# Patient Record
Sex: Female | Born: 1950 | Race: Black or African American | Hispanic: No | Smoking: Never smoker
Health system: Southern US, Community
[De-identification: ages and names within clinical notes are randomized; demographics above are authoritative.]

## PROBLEM LIST (undated history)

## (undated) DIAGNOSIS — N7011 Chronic salpingitis: Secondary | ICD-10-CM

## (undated) DIAGNOSIS — M329 Systemic lupus erythematosus, unspecified: Secondary | ICD-10-CM

## (undated) DIAGNOSIS — I7300 Raynaud's syndrome without gangrene: Secondary | ICD-10-CM

## (undated) DIAGNOSIS — G4700 Insomnia, unspecified: Secondary | ICD-10-CM

## (undated) DIAGNOSIS — M810 Age-related osteoporosis without current pathological fracture: Secondary | ICD-10-CM

## (undated) DIAGNOSIS — M4800 Spinal stenosis, site unspecified: Secondary | ICD-10-CM

## (undated) DIAGNOSIS — IMO0002 Reserved for concepts with insufficient information to code with codable children: Secondary | ICD-10-CM

## (undated) DIAGNOSIS — K589 Irritable bowel syndrome without diarrhea: Secondary | ICD-10-CM

## (undated) DIAGNOSIS — B009 Herpesviral infection, unspecified: Secondary | ICD-10-CM

## (undated) DIAGNOSIS — I10 Essential (primary) hypertension: Secondary | ICD-10-CM

## (undated) DIAGNOSIS — R7303 Prediabetes: Secondary | ICD-10-CM

## (undated) HISTORY — PX: FOOT SURGERY: SHX648

## (undated) HISTORY — DX: Reserved for concepts with insufficient information to code with codable children: IMO0002

## (undated) HISTORY — DX: Chronic salpingitis: N70.11

## (undated) HISTORY — DX: Essential (primary) hypertension: I10

## (undated) HISTORY — DX: Raynaud's syndrome without gangrene: I73.00

## (undated) HISTORY — DX: Systemic lupus erythematosus, unspecified: M32.9

## (undated) HISTORY — DX: Irritable bowel syndrome, unspecified: K58.9

## (undated) HISTORY — DX: Prediabetes: R73.03

## (undated) HISTORY — DX: Spinal stenosis, site unspecified: M48.00

## (undated) HISTORY — DX: Insomnia, unspecified: G47.00

## (undated) HISTORY — DX: Herpesviral infection, unspecified: B00.9

## (undated) HISTORY — DX: Age-related osteoporosis without current pathological fracture: M81.0

---

## 1976-07-21 HISTORY — PX: TUBAL LIGATION: SHX77

## 1985-07-21 HISTORY — PX: OTHER SURGICAL HISTORY: SHX169

## 1985-07-21 HISTORY — PX: ABDOMINAL HYSTERECTOMY: SHX81

## 1998-01-08 ENCOUNTER — Other Ambulatory Visit: Admission: RE | Admit: 1998-01-08 | Discharge: 1998-01-08 | Payer: Self-pay | Admitting: Obstetrics and Gynecology

## 1998-03-27 ENCOUNTER — Ambulatory Visit (HOSPITAL_COMMUNITY): Admission: RE | Admit: 1998-03-27 | Discharge: 1998-03-27 | Payer: Self-pay | Admitting: Internal Medicine

## 1998-05-11 ENCOUNTER — Ambulatory Visit (HOSPITAL_COMMUNITY): Admission: RE | Admit: 1998-05-11 | Discharge: 1998-05-11 | Payer: Self-pay | Admitting: Internal Medicine

## 1998-09-27 ENCOUNTER — Other Ambulatory Visit: Admission: RE | Admit: 1998-09-27 | Discharge: 1998-09-27 | Payer: Self-pay | Admitting: Obstetrics and Gynecology

## 1999-06-12 ENCOUNTER — Encounter: Admission: RE | Admit: 1999-06-12 | Discharge: 1999-06-12 | Payer: Self-pay | Admitting: Internal Medicine

## 1999-06-12 ENCOUNTER — Encounter: Payer: Self-pay | Admitting: Internal Medicine

## 1999-10-01 ENCOUNTER — Other Ambulatory Visit: Admission: RE | Admit: 1999-10-01 | Discharge: 1999-10-01 | Payer: Self-pay | Admitting: Obstetrics and Gynecology

## 2000-06-18 ENCOUNTER — Encounter: Payer: Self-pay | Admitting: Internal Medicine

## 2000-06-18 ENCOUNTER — Encounter: Admission: RE | Admit: 2000-06-18 | Discharge: 2000-06-18 | Payer: Self-pay | Admitting: Internal Medicine

## 2000-09-11 ENCOUNTER — Ambulatory Visit (HOSPITAL_COMMUNITY): Admission: RE | Admit: 2000-09-11 | Discharge: 2000-09-11 | Payer: Self-pay | Admitting: Internal Medicine

## 2000-10-15 ENCOUNTER — Encounter: Payer: Self-pay | Admitting: Internal Medicine

## 2000-10-15 ENCOUNTER — Other Ambulatory Visit: Admission: RE | Admit: 2000-10-15 | Discharge: 2000-10-15 | Payer: Self-pay | Admitting: Obstetrics and Gynecology

## 2000-10-15 ENCOUNTER — Ambulatory Visit (HOSPITAL_COMMUNITY): Admission: RE | Admit: 2000-10-15 | Discharge: 2000-10-15 | Payer: Self-pay | Admitting: Internal Medicine

## 2000-12-02 ENCOUNTER — Ambulatory Visit (HOSPITAL_COMMUNITY): Admission: RE | Admit: 2000-12-02 | Discharge: 2000-12-02 | Payer: Self-pay | Admitting: Orthopedic Surgery

## 2000-12-02 ENCOUNTER — Encounter: Payer: Self-pay | Admitting: Orthopedic Surgery

## 2001-11-02 ENCOUNTER — Other Ambulatory Visit: Admission: RE | Admit: 2001-11-02 | Discharge: 2001-11-02 | Payer: Self-pay | Admitting: Obstetrics and Gynecology

## 2002-08-18 ENCOUNTER — Encounter: Payer: Self-pay | Admitting: Internal Medicine

## 2002-08-18 ENCOUNTER — Encounter: Admission: RE | Admit: 2002-08-18 | Discharge: 2002-08-18 | Payer: Self-pay | Admitting: Internal Medicine

## 2002-10-26 ENCOUNTER — Encounter: Admission: RE | Admit: 2002-10-26 | Discharge: 2002-10-26 | Payer: Self-pay | Admitting: Internal Medicine

## 2002-10-26 ENCOUNTER — Encounter: Payer: Self-pay | Admitting: Internal Medicine

## 2002-11-08 ENCOUNTER — Other Ambulatory Visit: Admission: RE | Admit: 2002-11-08 | Discharge: 2002-11-08 | Payer: Self-pay | Admitting: Obstetrics and Gynecology

## 2003-11-23 ENCOUNTER — Other Ambulatory Visit: Admission: RE | Admit: 2003-11-23 | Discharge: 2003-11-23 | Payer: Self-pay | Admitting: Obstetrics and Gynecology

## 2004-06-07 ENCOUNTER — Emergency Department (HOSPITAL_COMMUNITY): Admission: EM | Admit: 2004-06-07 | Discharge: 2004-06-08 | Payer: Self-pay | Admitting: Emergency Medicine

## 2004-07-21 HISTORY — PX: ROTATOR CUFF REPAIR: SHX139

## 2004-12-12 ENCOUNTER — Other Ambulatory Visit: Admission: RE | Admit: 2004-12-12 | Discharge: 2004-12-12 | Payer: Self-pay | Admitting: Addiction Medicine

## 2006-01-14 ENCOUNTER — Other Ambulatory Visit: Admission: RE | Admit: 2006-01-14 | Discharge: 2006-01-14 | Payer: Self-pay | Admitting: Obstetrics and Gynecology

## 2006-07-08 ENCOUNTER — Encounter: Admission: RE | Admit: 2006-07-08 | Discharge: 2006-07-08 | Payer: Self-pay | Admitting: Gastroenterology

## 2006-12-07 ENCOUNTER — Encounter: Admission: RE | Admit: 2006-12-07 | Discharge: 2006-12-07 | Payer: Self-pay | Admitting: Gastroenterology

## 2007-01-18 ENCOUNTER — Other Ambulatory Visit: Admission: RE | Admit: 2007-01-18 | Discharge: 2007-01-18 | Payer: Self-pay | Admitting: Obstetrics and Gynecology

## 2007-08-22 HISTORY — PX: OTHER SURGICAL HISTORY: SHX169

## 2007-09-09 ENCOUNTER — Encounter: Admission: RE | Admit: 2007-09-09 | Discharge: 2007-09-09 | Payer: Self-pay | Admitting: Orthopaedic Surgery

## 2008-02-16 ENCOUNTER — Other Ambulatory Visit: Admission: RE | Admit: 2008-02-16 | Discharge: 2008-02-16 | Payer: Self-pay | Admitting: Obstetrics and Gynecology

## 2008-06-28 ENCOUNTER — Encounter: Admission: RE | Admit: 2008-06-28 | Discharge: 2008-06-28 | Payer: Self-pay | Admitting: Internal Medicine

## 2008-07-21 HISTORY — PX: FOOT SURGERY: SHX648

## 2008-07-21 HISTORY — PX: OTHER SURGICAL HISTORY: SHX169

## 2008-10-03 ENCOUNTER — Ambulatory Visit (HOSPITAL_BASED_OUTPATIENT_CLINIC_OR_DEPARTMENT_OTHER): Admission: RE | Admit: 2008-10-03 | Discharge: 2008-10-03 | Payer: Self-pay | Admitting: Orthopaedic Surgery

## 2009-04-04 ENCOUNTER — Ambulatory Visit: Payer: Self-pay | Admitting: Obstetrics and Gynecology

## 2009-04-04 ENCOUNTER — Encounter: Payer: Self-pay | Admitting: Obstetrics and Gynecology

## 2009-04-04 ENCOUNTER — Other Ambulatory Visit: Admission: RE | Admit: 2009-04-04 | Discharge: 2009-04-04 | Payer: Self-pay | Admitting: Obstetrics and Gynecology

## 2009-08-23 ENCOUNTER — Encounter: Admission: RE | Admit: 2009-08-23 | Discharge: 2009-08-23 | Payer: Self-pay | Admitting: Internal Medicine

## 2009-10-04 ENCOUNTER — Encounter: Admission: RE | Admit: 2009-10-04 | Discharge: 2009-10-04 | Payer: Self-pay | Admitting: Internal Medicine

## 2009-10-30 ENCOUNTER — Ambulatory Visit: Payer: Self-pay | Admitting: Obstetrics and Gynecology

## 2009-11-22 ENCOUNTER — Ambulatory Visit: Payer: Self-pay | Admitting: Obstetrics and Gynecology

## 2010-04-08 ENCOUNTER — Ambulatory Visit: Payer: Self-pay | Admitting: Obstetrics and Gynecology

## 2010-04-09 ENCOUNTER — Other Ambulatory Visit: Admission: RE | Admit: 2010-04-09 | Discharge: 2010-04-09 | Payer: Self-pay | Admitting: Obstetrics and Gynecology

## 2010-04-09 ENCOUNTER — Ambulatory Visit: Payer: Self-pay | Admitting: Obstetrics and Gynecology

## 2010-09-25 ENCOUNTER — Ambulatory Visit (INDEPENDENT_AMBULATORY_CARE_PROVIDER_SITE_OTHER): Payer: Managed Care, Other (non HMO) | Admitting: Obstetrics and Gynecology

## 2010-09-25 ENCOUNTER — Other Ambulatory Visit: Payer: Managed Care, Other (non HMO)

## 2010-09-25 DIAGNOSIS — D3910 Neoplasm of uncertain behavior of unspecified ovary: Secondary | ICD-10-CM

## 2010-09-25 DIAGNOSIS — N7013 Chronic salpingitis and oophoritis: Secondary | ICD-10-CM

## 2010-10-31 LAB — BASIC METABOLIC PANEL
Chloride: 103 mEq/L (ref 96–112)
Creatinine, Ser: 0.94 mg/dL (ref 0.4–1.2)
Potassium: 4.1 mEq/L (ref 3.5–5.1)
Sodium: 138 mEq/L (ref 135–145)

## 2010-10-31 LAB — POCT HEMOGLOBIN-HEMACUE: Hemoglobin: 12.9 g/dL (ref 12.0–15.0)

## 2010-12-03 NOTE — Op Note (Signed)
NAME:  Theresa Graham, Theresa Graham                 ACCOUNT NO.:  [id number]   MEDICAL RECORD NO.:  [id number]          PATIENT TYPE:  AMB   LOCATION:  [facility]                          FACILITY:  [facility]   PHYSICIAN:  Lubertha Basque. Dalldorf, M.D.DATE OF BIRTH:  [birth year]   DATE OF PROCEDURE:  10/03/2008  DATE OF DISCHARGE:                               OPERATIVE REPORT   PREOPERATIVE DIAGNOSIS:  Left shoulder impingement.   POSTOPERATIVE DIAGNOSIS:  Left shoulder impingement.   PROCEDURE:  1. Left shoulder arthroscopic acromioplasty.  2. Left shoulder arthroscopic partial claviculectomy.  3. Left shoulder arthroscopic debridement.   ANESTHESIA:  General.   ATTENDING SURGEON:  Lubertha Basque. Jerl Santos, MD   ASSISTANT:  Lindwood Qua, PA   INDICATIONS FOR PROCEDURE:  The patient is a 60 year old woman with a  long history of left shoulder pain.  This has persisted despite oral  anti-inflammatories and an exercise program as well as a subacromial  injection, which did afford her transient relief.  By MRI scan, shows  some irritation of rotator cuff, but no full thickness tear.  She has  pain which limits her ability to rest and use her arm and she at this  point is offered an arthroscopy.  Informed operative consent was  obtained after discussion of possible complications including reaction  to anesthesia and infection.   SUMMARY/FINDINGS AND PROCEDURE:  Under general anesthesia, an  arthroscopy of the left shoulder was performed.  The glenohumeral joint  has had no degenerative changes and the biceps tendon, rotator cuff were  benign from below.  In the subacromial space, she had some bursitis  consistent with impingement.  We performed an acromioplasty and a  partial claviculectomy and debridement of the cuff.   DESCRIPTION OF PROCEDURE:  The patient was taken to operating suite  where general anesthetic was applied without difficulty.  She was  positioned in beach chair position, and prepped and draped  in normal  sterile fashion.  After the administration of IV Kefzol, an arthroscopy  of the left shoulder was performed through a total of 2 portals.  Findings were as noted above.  Procedure consisted of the debridement of  the bursal aspect of the cuff followed by decompression.  We performed  an acromioplasty with a bur in the lateral position, followed by  transfer of the bur to the posterior position.  I performed a partial  claviculectomy to the undersurface of the bone in a similar fashion.  The shoulder was thoroughly irrigated, followed by reapproximation of  portals loosely with nylon and placement of some Marcaine with  epinephrine and morphine in the subacromial space.  Adaptic was applied  followed by dry-gauze and tape.  Estimated blood loss and intraoperative  fluids obtained from anesthesia records.   DISPOSITION:  The patient was extubated in the operating room and taken  to recovery room in stable condition.  She was to go home same day and  follow up with me in my office next week.  I will contact her by phone  tonight.      Lubertha Basque Jerl Santos, M.D.  Electronically Signed     PGD/MEDQ  D:  10/03/2008  T:  10/04/2008  Job:  [id number]

## 2011-04-09 ENCOUNTER — Encounter: Payer: Self-pay | Admitting: Obstetrics and Gynecology

## 2011-04-10 DIAGNOSIS — R51 Headache: Secondary | ICD-10-CM | POA: Insufficient documentation

## 2011-04-10 DIAGNOSIS — B009 Herpesviral infection, unspecified: Secondary | ICD-10-CM | POA: Insufficient documentation

## 2011-04-10 DIAGNOSIS — R519 Headache, unspecified: Secondary | ICD-10-CM | POA: Insufficient documentation

## 2011-04-16 ENCOUNTER — Other Ambulatory Visit: Payer: Self-pay | Admitting: Obstetrics and Gynecology

## 2011-04-16 ENCOUNTER — Ambulatory Visit: Admission: RE | Admit: 2011-04-16 | Payer: Managed Care, Other (non HMO) | Source: Ambulatory Visit

## 2011-04-16 ENCOUNTER — Ambulatory Visit (INDEPENDENT_AMBULATORY_CARE_PROVIDER_SITE_OTHER): Payer: Managed Care, Other (non HMO) | Admitting: Obstetrics and Gynecology

## 2011-04-16 DIAGNOSIS — N83209 Unspecified ovarian cyst, unspecified side: Secondary | ICD-10-CM

## 2011-04-16 DIAGNOSIS — N949 Unspecified condition associated with female genital organs and menstrual cycle: Secondary | ICD-10-CM

## 2011-04-16 NOTE — Progress Notes (Signed)
Patient came back today for a followup ultrasound because of the cystic area in her right adnexa. Her uterus is surgically absent. Her right adnexa shows a cystic echo-free mass of 1.3 cm. I think it's most consistent with a small hydrosalpinx. It is unchanged from previous scan. The patient has an echogenic 1 mm focus in the wall of her right ovary which I believe is just innocent calcium. Her left ovary is atrophic. Her cul-de-sac is free of fluid. The hydrosalpinx is avascular.  Assessment: Right hydrosalpinx  Plan: Patient reassured. Her original symptoms which warranted the ultrasound have resolved. We will do a followup scan in one year.

## 2011-04-23 ENCOUNTER — Ambulatory Visit (INDEPENDENT_AMBULATORY_CARE_PROVIDER_SITE_OTHER): Payer: Managed Care, Other (non HMO) | Admitting: Obstetrics and Gynecology

## 2011-04-23 ENCOUNTER — Encounter: Payer: Self-pay | Admitting: Obstetrics and Gynecology

## 2011-04-23 ENCOUNTER — Other Ambulatory Visit (HOSPITAL_COMMUNITY)
Admission: RE | Admit: 2011-04-23 | Discharge: 2011-04-23 | Disposition: A | Discharge status: Home / Self Care | Payer: Managed Care, Other (non HMO) | Source: Ambulatory Visit | Attending: Obstetrics and Gynecology | Admitting: Obstetrics and Gynecology

## 2011-04-23 VITALS — BP 120/64 | Ht 64.5 in | Wt 137.0 lb

## 2011-04-23 DIAGNOSIS — N952 Postmenopausal atrophic vaginitis: Secondary | ICD-10-CM

## 2011-04-23 DIAGNOSIS — Z01419 Encounter for gynecological examination (general) (routine) without abnormal findings: Secondary | ICD-10-CM

## 2011-04-23 DIAGNOSIS — B009 Herpesviral infection, unspecified: Secondary | ICD-10-CM

## 2011-04-23 MED ORDER — VALACYCLOVIR HCL 500 MG PO TABS: 500.0000 mg | ORAL_TABLET | Freq: Every day | ORAL | Status: DC

## 2011-04-23 MED ORDER — ESTRADIOL 0.1 MG/GM VA CREA: 1.0000 g | TOPICAL_CREAM | VAGINAL | Status: DC

## 2011-04-23 NOTE — Progress Notes (Signed)
Patient came to see me today for her annual GYN exam. She has been on generic Valtrex for over a year now. She uses it daily. She has had no recurrences. She wants to know if she can go to every other day. She is up-to-date on mammograms. She is due for bone density next year. She does have osteopenia. She's had no fractures. She uses Estrace cream once a week for vaginal dryness with excellent results. She is having no vaginal bleeding.  HEENT: Within normal limits. Neck: No masses. Supraclavicular lymph nodes: Not enlarged. Breasts: Examined in both sitting and lying position. Symmetrical without skin changes or masses. Abdomen: Soft no masses guarding or rebound. No hernias. Pelvic: External within normal limits. BUS within normal limits. Vaginal examination shows good estrogen effect, no cystocele enterocele or rectocele. Cervix and uterus absent. Adnexa within normal limits. Rectovaginal confirmatory. Extremities within normal limits.   Assessment: Atrophic vaginitis, HSV,osteopenia.  Plan: Continue a mammograms. Bone density next year. Continue Valtrex 500 mg daily. Patient may back off to every other day.  Continue Estrace vaginal cream.

## 2011-07-02 ENCOUNTER — Other Ambulatory Visit: Payer: Self-pay | Admitting: Dermatology

## 2011-08-21 ENCOUNTER — Other Ambulatory Visit: Payer: Self-pay | Admitting: *Deleted

## 2011-08-21 MED ORDER — VALACYCLOVIR HCL 500 MG PO TABS: 500.0000 mg | ORAL_TABLET | Freq: Every day | ORAL | Status: DC

## 2011-08-21 NOTE — Progress Notes (Signed)
Pharm requested 90 d rx

## 2012-04-22 ENCOUNTER — Encounter: Payer: Self-pay | Admitting: Obstetrics and Gynecology

## 2012-04-26 ENCOUNTER — Encounter: Payer: Self-pay | Admitting: Obstetrics and Gynecology

## 2012-05-03 ENCOUNTER — Telehealth: Payer: Self-pay | Admitting: *Deleted

## 2012-05-03 DIAGNOSIS — N83209 Unspecified ovarian cyst, unspecified side: Secondary | ICD-10-CM

## 2012-05-03 NOTE — Telephone Encounter (Signed)
Order placed

## 2012-05-03 NOTE — Addendum Note (Signed)
Addended by: Aura Camps on: 05/03/2012 11:53 AM   Modules accepted: Orders

## 2012-05-03 NOTE — Telephone Encounter (Signed)
If she is not Medicare they both can be done the same day.

## 2012-05-03 NOTE — Telephone Encounter (Signed)
Pt has annual scheduled for 05/05/12. Pt asked if ultrasound same day okay to check for cyst on her ovary? Last ultrasound done back in sept. 2012. Please advise

## 2012-05-05 ENCOUNTER — Encounter: Payer: Self-pay | Admitting: Obstetrics and Gynecology

## 2012-05-05 ENCOUNTER — Ambulatory Visit (INDEPENDENT_AMBULATORY_CARE_PROVIDER_SITE_OTHER): Payer: Managed Care, Other (non HMO) | Admitting: Obstetrics and Gynecology

## 2012-05-05 VITALS — BP 120/76 | Ht 64.5 in | Wt 137.0 lb

## 2012-05-05 DIAGNOSIS — Z01419 Encounter for gynecological examination (general) (routine) without abnormal findings: Secondary | ICD-10-CM

## 2012-05-05 DIAGNOSIS — I10 Essential (primary) hypertension: Secondary | ICD-10-CM | POA: Insufficient documentation

## 2012-05-05 DIAGNOSIS — Z23 Encounter for immunization: Secondary | ICD-10-CM

## 2012-05-05 DIAGNOSIS — N952 Postmenopausal atrophic vaginitis: Secondary | ICD-10-CM

## 2012-05-05 MED ORDER — ESTRADIOL 0.1 MG/GM VA CREA: 1.0000 g | TOPICAL_CREAM | VAGINAL | Status: DC

## 2012-05-05 MED ORDER — VALACYCLOVIR HCL 500 MG PO TABS: 500.0000 mg | ORAL_TABLET | Freq: Every day | ORAL | Status: DC

## 2012-05-05 NOTE — Patient Instructions (Signed)
Return for ultrasound

## 2012-05-05 NOTE — Addendum Note (Signed)
Addended by: Dayna Barker on: 05/05/2012 02:59 PM   Modules accepted: Orders

## 2012-05-05 NOTE — Progress Notes (Signed)
Patient came to see me today for her annual GYN exam. She continues use estrogen cream for vaginal dryness. It works  well when she uses it but she frequently forgets. She is having no vaginal bleeding. She is having no pelvic pain. She has always had normal Pap smears. Her last Pap smear was 2012. In 1987 I did a TAH for fibroids. She has both her ovaries. She takes Valtrex daily for HSV. Recently she has missed days and she actually  had a recurrence 6 weeks ago. She has osteopenia. She is on drug holiday from Fosamax. She has been on drug holiday for 4-5 years. Her last bone density this year continue to show osteopenia. Her thinnest measurements was  in the hip and this is stable. Her spine which is normal did show some loss of bone. She's had no fractures. We have been watching her with a right adnexal mass which we suspect is a hydrosalpinx. She also has a small echogenic focus on her right ovary. She is due for  follow up ultrasound. She does her lab through PCP.  HEENT: Within normal limits.Kennon Portela present. Neck: No masses. Supraclavicular lymph nodes: Not enlarged. Breasts: Examined in both sitting and lying position. Symmetrical without skin changes or masses. Abdomen: Soft no masses guarding or rebound. No hernias. Pelvic: External within normal limits. BUS within normal limits. Vaginal examination shows good estrogen effect, no cystocele enterocele or rectocele. Cervix and uterus absent. Adnexa within normal limits. Rectovaginal confirmatory. Extremities within normal limits.  Assessment: #1. Right adnexal mass-nonsuspicious #2. Atrophic vaginitis #3. HSV  Plan: Pelvic ultrasound. Take Valtrex daily. Continue Estrace vaginal cream. Pap not done.The new Pap smear guidelines were discussed with the patient.

## 2012-05-06 LAB — URINALYSIS W MICROSCOPIC + REFLEX CULTURE
Bilirubin Urine: NEGATIVE
Casts: NONE SEEN
Crystals: NONE SEEN
Glucose, UA: NEGATIVE mg/dL
Specific Gravity, Urine: 1.016 (ref 1.005–1.030)
Squamous Epithelial / LPF: NONE SEEN
pH: 7.5 (ref 5.0–8.0)

## 2012-05-19 ENCOUNTER — Ambulatory Visit (INDEPENDENT_AMBULATORY_CARE_PROVIDER_SITE_OTHER): Payer: Managed Care, Other (non HMO) | Admitting: Obstetrics and Gynecology

## 2012-05-19 ENCOUNTER — Ambulatory Visit (INDEPENDENT_AMBULATORY_CARE_PROVIDER_SITE_OTHER): Payer: Managed Care, Other (non HMO)

## 2012-05-19 DIAGNOSIS — N83339 Acquired atrophy of ovary and fallopian tube, unspecified side: Secondary | ICD-10-CM

## 2012-05-19 DIAGNOSIS — N7013 Chronic salpingitis and oophoritis: Secondary | ICD-10-CM

## 2012-05-19 DIAGNOSIS — N7011 Chronic salpingitis: Secondary | ICD-10-CM

## 2012-05-19 DIAGNOSIS — N83209 Unspecified ovarian cyst, unspecified side: Secondary | ICD-10-CM

## 2012-05-19 NOTE — Progress Notes (Signed)
Patient came to see me today for follow up ultrasound. She is status post hysterectomy. Her right adnexa continues to show a thin walled C-shaped echo-free cyst of 1.2 cm. It is stable. It is avascular. Her left ovary is normal. She continues to have an echogenic focus in the wall of the right ovary of 1 mm. which is unchanged in size. Her cul-de-sac is free of fluid.  Assessment: Right hydrosalpinx  Plan: Patient reassured.

## 2012-05-19 NOTE — Patient Instructions (Signed)
Return in one year.

## 2012-12-20 ENCOUNTER — Ambulatory Visit: Payer: Managed Care, Other (non HMO) | Admitting: Cardiology

## 2013-02-09 ENCOUNTER — Other Ambulatory Visit: Payer: Self-pay | Admitting: Dermatology

## 2013-03-23 ENCOUNTER — Other Ambulatory Visit: Payer: Self-pay | Admitting: Gynecology

## 2013-03-23 DIAGNOSIS — N7011 Chronic salpingitis: Secondary | ICD-10-CM

## 2013-05-03 ENCOUNTER — Encounter: Payer: Self-pay | Admitting: Gynecology

## 2013-05-09 ENCOUNTER — Other Ambulatory Visit: Payer: Self-pay

## 2013-05-09 MED ORDER — VALACYCLOVIR HCL 500 MG PO TABS: 500.0000 mg | ORAL_TABLET | Freq: Every day | ORAL | Status: DC

## 2013-05-09 NOTE — Telephone Encounter (Signed)
Has CE scheduled 05/13/13 with Dr. Velvet Bathe.

## 2013-05-11 ENCOUNTER — Encounter: Payer: Self-pay | Admitting: Gynecology

## 2013-05-13 ENCOUNTER — Ambulatory Visit (INDEPENDENT_AMBULATORY_CARE_PROVIDER_SITE_OTHER): Payer: Managed Care, Other (non HMO) | Admitting: Gynecology

## 2013-05-13 ENCOUNTER — Encounter: Payer: Self-pay | Admitting: Gynecology

## 2013-05-13 VITALS — BP 120/78 | Ht 65.0 in | Wt 135.0 lb

## 2013-05-13 DIAGNOSIS — Z23 Encounter for immunization: Secondary | ICD-10-CM

## 2013-05-13 DIAGNOSIS — A609 Anogenital herpesviral infection, unspecified: Secondary | ICD-10-CM

## 2013-05-13 DIAGNOSIS — M899 Disorder of bone, unspecified: Secondary | ICD-10-CM

## 2013-05-13 DIAGNOSIS — M8580 Other specified disorders of bone density and structure, unspecified site: Secondary | ICD-10-CM

## 2013-05-13 DIAGNOSIS — N7011 Chronic salpingitis: Secondary | ICD-10-CM

## 2013-05-13 DIAGNOSIS — N7013 Chronic salpingitis and oophoritis: Secondary | ICD-10-CM

## 2013-05-13 DIAGNOSIS — N952 Postmenopausal atrophic vaginitis: Secondary | ICD-10-CM

## 2013-05-13 DIAGNOSIS — B009 Herpesviral infection, unspecified: Secondary | ICD-10-CM

## 2013-05-13 DIAGNOSIS — Z01419 Encounter for gynecological examination (general) (routine) without abnormal findings: Secondary | ICD-10-CM

## 2013-05-13 MED ORDER — ESTRADIOL 0.1 MG/GM VA CREA: 1.0000 g | TOPICAL_CREAM | VAGINAL | Status: DC

## 2013-05-13 NOTE — Progress Notes (Signed)
Theresa Graham 01/21/1951 [id number]        62 y.o.  G1P1001 for annual exam.  Former patient of Dr. Eda Paschal. Several issues noted below.  Past medical history,surgical history, medications, allergies, family history and social history were all reviewed and documented in the EPIC chart.  ROS:  Performed and pertinent positives and negatives are included in the history, assessment and plan .  Exam: Kim assistant Filed Vitals:   05/13/13 0802  BP: 120/78  Height: 5\' 5"  (1.651 m)  Weight: 135 lb (61.236 kg)   General appearance  Normal Skin grossly normal Head/Neck normal with no cervical or supraclavicular adenopathy thyroid normal Lungs  clear Cardiac RR, without RMG Abdominal  soft, nontender, without masses, organomegaly or hernia Breasts  examined lying and sitting without masses, retractions, discharge or axillary adenopathy. Pelvic  Ext/BUS/vagina  normal with atrophic changes  Adnexa  Without masses or tenderness    Anus and perineum  normal   Rectovaginal  normal sphincter tone without palpated masses or tenderness.    Assessment/Plan:  62 y.o. G37P1001 female for annual exam.   1. Postmenopausal/atrophic genital changes. Patient using Estrace vaginal cream for vaginal dryness, usually one applicator weekly. Has good results with this. I reviewed the issues of vaginal estrogen and possible absorption. Risks of stroke heart attack DVT and breast cancer issues reviewed. Limited absorption with vaginal cream discussed. Patient's comfortable continuing and I refilled her x1 year. 2. Probable right hydrosalpinx. Patient being followed with annual ultrasounds with last ultrasound showing a 1.2 cm mean avascular simple echo-free C-shaped right adnexal cystic mass consistent with a small hydrosalpinx. In review of her records this has dated back to at least 2008 where it measured 1.4 cm. I reviewed with her the stability over a years observation and the options to stop screening at this  point as she is asymptomatic. We discussed I cannot guarantee that is not cancer but highly unlikely. Patient's comfortable with not screening this year but asked if she could have an ultrasound next year just for reassurance I think that is certainly reasonable. 3. History of genital HSV. On Valtrex daily suppression for years. Has not had an outbreak for years. Options to stop now or continue reviewed. Patient would prefer to stop suppressive medication. I discussed treatment with Valtrex 500 mg twice a day x3-5 days at the earliest onset she does have a recurrence. She already has a supply at home. 4. Osteopenia. DEXA 04/2012 with T score -1.6. FRAX 3.1%/0.1% noting that she does have a prior bisphosphonate treatment. She was on Fosamax for approximately 5-6 years but has been off of it for 5 years. Most recent DEXA stable from prior studies. Will repeat DEXA next year at two-year interval. If stable then discussed less frequent screening. Vitamin D through her other doctor recently measured at 48. 5. Pap smear 2012. No Pap smear done today. Status post TAH 1987 for leiomyoma. No history of abnormal Pap smears previously. Options to stop screening altogether or less frequent screening intervals reviewed. We'll readdress on an annual basis. 6. Mammography 04/2013. Continue with annual mammography. SBE monthly reviewed. 7. Colonoscopy 2007 with plans to repeat at 10 year interval. 8. Health maintenance. No blood work done as this is all done through her primary physician's office. Followup one year, sooner as needed.  Note: This document was prepared with digital dictation and possible smart phrase technology. Any transcriptional errors that result from this process are unintentional.   Dara Lords MD, 8:52 AM  05/13/2013   

## 2013-05-13 NOTE — Patient Instructions (Signed)
Followup in one year for annual exam, sooner as needed. 

## 2013-05-25 ENCOUNTER — Other Ambulatory Visit: Payer: Managed Care, Other (non HMO)

## 2013-05-25 ENCOUNTER — Ambulatory Visit: Payer: Managed Care, Other (non HMO) | Admitting: Gynecology

## 2013-06-08 ENCOUNTER — Ambulatory Visit: Payer: Self-pay | Admitting: Gynecology

## 2013-06-08 ENCOUNTER — Other Ambulatory Visit: Payer: Self-pay

## 2013-06-21 ENCOUNTER — Ambulatory Visit: Payer: Managed Care, Other (non HMO)

## 2013-06-21 ENCOUNTER — Ambulatory Visit (INDEPENDENT_AMBULATORY_CARE_PROVIDER_SITE_OTHER): Payer: Managed Care, Other (non HMO) | Admitting: Family Medicine

## 2013-06-21 DIAGNOSIS — M79609 Pain in unspecified limb: Secondary | ICD-10-CM

## 2013-06-21 DIAGNOSIS — R51 Headache: Secondary | ICD-10-CM

## 2013-06-21 DIAGNOSIS — M329 Systemic lupus erythematosus, unspecified: Secondary | ICD-10-CM

## 2013-06-21 MED ORDER — HYDROCODONE-ACETAMINOPHEN 5-325 MG PO TABS: 1.0000 | ORAL_TABLET | Freq: Four times a day (QID) | ORAL | Status: DC | PRN
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
## 2013-06-22 ENCOUNTER — Telehealth: Payer: Self-pay

## 2013-06-22 NOTE — Telephone Encounter (Signed)
Sure, resume compression stockings
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
## 2013-06-22 NOTE — Telephone Encounter (Signed)
Please advise, are these for her comfort? Is it okay to remove and use stockings?

## 2013-06-22 NOTE — Telephone Encounter (Signed)
Called to advise.  

## 2013-06-28 ENCOUNTER — Ambulatory Visit (INDEPENDENT_AMBULATORY_CARE_PROVIDER_SITE_OTHER): Payer: Managed Care, Other (non HMO) | Admitting: Emergency Medicine

## 2013-06-28 ENCOUNTER — Ambulatory Visit: Payer: Managed Care, Other (non HMO)

## 2013-06-28 VITALS — BP 115/72 | HR 80 | Temp 98.2°F | Resp 18 | Wt 141.0 lb

## 2013-06-28 DIAGNOSIS — M542 Cervicalgia: Secondary | ICD-10-CM

## 2013-06-28 DIAGNOSIS — IMO0002 Reserved for concepts with insufficient information to code with codable children: Secondary | ICD-10-CM

## 2013-06-28 DIAGNOSIS — S8012XS Contusion of left lower leg, sequela: Secondary | ICD-10-CM

## 2013-06-28 DIAGNOSIS — M79609 Pain in unspecified limb: Secondary | ICD-10-CM

## 2013-06-28 MED ORDER — NAPROXEN SODIUM 550 MG PO TABS: 550.0000 mg | ORAL_TABLET | Freq: Two times a day (BID) | ORAL | Status: DC

## 2013-06-28 MED ORDER — CYCLOBENZAPRINE HCL 10 MG PO TABS: 10.0000 mg | ORAL_TABLET | Freq: Three times a day (TID) | ORAL | Status: DC | PRN

## 2013-06-28 NOTE — Patient Instructions (Signed)
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
## 2013-10-24 ENCOUNTER — Telehealth: Payer: Self-pay

## 2013-10-24 NOTE — Telephone Encounter (Signed)
Estill Bamberg from [facility] left voicemail in medical records checking status on request sent a few months ago. Cb# [phone]. Will forward to Jasmine's desk.

## 2014-01-25 ENCOUNTER — Ambulatory Visit
Admission: RE | Admit: 2014-01-25 | Discharge: 2014-01-25 | Disposition: A | Discharge status: Home / Self Care | Payer: Managed Care, Other (non HMO) | Source: Ambulatory Visit | Attending: Family Medicine | Admitting: Family Medicine

## 2014-01-25 ENCOUNTER — Other Ambulatory Visit: Payer: Self-pay | Admitting: Family Medicine

## 2014-01-25 DIAGNOSIS — M79609 Pain in unspecified limb: Secondary | ICD-10-CM

## 2014-03-13 ENCOUNTER — Ambulatory Visit (INDEPENDENT_AMBULATORY_CARE_PROVIDER_SITE_OTHER): Payer: Managed Care, Other (non HMO) | Admitting: Family Medicine

## 2014-03-13 ENCOUNTER — Ambulatory Visit (INDEPENDENT_AMBULATORY_CARE_PROVIDER_SITE_OTHER): Payer: Managed Care, Other (non HMO)

## 2014-03-13 VITALS — BP 118/72 | HR 75 | Temp 97.5°F | Resp 16 | Ht 64.5 in | Wt 143.2 lb

## 2014-03-13 DIAGNOSIS — R8281 Pyuria: Secondary | ICD-10-CM

## 2014-03-13 DIAGNOSIS — R109 Unspecified abdominal pain: Secondary | ICD-10-CM

## 2014-03-13 DIAGNOSIS — W19XXXA Unspecified fall, initial encounter: Secondary | ICD-10-CM

## 2014-03-13 DIAGNOSIS — R82998 Other abnormal findings in urine: Secondary | ICD-10-CM

## 2014-03-13 LAB — POCT URINALYSIS DIPSTICK
Bilirubin, UA: NEGATIVE
Blood, UA: NEGATIVE
Glucose, UA: NEGATIVE
Ketones, UA: NEGATIVE
Nitrite, UA: NEGATIVE
Protein, UA: NEGATIVE
Spec Grav, UA: 1.01
Urobilinogen, UA: 0.2
pH, UA: 7

## 2014-03-13 MED ORDER — CIPROFLOXACIN HCL 250 MG PO TABS: 250.0000 mg | ORAL_TABLET | Freq: Two times a day (BID) | ORAL | Status: DC

## 2014-03-13 MED ORDER — DICLOFENAC SODIUM 75 MG PO TBEC: 75.0000 mg | DELAYED_RELEASE_TABLET | Freq: Two times a day (BID) | ORAL | Status: DC

## 2014-03-13 NOTE — Progress Notes (Signed)
From a ladder at her home striking her right lower ribs and flank. sHe's had persistent pain there although she's noticed no bruising. She says that she has pain when she takes a deep breath. She's noted no blood in her urine, no abdominal pain.  Patient has an associated left shoulders. She has no neck pain however.  Objective: [name] in no acute distress, articulate and cooperative HEENT: Unremarkable Chest: Clear Heart: Regular no murmur Abdomen: Soft nontender Palpation of right lower ribs reveals pain in the back but not in the front  [facility] reading (PRIMARY) by  Dr. Joseph Graham.  Negative for fx Results for orders placed in visit on 03/13/14  POCT URINALYSIS DIPSTICK      Result Value Ref Range   Color, UA yellow     Clarity, UA clear     Glucose, UA neg     Bilirubin, UA neg     Ketones, UA neg     Spec Grav, UA 1.010     Blood, UA neg     pH, UA 7.0     Protein, UA neg     Urobilinogen, UA 0.2     Nitrite, UA neg     Leukocytes, UA moderate (2+)     Assessment: Bruised ribs, pyuria.  Plan: Voltaren and Cipro Please return if pain not improving in 48 hours  Signed, Theresa Graham.D.

## 2014-03-13 NOTE — Patient Instructions (Signed)
The urine test shows moderate infection, which may be causing the right flank pain to some degree.

## 2014-03-14 LAB — URINE CULTURE
Colony Count: NO GROWTH
Organism ID, Bacteria: NO GROWTH

## 2014-03-25 ENCOUNTER — Other Ambulatory Visit: Payer: Self-pay | Admitting: Family Medicine

## 2014-03-31 ENCOUNTER — Telehealth: Payer: Self-pay | Admitting: *Deleted

## 2014-03-31 NOTE — Telephone Encounter (Signed)
PT CALLED REQUESTING TO HAVE BONE DENSITY ORDER FAXED TO [facility]. THIS WAS DONE

## 2014-05-16 ENCOUNTER — Encounter: Payer: Self-pay | Admitting: Gynecology

## 2014-05-17 ENCOUNTER — Other Ambulatory Visit (HOSPITAL_COMMUNITY)
Admission: RE | Admit: 2014-05-17 | Discharge: 2014-05-17 | Disposition: A | Discharge status: Home / Self Care | Payer: Managed Care, Other (non HMO) | Source: Ambulatory Visit | Attending: Gynecology | Admitting: Gynecology

## 2014-05-17 ENCOUNTER — Ambulatory Visit (INDEPENDENT_AMBULATORY_CARE_PROVIDER_SITE_OTHER): Payer: Managed Care, Other (non HMO) | Admitting: Gynecology

## 2014-05-17 ENCOUNTER — Encounter: Payer: Self-pay | Admitting: Gynecology

## 2014-05-17 VITALS — BP 120/72 | Ht 64.5 in | Wt 140.0 lb

## 2014-05-17 DIAGNOSIS — N952 Postmenopausal atrophic vaginitis: Secondary | ICD-10-CM

## 2014-05-17 DIAGNOSIS — Z01419 Encounter for gynecological examination (general) (routine) without abnormal findings: Secondary | ICD-10-CM | POA: Diagnosis present

## 2014-05-17 DIAGNOSIS — N7011 Chronic salpingitis: Secondary | ICD-10-CM

## 2014-05-17 DIAGNOSIS — N898 Other specified noninflammatory disorders of vagina: Secondary | ICD-10-CM

## 2014-05-17 DIAGNOSIS — Z23 Encounter for immunization: Secondary | ICD-10-CM

## 2014-05-17 DIAGNOSIS — M8580 Other specified disorders of bone density and structure, unspecified site: Secondary | ICD-10-CM

## 2014-05-17 LAB — WET PREP FOR TRICH, YEAST, CLUE
CLUE CELLS WET PREP: NONE SEEN
Trich, Wet Prep: NONE SEEN

## 2014-05-17 MED ORDER — VALACYCLOVIR HCL 500 MG PO TABS: 500.0000 mg | ORAL_TABLET | Freq: Every day | ORAL | Status: DC

## 2014-05-17 MED ORDER — FLUCONAZOLE 150 MG PO TABS: 150.0000 mg | ORAL_TABLET | Freq: Once | ORAL | Status: DC

## 2014-05-17 NOTE — Addendum Note (Signed)
Addended by: Nelva Nay on: 05/17/2014 03:11 PM   Modules accepted: Orders

## 2014-05-17 NOTE — Addendum Note (Signed)
Addended by: Nelva Nay on: 05/17/2014 03:10 PM   Modules accepted: Orders

## 2014-05-17 NOTE — Progress Notes (Addendum)
Theresa Graham 1951/07/09 [id number]        63 y.o.  G1P1001 for annual exam.  Several issues noted below  Past medical history,surgical history, problem list, medications, allergies, family history and social history were all reviewed and documented as reviewed in the EPIC chart.  ROS:  12 system ROS performed with pertinent positives and negatives included in the history, assessment and plan.   Additional significant findings :  none   Exam: [name] Vitals:   05/17/14 1400  BP: 120/72  Height: 5' 4.5" (1.638 m)  Weight: 140 lb (63.504 kg)   General appearance:  Normal affect, orientation and appearance. Skin: Grossly normal HEENT: Without gross lesions.  No cervical or supraclavicular adenopathy. Thyroid normal.  Lungs:  Clear without wheezing, rales or rhonchi Cardiac: RR, without RMG Abdominal:  Soft, nontender, without masses, guarding, rebound, organomegaly or hernia Breasts:  Examined lying and sitting without masses, retractions, discharge or axillary adenopathy.   Pelvic:  Ext/BUS/vagina with atrophic changes. Pap of cuff done  Adnexa  Without masses or tenderness    Anus and perineum  Normal   Rectovaginal  Normal sphincter tone without palpated masses or tenderness.    Assessment/Plan:  63 y.o. G57P1001 female for annual exam.   1. Postmenopausal/atrophic genital changes. Status post TAH for bleeding/leiomyoma.  Doing well without significant hot flushes, night sweats. Was using Estrace cream but admits to only using it occasionally. Suggested that she stop but then will see how she does. If she does have significant vaginal dryness she'll call and we will reinitiate the Estrace vaginal cream. Of counseled her as to the risks to include absorption and systemic estrogen effects. 2. Vaginal discharge.  For several weeks on and off. Wet prep is positive for yeast. Will treat with Diflucan 150 mg 1 dose area follow up if symptoms persist, worsen or  recur. 3. Osteopenia.  DEXA 04/2012 T score -1.6. FRAX 3.1%/0.1%. Repeat DEXA now a 2 year interval. Increase calcium vitamin D reviewed. 4. Hydrosalpinx.  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 Stable in size since 2008. Did not ultrasound her last year. Patient asked about having an ultrasound this year for reassurance and we went ahead and scheduled this. 5. HSV.  Stopped daily suppressive Valtrex. Had 2 outbreaks which she treated with twice a day Valtrex times several days. Wants to continue with this and I refilled her #60 with 1 refill to her mail order pharmacy. 6. Pap smear 2012.  Pap smear of the vaginal cuff done today. Status post hysterectomy. No history of significant abnormal Pap smears. Options to stop screening altogether or less frequent screening intervals reviewed. Patient preferred to be screened and Pap smear was done today. 7. Mammography 04/2014.  Continue with annual mammography. SBE monthly reviewed. 8. Colonoscopy 2007.  Patient states recommended repeat interval 10 years. 9. Health maintenance. No routine blood work done and she reports this done at her primary physician's office. Follow up for ultrasound/bone density otherwise annually.     Anastasio Auerbach MD, 2:26 PM 05/17/2014      With medical

## 2014-05-17 NOTE — Patient Instructions (Signed)
Follow up for bone density and ultrasound as scheduled.  You may obtain a copy of any labs that were done today by logging onto MyChart as outlined in the instructions provided with your AVS (after visit summary). The office will not call with normal lab results but certainly if there are any significant abnormalities then we will contact you.   Health Maintenance, Female A healthy lifestyle and preventative care can promote health and wellness.  Maintain regular health, dental, and eye exams.  Eat a healthy diet. Foods like vegetables, fruits, whole grains, low-fat dairy products, and lean protein foods contain the nutrients you need without too many calories. Decrease your intake of foods high in solid fats, added sugars, and salt. Get information about a proper diet from your caregiver, if necessary.  Regular physical exercise is one of the most important things you can do for your health. Most adults should get at least 150 minutes of moderate-intensity exercise (any activity that increases your heart rate and causes you to sweat) each week. In addition, most adults need muscle-strengthening exercises on 2 or more days a week.   Maintain a healthy weight. The body mass index (BMI) is a screening tool to identify possible weight problems. It provides an estimate of body fat based on height and weight. Your caregiver can help determine your BMI, and can help you achieve or maintain a healthy weight. For adults 20 years and older:  A BMI below 18.5 is considered underweight.  A BMI of 18.5 to 24.9 is normal.  A BMI of 25 to 29.9 is considered overweight.  A BMI of 30 and above is considered obese.  Maintain normal blood lipids and cholesterol by exercising and minimizing your intake of saturated fat. Eat a balanced diet with plenty of fruits and vegetables. Blood tests for lipids and cholesterol should begin at age 56 and be repeated every 5 years. If your lipid or cholesterol levels are  high, you are over 50, or you are a high risk for heart disease, you may need your cholesterol levels checked more frequently.Ongoing high lipid and cholesterol levels should be treated with medicines if diet and exercise are not effective.  If you smoke, find out from your caregiver how to quit. If you do not use tobacco, do not start.  Lung cancer screening is recommended for adults aged 66 80 years who are at high risk for developing lung cancer because of a history of smoking. Yearly low-dose computed tomography (CT) is recommended for people who have at least a 30-pack-year history of smoking and are a current smoker or have quit within the past 15 years. A pack year of smoking is smoking an average of 1 pack of cigarettes a day for 1 year (for example: 1 pack a day for 30 years or 2 packs a day for 15 years). Yearly screening should continue until the smoker has stopped smoking for at least 15 years. Yearly screening should also be stopped for people who develop a health problem that would prevent them from having lung cancer treatment.  If you are pregnant, do not drink alcohol. If you are breastfeeding, be very cautious about drinking alcohol. If you are not pregnant and choose to drink alcohol, do not exceed 1 drink per day. One drink is considered to be 12 ounces (355 mL) of beer, 5 ounces (148 mL) of wine, or 1.5 ounces (44 mL) of liquor.  Avoid use of street drugs. Do not share needles with anyone. Ask  for help if you need support or instructions about stopping the use of drugs.  High blood pressure causes heart disease and increases the risk of stroke. Blood pressure should be checked at least every 1 to 2 years. Ongoing high blood pressure should be treated with medicines, if weight loss and exercise are not effective.  If you are 59 to 63 years old, ask your caregiver if you should take aspirin to prevent strokes.  Diabetes screening involves taking a blood sample to check your fasting  blood sugar level. This should be done once every 3 years, after age 71, if you are within normal weight and without risk factors for diabetes. Testing should be considered at a younger age or be carried out more frequently if you are overweight and have at least 1 risk factor for diabetes.  Breast cancer screening is essential preventative care for women. You should practice "breast self-awareness." This means understanding the normal appearance and feel of your breasts and may include breast self-examination. Any changes detected, no matter how small, should be reported to a caregiver. Women in their 28s and 30s should have a clinical breast exam (CBE) by a caregiver as part of a regular health exam every 1 to 3 years. After age 32, women should have a CBE every year. Starting at age 36, women should consider having a mammogram (breast X-ray) every year. Women who have a family history of breast cancer should talk to their caregiver about genetic screening. Women at a high risk of breast cancer should talk to their caregiver about having an MRI and a mammogram every year.  Breast cancer gene (BRCA)-related cancer risk assessment is recommended for women who have family members with BRCA-related cancers. BRCA-related cancers include breast, ovarian, tubal, and peritoneal cancers. Having family members with these cancers may be associated with an increased risk for harmful changes (mutations) in the breast cancer genes BRCA1 and BRCA2. Results of the assessment will determine the need for genetic counseling and BRCA1 and BRCA2 testing.  The Pap test is a screening test for cervical cancer. Women should have a Pap test starting at age 14. Between ages 14 and 72, Pap tests should be repeated every 2 years. Beginning at age 30, you should have a Pap test every 3 years as long as the past 3 Pap tests have been normal. If you had a hysterectomy for a problem that was not cancer or a condition that could lead to  cancer, then you no longer need Pap tests. If you are between ages 62 and 35, and you have had normal Pap tests going back 10 years, you no longer need Pap tests. If you have had past treatment for cervical cancer or a condition that could lead to cancer, you need Pap tests and screening for cancer for at least 20 years after your treatment. If Pap tests have been discontinued, risk factors (such as a new sexual partner) need to be reassessed to determine if screening should be resumed. Some women have medical problems that increase the chance of getting cervical cancer. In these cases, your caregiver may recommend more frequent screening and Pap tests.  The human papillomavirus (HPV) test is an additional test that may be used for cervical cancer screening. The HPV test looks for the virus that can cause the cell changes on the cervix. The cells collected during the Pap test can be tested for HPV. The HPV test could be used to screen women aged 46 years and  older, and should be used in women of any age who have unclear Pap test results. After the age of 97, women should have HPV testing at the same frequency as a Pap test.  Colorectal cancer can be detected and often prevented. Most routine colorectal cancer screening begins at the age of 35 and continues through age 25. However, your caregiver may recommend screening at an earlier age if you have risk factors for colon cancer. On a yearly basis, your caregiver may provide home test kits to check for hidden blood in the stool. Use of a small camera at the end of a tube, to directly examine the colon (sigmoidoscopy or colonoscopy), can detect the earliest forms of colorectal cancer. Talk to your caregiver about this at age 55, when routine screening begins. Direct examination of the colon should be repeated every 5 to 10 years through age 78, unless early forms of pre-cancerous polyps or small growths are found.  Hepatitis C blood testing is recommended for  all people born from [birth year] through 1965 and any individual with known risks for hepatitis C.  Practice safe sex. Use condoms and avoid high-risk sexual practices to reduce the spread of sexually transmitted infections (STIs). Sexually active women aged 29 and younger should be checked for Chlamydia, which is a common sexually transmitted infection. Older women with new or multiple partners should also be tested for Chlamydia. Testing for other STIs is recommended if you are sexually active and at increased risk.  Osteoporosis is a disease in which the bones lose minerals and strength with aging. This can result in serious bone fractures. The risk of osteoporosis can be identified using a bone density scan. Women ages 34 and over and women at risk for fractures or osteoporosis should discuss screening with their caregivers. Ask your caregiver whether you should be taking a calcium supplement or vitamin D to reduce the rate of osteoporosis.  Menopause can be associated with physical symptoms and risks. Hormone replacement therapy is available to decrease symptoms and risks. You should talk to your caregiver about whether hormone replacement therapy is right for you.  Use sunscreen. Apply sunscreen liberally and repeatedly throughout the day. You should seek shade when your shadow is shorter than you. Protect yourself by wearing long sleeves, pants, a wide-brimmed hat, and sunglasses year round, whenever you are outdoors.  Notify your caregiver of new moles or changes in moles, especially if there is a change in shape or color. Also notify your caregiver if a mole is larger than the size of a pencil eraser.  Stay current with your immunizations. Document Released: 01/20/2011 Document Revised: 11/01/2012 Document Reviewed: 01/20/2011 [facility] Patient Information 2014 [place].

## 2014-05-18 ENCOUNTER — Telehealth: Payer: Self-pay | Admitting: *Deleted

## 2014-05-18 LAB — URINALYSIS W MICROSCOPIC + REFLEX CULTURE
BACTERIA UA: NONE SEEN
Bilirubin Urine: NEGATIVE
Casts: NONE SEEN
Crystals: NONE SEEN
Glucose, UA: NEGATIVE mg/dL
Hgb urine dipstick: NEGATIVE
Ketones, ur: NEGATIVE mg/dL
Leukocytes, UA: NEGATIVE
Nitrite: NEGATIVE
Protein, ur: NEGATIVE mg/dL
Specific Gravity, Urine: 1.017 (ref 1.005–1.030)
Squamous Epithelial / LPF: NONE SEEN
UROBILINOGEN UA: 0.2 mg/dL (ref 0.0–1.0)
pH: 7.5 (ref 5.0–8.0)

## 2014-05-18 NOTE — Telephone Encounter (Signed)
Pt was given diflucan tablet for yeast infection asked if okay to have sex. I left message on voicemail best to wait 2 day after taking pill.

## 2014-05-19 LAB — CYTOLOGY - PAP

## 2014-05-22 ENCOUNTER — Telehealth: Payer: Self-pay | Admitting: *Deleted

## 2014-05-22 ENCOUNTER — Other Ambulatory Visit: Payer: Self-pay | Admitting: Gynecology

## 2014-05-22 ENCOUNTER — Encounter: Payer: Self-pay | Admitting: Gynecology

## 2014-05-22 MED ORDER — TERCONAZOLE 0.8 % VA CREA: 1.0000 | TOPICAL_CREAM | Freq: Every day | VAGINAL | Status: DC

## 2014-05-22 NOTE — Telephone Encounter (Signed)
If she failed the Diflucan I recommend Terazol 3 day cream. Applicator at bedtime 3.

## 2014-05-22 NOTE — Telephone Encounter (Signed)
Pt called to see if we had received her BD results. We had not therefore I called [facility] and requested it be sent to [place]. I spoke with Andee Poles at [place]

## 2014-05-22 NOTE — Telephone Encounter (Signed)
Pt was treated for yeast infection on OV 05/18/14 with Diflucan 150 x 1 . Pt said yeast infection still there asked if refill could be sent to pharmacy? Please advise

## 2014-05-22 NOTE — Telephone Encounter (Signed)
Pt informed with below note, rx sent. 

## 2014-05-23 ENCOUNTER — Encounter: Payer: Self-pay | Admitting: Gynecology

## 2014-05-23 ENCOUNTER — Other Ambulatory Visit: Payer: Self-pay | Admitting: *Deleted

## 2014-05-23 ENCOUNTER — Telehealth: Payer: Self-pay | Admitting: Gynecology

## 2014-05-23 DIAGNOSIS — M8580 Other specified disorders of bone density and structure, unspecified site: Secondary | ICD-10-CM

## 2014-05-23 NOTE — Telephone Encounter (Signed)
Pt informed with the below note, pt will have Vitamin D level checked at PCP in office and have copy faxed here.

## 2014-05-23 NOTE — Telephone Encounter (Signed)
Left message for pt to call.

## 2014-05-23 NOTE — Telephone Encounter (Signed)
Tell patient she does have some loss of bone density. Not to the point of treatment but I would recommend checking a vitamin D level now to make sure that she is in the normal range and maximizing calcium at 1200 mg total dietary daily. We will repeat the bone density in 2 years. Increasing weightbearing exercise such as walking on a regular basis also good for the bones.

## 2014-05-30 ENCOUNTER — Encounter: Payer: Self-pay | Admitting: Gynecology

## 2014-05-31 ENCOUNTER — Ambulatory Visit (INDEPENDENT_AMBULATORY_CARE_PROVIDER_SITE_OTHER): Payer: Managed Care, Other (non HMO)

## 2014-05-31 ENCOUNTER — Ambulatory Visit (INDEPENDENT_AMBULATORY_CARE_PROVIDER_SITE_OTHER): Payer: Managed Care, Other (non HMO) | Admitting: Gynecology

## 2014-05-31 ENCOUNTER — Encounter: Payer: Self-pay | Admitting: Gynecology

## 2014-05-31 DIAGNOSIS — N7011 Chronic salpingitis: Secondary | ICD-10-CM

## 2014-05-31 NOTE — Progress Notes (Signed)
Theresa Graham 1951/05/12 [id number]        63 y.o.  G1P1001 presents for ultrasound. 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 Last ultrasound 2 years ago it measured 1.2 cm, avascular simple an echo free. Patient presents today for follow up ultrasound. She is without complaints of pain or other symptoms.  Past medical history,surgical history, problem list, medications, allergies, family history and social history were all reviewed and documented in the EPIC chart.  Directed ROS with pertinent positives and negatives documented in the history of present illness/assessment and plan.  Ultrasound shows right ovary atrophic. Continued presence of tubular, avascular cystic area 11 x 11 x 6 mm. Left ovary atrophic. Cul-de-sac negative.  Assessment/Plan:  63 y.o. 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 Reviewed with patient. We both agree with following expectantly and probably no further ultrasounds.     Anastasio Auerbach MD, 9:55 AM 05/31/2014

## 2014-05-31 NOTE — Patient Instructions (Signed)
Follow up when you're due for your annual exam. 

## 2015-04-23 ENCOUNTER — Encounter: Payer: Self-pay | Admitting: Gynecology

## 2015-05-28 ENCOUNTER — Encounter: Payer: Self-pay | Admitting: Gynecology

## 2015-07-02 ENCOUNTER — Encounter: Payer: Managed Care, Other (non HMO) | Admitting: Gynecology

## 2015-07-04 ENCOUNTER — Ambulatory Visit (INDEPENDENT_AMBULATORY_CARE_PROVIDER_SITE_OTHER): Payer: Managed Care, Other (non HMO) | Admitting: Gynecology

## 2015-07-04 ENCOUNTER — Encounter: Payer: Self-pay | Admitting: Gynecology

## 2015-07-04 ENCOUNTER — Other Ambulatory Visit (HOSPITAL_COMMUNITY)
Admission: RE | Admit: 2015-07-04 | Discharge: 2015-07-04 | Disposition: A | Discharge status: Home / Self Care | Payer: Managed Care, Other (non HMO) | Source: Ambulatory Visit | Attending: Gynecology | Admitting: Gynecology

## 2015-07-04 VITALS — BP 120/74 | Ht 65.0 in | Wt 140.0 lb

## 2015-07-04 DIAGNOSIS — N7011 Chronic salpingitis: Secondary | ICD-10-CM

## 2015-07-04 DIAGNOSIS — Z01419 Encounter for gynecological examination (general) (routine) without abnormal findings: Secondary | ICD-10-CM

## 2015-07-04 DIAGNOSIS — N952 Postmenopausal atrophic vaginitis: Secondary | ICD-10-CM | POA: Diagnosis not present

## 2015-07-04 DIAGNOSIS — M8580 Other specified disorders of bone density and structure, unspecified site: Secondary | ICD-10-CM | POA: Diagnosis not present

## 2015-07-04 DIAGNOSIS — Z124 Encounter for screening for malignant neoplasm of cervix: Secondary | ICD-10-CM | POA: Insufficient documentation

## 2015-07-04 DIAGNOSIS — A609 Anogenital herpesviral infection, unspecified: Secondary | ICD-10-CM | POA: Diagnosis not present

## 2015-07-04 MED ORDER — VALACYCLOVIR HCL 500 MG PO TABS: 500.0000 mg | ORAL_TABLET | Freq: Every day | ORAL | Status: DC

## 2015-07-04 NOTE — Patient Instructions (Signed)
Follow up for ultrasound as scheduled.  Start the Valtrex daily for suppression  You may obtain a copy of any labs that were done today by logging onto MyChart as outlined in the instructions provided with your AVS (after visit summary). The office will not call with normal lab results but certainly if there are any significant abnormalities then we will contact you.   Health Maintenance Adopting a healthy lifestyle and getting preventive care can go a long way to promote health and wellness. Talk with your health care provider about what schedule of regular examinations is right for you. This is a good chance for you to check in with your provider about disease prevention and staying healthy. In between checkups, there are plenty of things you can do on your own. Experts have done a lot of research about which lifestyle changes and preventive measures are most likely to keep you healthy. Ask your health care provider for more information. WEIGHT AND DIET  Eat a healthy diet  Be sure to include plenty of vegetables, fruits, low-fat dairy products, and lean protein.  Do not eat a lot of foods high in solid fats, added sugars, or salt.  Get regular exercise. This is one of the most important things you can do for your health.  Most adults should exercise for at least 150 minutes each week. The exercise should increase your heart rate and make you sweat (moderate-intensity exercise).  Most adults should also do strengthening exercises at least twice a week. This is in addition to the moderate-intensity exercise.  Maintain a healthy weight  Body mass index (BMI) is a measurement that can be used to identify possible weight problems. It estimates body fat based on height and weight. Your health care provider can help determine your BMI and help you achieve or maintain a healthy weight.  For females 83 years of age and older:   A BMI below 18.5 is considered underweight.  A BMI of 18.5 to  24.9 is normal.  A BMI of 25 to 29.9 is considered overweight.  A BMI of 30 and above is considered obese.  Watch levels of cholesterol and blood lipids  You should start having your blood tested for lipids and cholesterol at 64 years of age, then have this test every 5 years.  You may need to have your cholesterol levels checked more often if:  Your lipid or cholesterol levels are high.  You are older than 64 years of age.  You are at high risk for heart disease.  CANCER SCREENING   Lung Cancer  Lung cancer screening is recommended for adults 45-51 years old who are at high risk for lung cancer because of a history of smoking.  A yearly low-dose CT scan of the lungs is recommended for people who:  Currently smoke.  Have quit within the past 15 years.  Have at least a 30-pack-year history of smoking. A pack year is smoking an average of one pack of cigarettes a day for 1 year.  Yearly screening should continue until it has been 15 years since you quit.  Yearly screening should stop if you develop a health problem that would prevent you from having lung cancer treatment.  Breast Cancer  Practice breast self-awareness. This means understanding how your breasts normally appear and feel.  It also means doing regular breast self-exams. Let your health care provider know about any changes, no matter how small.  If you are in your 20s or 30s, you  should have a clinical breast exam (CBE) by a health care provider every 1-3 years as part of a regular health exam.  If you are 84 or older, have a CBE every year. Also consider having a breast X-ray (mammogram) every year.  If you have a family history of breast cancer, talk to your health care provider about genetic screening.  If you are at high risk for breast cancer, talk to your health care provider about having an MRI and a mammogram every year.  Breast cancer gene (BRCA) assessment is recommended for women who have family  members with BRCA-related cancers. BRCA-related cancers include:  Breast.  Ovarian.  Tubal.  Peritoneal cancers.  Results of the assessment will determine the need for genetic counseling and BRCA1 and BRCA2 testing. Cervical Cancer Routine pelvic examinations to screen for cervical cancer are no longer recommended for nonpregnant women who are considered low risk for cancer of the pelvic organs (ovaries, uterus, and vagina) and who do not have symptoms. A pelvic examination may be necessary if you have symptoms including those associated with pelvic infections. Ask your health care provider if a screening pelvic exam is right for you.   The Pap test is the screening test for cervical cancer for women who are considered at risk.  If you had a hysterectomy for a problem that was not cancer or a condition that could lead to cancer, then you no longer need Pap tests.  If you are older than 65 years, and you have had normal Pap tests for the past 10 years, you no longer need to have Pap tests.  If you have had past treatment for cervical cancer or a condition that could lead to cancer, you need Pap tests and screening for cancer for at least 20 years after your treatment.  If you no longer get a Pap test, assess your risk factors if they change (such as having a new sexual partner). This can affect whether you should start being screened again.  Some women have medical problems that increase their chance of getting cervical cancer. If this is the case for you, your health care provider may recommend more frequent screening and Pap tests.  The human papillomavirus (HPV) test is another test that may be used for cervical cancer screening. The HPV test looks for the virus that can cause cell changes in the cervix. The cells collected during the Pap test can be tested for HPV.  The HPV test can be used to screen women 59 years of age and older. Getting tested for HPV can extend the interval  between normal Pap tests from three to five years.  An HPV test also should be used to screen women of any age who have unclear Pap test results.  After 64 years of age, women should have HPV testing as often as Pap tests.  Colorectal Cancer  This type of cancer can be detected and often prevented.  Routine colorectal cancer screening usually begins at 64 years of age and continues through 64 years of age.  Your health care provider may recommend screening at an earlier age if you have risk factors for colon cancer.  Your health care provider may also recommend using home test kits to check for hidden blood in the stool.  A small camera at the end of a tube can be used to examine your colon directly (sigmoidoscopy or colonoscopy). This is done to check for the earliest forms of colorectal cancer.  Routine screening  usually begins at age 70.  Direct examination of the colon should be repeated every 5-10 years through 64 years of age. However, you may need to be screened more often if early forms of precancerous polyps or small growths are found. Skin Cancer  Check your skin from head to toe regularly.  Tell your health care provider about any new moles or changes in moles, especially if there is a change in a mole's shape or color.  Also tell your health care provider if you have a mole that is larger than the size of a pencil eraser.  Always use sunscreen. Apply sunscreen liberally and repeatedly throughout the day.  Protect yourself by wearing long sleeves, pants, a wide-brimmed hat, and sunglasses whenever you are outside. HEART DISEASE, DIABETES, AND HIGH BLOOD PRESSURE   Have your blood pressure checked at least every 1-2 years. High blood pressure causes heart disease and increases the risk of stroke.  If you are between 43 years and 54 years old, ask your health care provider if you should take aspirin to prevent strokes.  Have regular diabetes screenings. This involves  taking a blood sample to check your fasting blood sugar level.  If you are at a normal weight and have a low risk for diabetes, have this test once every three years after 63 years of age.  If you are overweight and have a high risk for diabetes, consider being tested at a younger age or more often. PREVENTING INFECTION  Hepatitis B  If you have a higher risk for hepatitis B, you should be screened for this virus. You are considered at high risk for hepatitis B if:  You were born in a country where hepatitis B is common. Ask your health care provider which countries are considered high risk.  Your parents were born in a high-risk country, and you have not been immunized against hepatitis B (hepatitis B vaccine).  You have HIV or AIDS.  You use needles to inject street drugs.  You live with someone who has hepatitis B.  You have had sex with someone who has hepatitis B.  You get hemodialysis treatment.  You take certain medicines for conditions, including cancer, organ transplantation, and autoimmune conditions. Hepatitis C  Blood testing is recommended for:  Everyone born from [birth year] through 1965.  Anyone with known risk factors for hepatitis C. Sexually transmitted infections (STIs)  You should be screened for sexually transmitted infections (STIs) including gonorrhea and chlamydia if:  You are sexually active and are younger than 64 years of age.  You are older than 64 years of age and your health care provider tells you that you are at risk for this type of infection.  Your sexual activity has changed since you were last screened and you are at an increased risk for chlamydia or gonorrhea. Ask your health care provider if you are at risk.  If you do not have HIV, but are at risk, it may be recommended that you take a prescription medicine daily to prevent HIV infection. This is called pre-exposure prophylaxis (PrEP). You are considered at risk if:  You are sexually active  and do not regularly use condoms or know the HIV status of your partner(s).  You take drugs by injection.  You are sexually active with a partner who has HIV. Talk with your health care provider about whether you are at high risk of being infected with HIV. If you choose to begin PrEP, you should first be tested  for HIV. You should then be tested every 3 months for as long as you are taking PrEP.  PREGNANCY   If you are premenopausal and you may become pregnant, ask your health care provider about preconception counseling.  If you may become pregnant, take 400 to 800 micrograms (mcg) of folic acid every day.  If you want to prevent pregnancy, talk to your health care provider about birth control (contraception). OSTEOPOROSIS AND MENOPAUSE   Osteoporosis is a disease in which the bones lose minerals and strength with aging. This can result in serious bone fractures. Your risk for osteoporosis can be identified using a bone density scan.  If you are 23 years of age or older, or if you are at risk for osteoporosis and fractures, ask your health care provider if you should be screened.  Ask your health care provider whether you should take a calcium or vitamin D supplement to lower your risk for osteoporosis.  Menopause may have certain physical symptoms and risks.  Hormone replacement therapy may reduce some of these symptoms and risks. Talk to your health care provider about whether hormone replacement therapy is right for you.  HOME CARE INSTRUCTIONS   Schedule regular health, dental, and eye exams.  Stay current with your immunizations.   Do not use any tobacco products including cigarettes, chewing tobacco, or electronic cigarettes.  If you are pregnant, do not drink alcohol.  If you are breastfeeding, limit how much and how often you drink alcohol.  Limit alcohol intake to no more than 1 drink per day for nonpregnant women. One drink equals 12 ounces of beer, 5 ounces of wine,  or 1 ounces of hard liquor.  Do not use street drugs.  Do not share needles.  Ask your health care provider for help if you need support or information about quitting drugs.  Tell your health care provider if you often feel depressed.  Tell your health care provider if you have ever been abused or do not feel safe at home. Document Released: 01/20/2011 Document Revised: 11/21/2013 Document Reviewed: 06/08/2013 [facility] Patient Information 2015 [place], [place]. This information is not intended to replace advice given to you by your health care provider. Make sure you discuss any questions you have with your health care provider.

## 2015-07-04 NOTE — Addendum Note (Signed)
Addended by: Nelva Nay on: 07/04/2015 02:39 PM   Modules accepted: Orders

## 2015-07-04 NOTE — Progress Notes (Signed)
Theresa Graham [birth year] [id number]        64 y.o.  G1P1001  for annual exam.  Several issues noted below  Past medical history,surgical history, problem list, medications, allergies, family history and social history were all reviewed and documented as reviewed in the EPIC chart.  ROS:  Performed with pertinent positives and negatives included in the history, assessment and plan.   Additional significant findings :  none   Exam: [name] Vitals:   07/04/15 1406  BP: 120/74  Height: 5\' 5"  (1.651 m)  Weight: 140 lb (63.504 kg)   General appearance:  Normal affect, orientation and appearance. Skin: Grossly normal HEENT: Without gross lesions.  No cervical or supraclavicular adenopathy. Thyroid normal.  Lungs:  Clear without wheezing, rales or rhonchi Cardiac: RR, without RMG Abdominal:  Soft, nontender, without masses, guarding, rebound, organomegaly or hernia Breasts:  Examined lying and sitting without masses, retractions, discharge or axillary adenopathy. Pelvic:  Ext/BUS/vagina with atrophic changes.  Pap smear of cuff done  Adnexa  Without masses or tenderness    Anus and perineum  Normal excepting classic HSV outbreak right labial fold with perineum with cluster of small vesicles  Rectovaginal  Normal sphincter tone without palpated masses or tenderness.    Assessment/Plan:  64 y.o. G3P1001 female for annual exam.   1. Postmenopausal/atrophic genital changes. 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 Doing well without HRT. No significant hot flashes, night sweats, vaginal dryness. Was using Estrace cream previously but stopped it and is not having any issues. 2. Abdominal bloating. Over the last several months patient notes some lower abdominal bloating. 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 We'll go ahead and repeat ultrasound now given the bloating history  just make sure nothing new has developed. 3. HSV. Patient has an active HSV operate now. She is on plaquenil and I suggested that she reinitiate the daily Valtrex 500 mg suppression. She uses the past with success. #90 with 4 refills provided. 4. Osteopenia.  DEXA T score -2.0 FRAX 3.4%/0.2%. Plan repeat DEXA next year. Increased calcium vitamin D. 5. Mammography 05/2015. Continue with annual mammography. SBE monthly reviewed. 6. Colonoscopy 2016. Repeat at their recommended interval. 7. Pap smear 2015. Pap smear done today. Will continue with annual Pap smears due to her immunosuppression. 8. Health maintenance. No routine lab work done as she has this done elsewhere. Follow up for ultrasound as scheduled otherwise annual exam in one year.   Anastasio Auerbach MD, 2:24 PM 07/04/2015

## 2015-07-06 LAB — CYTOLOGY - PAP

## 2015-07-22 DIAGNOSIS — M810 Age-related osteoporosis without current pathological fracture: Secondary | ICD-10-CM

## 2015-07-22 HISTORY — DX: Age-related osteoporosis without current pathological fracture: M81.0

## 2015-08-01 ENCOUNTER — Ambulatory Visit (INDEPENDENT_AMBULATORY_CARE_PROVIDER_SITE_OTHER): Payer: Managed Care, Other (non HMO) | Admitting: Gynecology

## 2015-08-01 ENCOUNTER — Ambulatory Visit (INDEPENDENT_AMBULATORY_CARE_PROVIDER_SITE_OTHER): Payer: Managed Care, Other (non HMO)

## 2015-08-01 ENCOUNTER — Other Ambulatory Visit: Payer: Managed Care, Other (non HMO)

## 2015-08-01 ENCOUNTER — Encounter: Payer: Self-pay | Admitting: Gynecology

## 2015-08-01 ENCOUNTER — Ambulatory Visit: Payer: Managed Care, Other (non HMO) | Admitting: Gynecology

## 2015-08-01 VITALS — BP 120/76

## 2015-08-01 DIAGNOSIS — R140 Abdominal distension (gaseous): Secondary | ICD-10-CM

## 2015-08-01 DIAGNOSIS — N7011 Chronic salpingitis: Secondary | ICD-10-CM | POA: Diagnosis not present

## 2015-08-01 NOTE — Progress Notes (Signed)
Theresa Graham Oct 15, 1950 [id number]        65 y.o.  G1P1001 Presents for ultrasound. Was recently seen for her annual exam and was complaining of some abdominal bloating. She does have a small presumed hydrosalpinx on the right that we have been following with ultrasounds and I ordered the follow up ultrasound to make sure that this remains stable.  Measurement 2012 16 x 11 x 14 mm.  Past medical history,surgical history, problem list, medications, allergies, family history and social history were all reviewed and documented in the EPIC chart.  Directed ROS with pertinent positives and negatives documented in the history of present illness/assessment and plan.  Exam: Filed Vitals:   08/01/15 1037  BP: 120/76   General appearance:  Normal  Ultrasound status post hysterectomy.  Right and left ovaries atrophic.  Right adnexa with continued presence of tubular cystic mass 16 x 7 x 13 mm. Negative color flow. Cul-de-sac negative for fluid  Assessment/Plan:  65 y.o. G1P1001 with abdominal bloating. Right tubular cystic presumed small hydrosalpinx remains stable over years observation. Discussed with patient I do not think it's her source of bloating and that she is being followed for irritable bowel and chronic constipation. I think that this is the source of her discomfort and recommended she follow up with her GI physician and she agrees to do so.    Anastasio Auerbach MD, 10:48 AM 08/01/2015

## 2015-08-01 NOTE — Patient Instructions (Signed)
Follow up with your gastroenterologist in reference to the abdominal bloating.

## 2015-08-23 ENCOUNTER — Encounter: Payer: Self-pay | Admitting: Gynecology

## 2015-12-19 DIAGNOSIS — R7301 Impaired fasting glucose: Secondary | ICD-10-CM | POA: Diagnosis not present

## 2015-12-19 DIAGNOSIS — Z634 Disappearance and death of family member: Secondary | ICD-10-CM | POA: Diagnosis not present

## 2015-12-19 DIAGNOSIS — J069 Acute upper respiratory infection, unspecified: Secondary | ICD-10-CM | POA: Diagnosis not present

## 2015-12-19 DIAGNOSIS — I10 Essential (primary) hypertension: Secondary | ICD-10-CM | POA: Diagnosis not present

## 2015-12-28 DIAGNOSIS — E876 Hypokalemia: Secondary | ICD-10-CM | POA: Diagnosis not present

## 2016-02-18 DIAGNOSIS — R5381 Other malaise: Secondary | ICD-10-CM | POA: Diagnosis not present

## 2016-02-18 DIAGNOSIS — M2550 Pain in unspecified joint: Secondary | ICD-10-CM | POA: Diagnosis not present

## 2016-02-18 DIAGNOSIS — Z79899 Other long term (current) drug therapy: Secondary | ICD-10-CM | POA: Diagnosis not present

## 2016-02-18 DIAGNOSIS — R300 Dysuria: Secondary | ICD-10-CM | POA: Diagnosis not present

## 2016-02-18 DIAGNOSIS — E559 Vitamin D deficiency, unspecified: Secondary | ICD-10-CM | POA: Diagnosis not present

## 2016-02-21 DIAGNOSIS — M328 Other forms of systemic lupus erythematosus: Secondary | ICD-10-CM | POA: Diagnosis not present

## 2016-02-21 DIAGNOSIS — M8589 Other specified disorders of bone density and structure, multiple sites: Secondary | ICD-10-CM | POA: Diagnosis not present

## 2016-02-21 DIAGNOSIS — Z79899 Other long term (current) drug therapy: Secondary | ICD-10-CM | POA: Diagnosis not present

## 2016-03-03 DIAGNOSIS — D2271 Melanocytic nevi of right lower limb, including hip: Secondary | ICD-10-CM | POA: Diagnosis not present

## 2016-03-03 DIAGNOSIS — L720 Epidermal cyst: Secondary | ICD-10-CM | POA: Diagnosis not present

## 2016-03-03 DIAGNOSIS — D2272 Melanocytic nevi of left lower limb, including hip: Secondary | ICD-10-CM | POA: Diagnosis not present

## 2016-03-03 DIAGNOSIS — L821 Other seborrheic keratosis: Secondary | ICD-10-CM | POA: Diagnosis not present

## 2016-03-03 DIAGNOSIS — Z85828 Personal history of other malignant neoplasm of skin: Secondary | ICD-10-CM | POA: Diagnosis not present

## 2016-03-05 ENCOUNTER — Telehealth: Payer: Self-pay | Admitting: *Deleted

## 2016-03-05 MED ORDER — VALACYCLOVIR HCL 500 MG PO TABS: 500.0000 mg | ORAL_TABLET | Freq: Every day | ORAL | 2 refills | Status: DC

## 2016-03-05 NOTE — Telephone Encounter (Signed)
Pt has new mail order pharmacy, needs valtrex 500 mg sent to [facility]. Rx sent.

## 2016-03-26 ENCOUNTER — Telehealth: Payer: Self-pay | Admitting: *Deleted

## 2016-03-26 NOTE — Telephone Encounter (Signed)
Pt called stating valacyclovir 500 mg co-pay has increased to $90 for a 3 month supply,pt asked if a tier expection could be filled with [facility] this was done and denied because has not tried and failed alternative medication such as acyclovir tablets. I called and explained this to pt and she states she has plenty of medication and wishes to wait to speak with Dr.Fontaine about this at annual visit.

## 2016-04-17 DIAGNOSIS — Z23 Encounter for immunization: Secondary | ICD-10-CM | POA: Diagnosis not present

## 2016-05-23 ENCOUNTER — Encounter: Payer: Self-pay | Admitting: Gynecology

## 2016-05-23 DIAGNOSIS — M810 Age-related osteoporosis without current pathological fracture: Secondary | ICD-10-CM | POA: Diagnosis not present

## 2016-05-23 DIAGNOSIS — Z1231 Encounter for screening mammogram for malignant neoplasm of breast: Secondary | ICD-10-CM | POA: Diagnosis not present

## 2016-05-23 DIAGNOSIS — Z803 Family history of malignant neoplasm of breast: Secondary | ICD-10-CM | POA: Diagnosis not present

## 2016-05-27 ENCOUNTER — Encounter: Payer: Self-pay | Admitting: Gynecology

## 2016-05-29 ENCOUNTER — Encounter: Payer: Self-pay | Admitting: Gynecology

## 2016-06-03 DIAGNOSIS — M3210 Systemic lupus erythematosus, organ or system involvement unspecified: Secondary | ICD-10-CM | POA: Diagnosis not present

## 2016-06-16 ENCOUNTER — Ambulatory Visit
Admission: RE | Admit: 2016-06-16 | Discharge: 2016-06-16 | Disposition: A | Discharge status: Home / Self Care | Payer: Medicare Other | Source: Ambulatory Visit | Attending: Family Medicine | Admitting: Family Medicine

## 2016-06-16 ENCOUNTER — Other Ambulatory Visit: Payer: Self-pay | Admitting: Family Medicine

## 2016-06-16 DIAGNOSIS — M542 Cervicalgia: Secondary | ICD-10-CM

## 2016-06-16 DIAGNOSIS — R7301 Impaired fasting glucose: Secondary | ICD-10-CM | POA: Diagnosis not present

## 2016-06-16 DIAGNOSIS — M328 Other forms of systemic lupus erythematosus: Secondary | ICD-10-CM | POA: Diagnosis not present

## 2016-06-16 DIAGNOSIS — I10 Essential (primary) hypertension: Secondary | ICD-10-CM | POA: Diagnosis not present

## 2016-06-16 DIAGNOSIS — Z0000 Encounter for general adult medical examination without abnormal findings: Secondary | ICD-10-CM | POA: Diagnosis not present

## 2016-06-16 DIAGNOSIS — K589 Irritable bowel syndrome without diarrhea: Secondary | ICD-10-CM | POA: Diagnosis not present

## 2016-06-16 DIAGNOSIS — Z1159 Encounter for screening for other viral diseases: Secondary | ICD-10-CM | POA: Diagnosis not present

## 2016-06-16 DIAGNOSIS — M79605 Pain in left leg: Secondary | ICD-10-CM | POA: Diagnosis not present

## 2016-06-16 DIAGNOSIS — Z136 Encounter for screening for cardiovascular disorders: Secondary | ICD-10-CM | POA: Diagnosis not present

## 2016-06-16 DIAGNOSIS — Z23 Encounter for immunization: Secondary | ICD-10-CM | POA: Diagnosis not present

## 2016-06-16 DIAGNOSIS — Z1389 Encounter for screening for other disorder: Secondary | ICD-10-CM | POA: Diagnosis not present

## 2016-06-16 DIAGNOSIS — M329 Systemic lupus erythematosus, unspecified: Secondary | ICD-10-CM | POA: Diagnosis not present

## 2016-06-16 DIAGNOSIS — M8580 Other specified disorders of bone density and structure, unspecified site: Secondary | ICD-10-CM | POA: Diagnosis not present

## 2016-06-18 ENCOUNTER — Encounter: Payer: Self-pay | Admitting: Rheumatology

## 2016-06-18 DIAGNOSIS — Z79899 Other long term (current) drug therapy: Secondary | ICD-10-CM | POA: Insufficient documentation

## 2016-06-25 DIAGNOSIS — S46812A Strain of other muscles, fascia and tendons at shoulder and upper arm level, left arm, initial encounter: Secondary | ICD-10-CM | POA: Diagnosis not present

## 2016-06-25 DIAGNOSIS — M25562 Pain in left knee: Secondary | ICD-10-CM | POA: Diagnosis not present

## 2016-07-04 ENCOUNTER — Encounter: Payer: Managed Care, Other (non HMO) | Admitting: Gynecology

## 2016-07-07 ENCOUNTER — Encounter: Payer: Self-pay | Admitting: Gynecology

## 2016-07-07 ENCOUNTER — Ambulatory Visit (INDEPENDENT_AMBULATORY_CARE_PROVIDER_SITE_OTHER): Payer: Medicare Other | Admitting: Gynecology

## 2016-07-07 VITALS — BP 120/70 | Ht 64.0 in | Wt 153.0 lb

## 2016-07-07 DIAGNOSIS — Z01411 Encounter for gynecological examination (general) (routine) with abnormal findings: Secondary | ICD-10-CM | POA: Diagnosis not present

## 2016-07-07 DIAGNOSIS — A609 Anogenital herpesviral infection, unspecified: Secondary | ICD-10-CM

## 2016-07-07 DIAGNOSIS — N952 Postmenopausal atrophic vaginitis: Secondary | ICD-10-CM

## 2016-07-07 DIAGNOSIS — M810 Age-related osteoporosis without current pathological fracture: Secondary | ICD-10-CM

## 2016-07-07 MED ORDER — ACYCLOVIR 400 MG PO TABS: 400.0000 mg | ORAL_TABLET | Freq: Two times a day (BID) | ORAL | 2 refills | Status: DC

## 2016-07-07 NOTE — Patient Instructions (Signed)
office will call you to arrange for the Prolia.

## 2016-07-07 NOTE — Progress Notes (Signed)
    Theresa Graham 08-Nov-1950 [id number]        65 y.o.  G1P1001 for breast and pelvic exam  Past medical history,surgical history, problem list, medications, allergies, family history and social history were all reviewed and documented as reviewed in the EPIC chart.  ROS:  Performed with pertinent positives and negatives included in the history, assessment and plan.   Additional significant findings :  none   Exam: Caryn Bee assistant Vitals:   07/07/16 1109  BP: 120/70  Weight: 153 lb (69.4 kg)  Height: 5\' 4"  (1.626 m)   Body mass index is 26.26 kg/m.  General appearance:  Normal affect, orientation and appearance. Skin: Grossly normal HEENT: Without gross lesions.  No cervical or supraclavicular adenopathy. Thyroid normal.  Lungs:  Clear without wheezing, rales or rhonchi Cardiac: RR, without RMG Abdominal:  Soft, nontender, without masses, guarding, rebound, organomegaly or hernia Breasts:  Examined lying and sitting without masses, retractions, discharge or axillary adenopathy. Pelvic:  Ext, BUS, Vagina with atrophic changes  Adnexa without masses or tenderness    Anus and perineum normal   Rectovaginal normal sphincter tone without palpated masses or tenderness.    Assessment/Plan:  65 y.o. G77P1001 female for breast and pelvic exam  1. postmenopausal/atrophic genital changes. Status post [facility] for leiomyoma. Doing well without significant hot flushes, night sweats or vaginal dryness. 2. HSV. Had been using Valtrex but it is more expensive now asked about acyclovir. Is using it intermittently and not daily. Acyclovir 400 mg twice a day 5 days at earliest onset of symptoms. #30 with 3 refills. 3. Mammography 05/2016. Continue with annual mammography when due. SBE monthly reviewed. 4. Colonoscopy 2016. Repeat at their recommended interval. 5. Pap smear 2016. No Pap smear done today. Had been doing annual Pap smears due to her lupus treatment. She has no history of  abnormal Pap smears and status post hysterectomy. We'll plan less frequent screening interval but will continue to screen. 6. Osteoporosis. DEXA 05/2016 T score -3.3 at [facility]. Has shown loss at right and left hip as well as spine from prior DEXA. Saw her primary physician Dr. Tamala Julian who discussed possible initiation of Prolia. Had been on Fosamax for approximately 6 years by her history discontinued by  Dr. Cherylann Banas. Given that she has lost bone at all sites with T score - 3.3, discussed treatment options to include reinitiation of a bisphosphonate versus switching to Prolia. Risks to include GERD, osteonecrosis of the jaw, atypical fractures particularly with prolonged use, rash and infections all discussed. Patient wants to go ahead and initiate Prolia. Will precertified for this and she'll get called to initiate. She did note  told that her calcium levels have been elevated previously by Dr. Verita Lamb.  We'll check baseline vitamin D today, TSH and PTH. 7. Health maintenance. No routine lab work done as patient does this elsewhere. Follow up for initiation of Prolia. Follow up in one year for annual exam.  Additional time in excess of her breast and pelvic exam was spent in direct face to face counseling and coordination of care in regards to her  osteoporosis, discussion of treatment options and ultimate decision to proceed with Prolia.    Anastasio Auerbach MD, 12:28 PM 07/07/2016

## 2016-07-08 LAB — TSH: TSH: 2.03 m[IU]/L

## 2016-07-08 LAB — VITAMIN D 25 HYDROXY (VIT D DEFICIENCY, FRACTURES): Vit D, 25-Hydroxy: 43 ng/mL (ref 30–100)

## 2016-07-08 LAB — PARATHYROID HORMONE, INTACT (NO CA): PTH: 73 pg/mL — AB (ref 14–64)

## 2016-07-09 ENCOUNTER — Other Ambulatory Visit: Payer: Self-pay | Admitting: Gynecology

## 2016-07-09 DIAGNOSIS — E349 Endocrine disorder, unspecified: Secondary | ICD-10-CM

## 2016-07-13 ENCOUNTER — Other Ambulatory Visit: Payer: Self-pay | Admitting: Rheumatology

## 2016-07-16 NOTE — Telephone Encounter (Signed)
02/21/16 last visit 02/18/16 labs WNL Eye exam was due in Sept  Will call patient  Next visit 08/04/16

## 2016-07-17 NOTE — Telephone Encounter (Signed)
Spoke with patient and she had her PLQ eye exam done on June 03, 2016 and everything was WNL. Patient had it performed by Dr. Melissa Noon with [facility]. She is going to have them fax the results of her eye exam.   Okay to refill PLQ?

## 2016-07-23 DIAGNOSIS — M542 Cervicalgia: Secondary | ICD-10-CM | POA: Diagnosis not present

## 2016-07-23 DIAGNOSIS — M5013 Cervical disc disorder with radiculopathy, cervicothoracic region: Secondary | ICD-10-CM | POA: Diagnosis not present

## 2016-08-01 ENCOUNTER — Encounter (INDEPENDENT_AMBULATORY_CARE_PROVIDER_SITE_OTHER): Payer: Self-pay

## 2016-08-01 DIAGNOSIS — E559 Vitamin D deficiency, unspecified: Secondary | ICD-10-CM | POA: Insufficient documentation

## 2016-08-01 DIAGNOSIS — I7300 Raynaud's syndrome without gangrene: Secondary | ICD-10-CM | POA: Insufficient documentation

## 2016-08-01 DIAGNOSIS — M329 Systemic lupus erythematosus, unspecified: Secondary | ICD-10-CM | POA: Insufficient documentation

## 2016-08-01 DIAGNOSIS — Z8719 Personal history of other diseases of the digestive system: Secondary | ICD-10-CM | POA: Insufficient documentation

## 2016-08-01 DIAGNOSIS — M359 Systemic involvement of connective tissue, unspecified: Secondary | ICD-10-CM | POA: Insufficient documentation

## 2016-08-01 DIAGNOSIS — Z8679 Personal history of other diseases of the circulatory system: Secondary | ICD-10-CM | POA: Insufficient documentation

## 2016-08-01 DIAGNOSIS — M722 Plantar fascial fibromatosis: Secondary | ICD-10-CM | POA: Insufficient documentation

## 2016-08-01 DIAGNOSIS — L568 Other specified acute skin changes due to ultraviolet radiation: Secondary | ICD-10-CM | POA: Insufficient documentation

## 2016-08-01 NOTE — Progress Notes (Signed)
Office Visit Note  Patient: Theresa Graham             Date of Birth: Dec 02, 1950           MRN: [id number]             PCP: Reginia Naas, MD Referring: Carol Ada, MD Visit Date: 08/04/2016 Occupation: Part-time Environmental manager    Subjective:  Left lower extremity pain  History of Present Illness: SHIANNA Graham is a 66 y.o. female with history of systemic lupus erythematosus. She states in November 2017 and she was doing a spinning class at the gym and sprained her left knee. She's been having pain in her left knee and left lower extremity since then. She went to see her PCP who referred her to Dr. Rhona Raider. Who injected her left knee joint and diagnosed her with left IT band syndrome. She states her knee is doing better but she continues to have discomfort in the left lower extremity. She reports intermittent swelling in her hands and ankle joints. She also had a recent bone density from which was consistent with osteoporosis and she would like to discuss that further today. She was treated with Fosamax several years ago and stopped it about 7-8 years ago. She had no side effects from Fosamax.  Activities of Daily Living:  Patient reports morning stiffness for 30 minutes.   Patient Denies nocturnal pain.  Difficulty dressing/grooming: Denies Difficulty climbing stairs: Reports Difficulty getting out of chair: Denies Difficulty using hands for taps, buttons, cutlery, and/or writing: Denies   Review of Systems  Constitutional: Positive for fatigue. Negative for night sweats, weight gain, weight loss and weakness.  HENT: Negative for mouth sores, trouble swallowing, trouble swallowing, mouth dryness and nose dryness.   Eyes: Negative for pain, redness, visual disturbance and dryness.  Respiratory: Negative for cough, shortness of breath and difficulty breathing.   Cardiovascular: Negative for chest pain, palpitations, hypertension, irregular heartbeat and swelling in  legs/feet.  Gastrointestinal: Negative for blood in stool, constipation and diarrhea.  Endocrine: Negative for increased urination.  Genitourinary: Negative for vaginal dryness.  Musculoskeletal: Positive for arthralgias, joint pain, myalgias, morning stiffness and myalgias. Negative for joint swelling, muscle weakness and muscle tenderness.  Skin: Negative for color change, rash, hair loss, skin tightness, ulcers and sensitivity to sunlight.  Allergic/Immunologic: Negative for susceptible to infections.  Neurological: Negative for dizziness, memory loss and night sweats.  Hematological: Negative for swollen glands.  Psychiatric/Behavioral: Positive for sleep disturbance. Negative for depressed mood. The patient is not nervous/anxious.     PMFS History:  Patient Active Problem List   Diagnosis Date Noted  . Systemic lupus erythematosus ([place]) 08/01/2016  . Vitamin D deficiency 08/01/2016  . History of IBS 08/01/2016  . Plantar fasciitis 08/01/2016  . Raynaud's disease without gangrene 08/01/2016  . History of hypertension 08/01/2016  . High risk medication use 06/18/2016  . Hypertension   . Osteopenia   . HSV (herpes simplex virus) infection   . Headache(784.0)   . Lupus     Past Medical History:  Diagnosis Date  . HSV (herpes simplex virus) infection   . Hypertension   . Lupus    sle and discoid lupus  . Osteoporosis 2017   T score -3.3 at [facility]    Family History  Problem Relation Age of Onset  . Diabetes Mother   . Hypertension Mother   . Thyroid disease Mother   . Heart disease Mother   . Breast  cancer Mother     Age 72  . Diabetes Father   . Hypertension Father   . Cancer Father     COLON  . Heart disease Father   . Diabetes Sister   . Crohn's disease Sister   . Hypertension Sister   . Stroke Sister   . Hypertension Brother   . Colitis Sister   . Diabetes Maternal Grandmother   . Diabetes Paternal Grandmother   . Diabetes Paternal Grandfather    Past  Surgical History:  Procedure Laterality Date  . ABDOMINAL HYSTERECTOMY  1987   TAH  . FOOT SURGERY    . ROTATOR CUFF REPAIR  2006  . SHOULDER SURGERY FOR BONE SPUR  2010  . STRESS FRACTURE LEG  08/2007  . TUBAL LIGATION  1978   Social History   Social History Narrative  . No narrative on file     Objective: Vital Signs: BP 116/70 (BP Location: Left Arm, Patient Position: Sitting, Cuff Size: Large)   Pulse 71   Resp 12   Ht 5' 4.5" (1.638 m)   Wt 150 lb (68 kg)   BMI 25.35 kg/m    Physical Exam  Constitutional: She is oriented to person, place, and time. She appears well-developed and well-nourished.  HENT:  Head: Normocephalic and atraumatic.  Eyes: Conjunctivae and EOM are normal.  Neck: Normal range of motion.  Cardiovascular: Normal rate, regular rhythm, normal heart sounds and intact distal pulses.   Pulmonary/Chest: Effort normal and breath sounds normal.  Abdominal: Soft. Bowel sounds are normal.  Lymphadenopathy:    She has no cervical adenopathy.  Neurological: She is alert and oriented to person, place, and time.  Skin: Skin is warm and dry. Capillary refill takes less than 2 seconds.  Psychiatric: She has a normal mood and affect. Her behavior is normal.  Nursing note and vitals reviewed.    Musculoskeletal Exam: C-spine and thoracic lumbar spine good range of motion. Shoulder joints of the joints wrist joint MCPs PIPs DIPs with good range of motion with no synovitis hip joints knee joints ankles MTPs PIPs with good range of motion with no synovitis she had tenderness on palpation of her left trochanteric bursa and the left IT band.  CDAI Exam: No CDAI exam completed.    Investigation: Findings:    DEXA was done 05/10/2014, shows a T-score of -2, which is consistent with osteopenia.   Labs from February 18, 2016 show CMP with GFR normal; CBC with diff is normal; sed rate normal at 1; C3, C4 is normal; ANA is positive with a titer of 1:40; double-stranded DNA  is normal; urinalysis is normal; vitamin D is normal at 37.   Office Visit on 07/07/2016  Component Date Value Ref Range Status  . Vit D, 25-Hydroxy 07/08/2016 43  30 - 100 ng/mL Final   Comment: Vitamin D Status           25-OH Vitamin D        Deficiency                <20 ng/mL        Insufficiency         20 - 29 ng/mL        Optimal             > or = 30 ng/mL   For 25-OH Vitamin D testing on patients on D2-supplementation and patients for whom quantitation of D2 and D3 fractions is required, the [employer]  25-OH VIT D, (D2,D3), LC/MS/MS is recommended: order code [id number] (patients > 2 yrs).   . TSH 07/08/2016 2.03  mIU/L Final   Comment:   Reference Range   > or = 20 Years  0.40-4.50   Pregnancy Range First trimester  0.26-2.66 Second trimester 0.55-2.73 Third trimester  0.43-2.91     . PTH 07/08/2016 73* 14 - 64 pg/mL Final   Comment:   Interpretive Guide:                              Intact PTH               Calcium                              ----------               ------- Normal Parathyroid           Normal                   Normal Hypoparathyroidism           Low or Low Normal        Low Hyperparathyroidism      Primary                 Normal or High           High      Secondary               High                     Normal or Low      Tertiary                High                     High Non-Parathyroid   Hypercalcemia              Low or Low Normal        High      Imaging: No results found.  Speciality Comments: No specialty comments available.    Procedures:  No procedures performed Allergies: Patient has no known allergies.   Assessment / Plan:     Visit Diagnoses: Systemic lupus erythematosus - Positive ANA, positive Smith, positive RNP, positive Ro, positive Raynauds, positive photosensitivity, positive arthritis - I will obtain following labs to monitor  her disease activity Plan: Urinalysis, Routine w reflex microscopic, C3 and C4,  Anti-DNA antibody, double-stranded, Sedimentation rate. She's been fairly well with no synovitis no recent problems. Her Raynauds is mildly active during the wintertime. I'll obtain labs today. She wants to decrease her Plaquenil dose . I discussed decreasing her Plaquenil to one tablet every other day.  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  High risk medication use - Plaquenil 200 mg 1 AM. She will decrease Plaquenil to one every other day. - Plan: CBC with Differential/Platelet, COMPLETE METABOLIC PANEL WITH GFR  Raynaud's disease without gangrene  Age-related osteoporosis without current pathological fracture - DEXA 05/23/2016 T score -3.3 lumbar spine, BMD 0.784 which is decreased by -5%., The lowest drop in the BMD was -9% than the right femoral region. We had detailed discussion regarding osteoporosis. She's been treated with Fosamax several years ago and had been off Fosamax for several years. Different  treatment options and their side effects were discussed at length.. After reviewing indications side effects contraindications by me and Dr. Koleen Nimrod our pharmacist she was more inclined towards going on Fosamax again. A prescription for Fosamax 70 mg by mouth every week 90 day supply with one refill was given. Use of calcium and vitamin D and resistive exercise was also emphasized.  Vitamin D deficiency: She is on supplement.  History of IBS  Plantar fasciitis: It is not currently active.  History of hypertension  Trochanteric bursitis of left hip : Physical therapy prescription was given for her left IT band syndrome. Exercises were also demonstrated in the office.   Orders: Orders Placed This Encounter  Procedures  . CBC with Differential/Platelet  . COMPLETE METABOLIC PANEL WITH GFR  . Urinalysis, Routine w reflex microscopic  . C3 and C4  . Anti-DNA antibody, double-stranded  . Sedimentation rate   No orders of the defined types were placed in this  encounter.   Face-to-face time spent with patient was 30 minutes. 50% of time was spent in counseling and coordination of care.  Follow-Up Instructions: Return in about 5 months (around 01/02/2017) for Systemic lupus.   Bo Merino, MD  Note - This record has been created using Editor, commissioning.  Chart creation errors have been sought, but may not always  have been located. Such creation errors do not reflect on  the standard of medical care.

## 2016-08-04 ENCOUNTER — Encounter: Payer: Self-pay | Admitting: Rheumatology

## 2016-08-04 ENCOUNTER — Ambulatory Visit (INDEPENDENT_AMBULATORY_CARE_PROVIDER_SITE_OTHER): Payer: Medicare Other | Admitting: Rheumatology

## 2016-08-04 VITALS — BP 116/70 | HR 71 | Resp 12 | Ht 64.5 in | Wt 150.0 lb

## 2016-08-04 DIAGNOSIS — M810 Age-related osteoporosis without current pathological fracture: Secondary | ICD-10-CM | POA: Diagnosis not present

## 2016-08-04 DIAGNOSIS — M722 Plantar fascial fibromatosis: Secondary | ICD-10-CM

## 2016-08-04 DIAGNOSIS — L930 Discoid lupus erythematosus: Secondary | ICD-10-CM

## 2016-08-04 DIAGNOSIS — Z8719 Personal history of other diseases of the digestive system: Secondary | ICD-10-CM | POA: Diagnosis not present

## 2016-08-04 DIAGNOSIS — E559 Vitamin D deficiency, unspecified: Secondary | ICD-10-CM

## 2016-08-04 DIAGNOSIS — M3219 Other organ or system involvement in systemic lupus erythematosus: Secondary | ICD-10-CM | POA: Diagnosis not present

## 2016-08-04 DIAGNOSIS — I7300 Raynaud's syndrome without gangrene: Secondary | ICD-10-CM

## 2016-08-04 DIAGNOSIS — M7062 Trochanteric bursitis, left hip: Secondary | ICD-10-CM | POA: Diagnosis not present

## 2016-08-04 DIAGNOSIS — Z79899 Other long term (current) drug therapy: Secondary | ICD-10-CM

## 2016-08-04 DIAGNOSIS — Z8679 Personal history of other diseases of the circulatory system: Secondary | ICD-10-CM

## 2016-08-04 LAB — CBC WITH DIFFERENTIAL/PLATELET
BASOS ABS: 49 {cells}/uL (ref 0–200)
Basophils Relative: 1 %
Eosinophils Absolute: 392 cells/uL (ref 15–500)
Eosinophils Relative: 8 %
HEMATOCRIT: 39.7 % (ref 35.0–45.0)
Hemoglobin: 12.9 g/dL (ref 11.7–15.5)
LYMPHS ABS: 1225 {cells}/uL (ref 850–3900)
LYMPHS PCT: 25 %
MCH: 27.3 pg (ref 27.0–33.0)
MCHC: 32.5 g/dL (ref 32.0–36.0)
MCV: 83.9 fL (ref 80.0–100.0)
MONO ABS: 392 {cells}/uL (ref 200–950)
MPV: 9.7 fL (ref 7.5–12.5)
Monocytes Relative: 8 %
Neutro Abs: 2842 cells/uL (ref 1500–7800)
Neutrophils Relative %: 58 %
Platelets: 299 10*3/uL (ref 140–400)
RBC: 4.73 MIL/uL (ref 3.80–5.10)
RDW: 14.3 % (ref 11.0–15.0)
WBC: 4.9 10*3/uL (ref 3.8–10.8)

## 2016-08-04 LAB — COMPLETE METABOLIC PANEL WITH GFR
ALT: 17 U/L (ref 6–29)
AST: 12 U/L (ref 10–35)
Albumin: 4.3 g/dL (ref 3.6–5.1)
Alkaline Phosphatase: 69 U/L (ref 33–130)
BUN: 17 mg/dL (ref 7–25)
CALCIUM: 9.6 mg/dL (ref 8.6–10.4)
CHLORIDE: 103 mmol/L (ref 98–110)
CO2: 24 mmol/L (ref 20–31)
CREATININE: 0.87 mg/dL (ref 0.50–0.99)
GFR, Est African American: 81 mL/min (ref >=60)
GFR, Est Non African American: 70 mL/min (ref >=60)
Glucose, Bld: 105 mg/dL — ABNORMAL HIGH (ref 65–99)
POTASSIUM: 3.7 mmol/L (ref 3.5–5.3)
Sodium: 139 mmol/L (ref 135–146)
Total Bilirubin: 0.5 mg/dL (ref 0.2–1.2)
Total Protein: 6.9 g/dL (ref 6.1–8.1)

## 2016-08-04 MED ORDER — ALENDRONATE SODIUM 70 MG PO TABS: 70.0000 mg | ORAL_TABLET | ORAL | 1 refills | Status: DC

## 2016-08-04 NOTE — Progress Notes (Signed)
Pharmacy Note  Subjective: Patient presents today to the [facility] to see Dr. Estanislado Pandy.  Patient seen by pharmacist for counseling on bisphosphonate therapy.    Objective: T-score: -3.3 (05/23/16) Calcium: 9.3 mg/dL (02/18/16) Vitamin D: 37 (02/18/16)  Assessment/Plan: 1. Medication review:  Counseled patient that Fosamax is an oral bisphosphonate that reduces bone turnover by inhibiting osteoclasts that chew up bone.  Counseled patient on purpose, proper use, and adverse effects of Fosamax.  Reviewed with patient the Fosamax should be taken once weekly.  Fosamax must be taking first thing in the morning, with a full glass of water, and she must wait an hour prior to eating food.  Also advised patient that she should not lie down until after she has eaten.  Reviewed importance of taking calcium and vitamin D with bisphosphonate therapy.  Patient confirms she is already taking calcium/vitamin D.  Provided patient with medication education material and answered all questions.  Reviewed adverse events of Fosamax including risk of nausea & diarrhea, headache, and muscle & bone pain.  Reviewed rare adverse effect of osteonecrosis of the jaw and advised patient to alert her dentist that she is on Fosamax prior to any major dental work.  Patient confirms she does not have any major dental work scheduled at this time.  Patient agrees to initiation of Fosamax at this time.     Elisabeth Most, Pharm.D., BCPS Clinical Pharmacist Pager: [phone] Phone: [phone] 08/04/2016 10:14 AM

## 2016-08-04 NOTE — Patient Instructions (Signed)
Alendronate tablets What is this medicine? ALENDRONATE (a LEN droe nate) slows calcium loss from bones. It helps to make normal healthy bone and to slow bone loss in people with Paget's disease and osteoporosis. It may be used in others at risk for bone loss. This medicine may be used for other purposes; ask your health care provider or pharmacist if you have questions. COMMON BRAND NAME(S): Fosamax What should I tell my health care provider before I take this medicine? They need to know if you have any of these conditions: -dental disease -esophagus, stomach, or intestine problems, like acid reflux or GERD -kidney disease -low blood calcium -low vitamin D -problems sitting or standing 30 minutes -trouble swallowing -an unusual or allergic reaction to alendronate, other medicines, foods, dyes, or preservatives -pregnant or trying to get pregnant -breast-feeding How should I use this medicine? You must take this medicine exactly as directed or you will lower the amount of the medicine you absorb into your body or you may cause yourself harm. Take this medicine by mouth first thing in the morning, after you are up for the day. Do not eat or drink anything before you take your medicine. Swallow the tablet with a full glass (6 to 8 fluid ounces) of plain water. Do not take this medicine with any other drink. Do not chew or crush the tablet. After taking this medicine, do not eat breakfast, drink, or take any medicines or vitamins for at least 30 minutes. Sit or stand up for at least 30 minutes after you take this medicine; do not lie down. Do not take your medicine more often than directed. Talk to your pediatrician regarding the use of this medicine in children. Special care may be needed. Overdosage: If you think you have taken too much of this medicine contact a poison control center or emergency room at once. NOTE: This medicine is only for you. Do not share this medicine with others. What if I  miss a dose? If you miss a dose, do not take it later in the day. Continue your normal schedule starting the next morning. Do not take double or extra doses. What may interact with this medicine? -aluminum hydroxide -antacids -aspirin -calcium supplements -drugs for inflammation like ibuprofen, naproxen, and others -iron supplements -magnesium supplements -vitamins with minerals This list may not describe all possible interactions. Give your health care provider a list of all the medicines, herbs, non-prescription drugs, or dietary supplements you use. Also tell them if you smoke, drink alcohol, or use illegal drugs. Some items may interact with your medicine. What should I watch for while using this medicine? Visit your doctor or health care professional for regular checks ups. It may be some time before you see benefit from this medicine. Do not stop taking your medicine except on your doctor's advice. Your doctor or health care professional may order blood tests and other tests to see how you are doing. You should make sure you get enough calcium and vitamin D while you are taking this medicine, unless your doctor tells you not to. Discuss the foods you eat and the vitamins you take with your health care professional. Some people who take this medicine have severe bone, joint, and/or muscle pain. This medicine may also increase your risk for a broken thigh bone. Tell your doctor right away if you have pain in your upper leg or groin. Tell your doctor if you have any pain that does not go away or that gets worse.  This medicine can make you more sensitive to the sun. If you get a rash while taking this medicine, sunlight may cause the rash to get worse. Keep out of the sun. If you cannot avoid being in the sun, wear protective clothing and use sunscreen. Do not use sun lamps or tanning beds/booths. What side effects may I notice from receiving this medicine? Side effects that you should report to  your doctor or health care professional as soon as possible: -allergic reactions like skin rash, itching or hives, swelling of the face, lips, or tongue -black or tarry stools -bone, muscle or joint pain -changes in vision -chest pain -heartburn or stomach pain -jaw pain, especially after dental work -pain or trouble when swallowing -redness, blistering, peeling or loosening of the skin, including inside the mouth Side effects that usually do not require medical attention (report to your doctor or health care professional if they continue or are bothersome): -changes in taste -diarrhea or constipation -eye pain or itching -headache -nausea or vomiting -stomach gas or fullness This list may not describe all possible side effects. Call your doctor for medical advice about side effects. You may report side effects to FDA at 1-800-FDA-1088. Where should I keep my medicine? Keep out of the reach of children. Store at room temperature of 15 and 30 degrees C (59 and 86 degrees F). Throw away any unused medicine after the expiration date. NOTE: This sheet is a summary. It may not cover all possible information. If you have questions about this medicine, talk to your doctor, pharmacist, or health care provider.  2017 Elsevier/Gold Standard (2011-01-03 08:56:09)

## 2016-08-05 LAB — ANTI-DNA ANTIBODY, DOUBLE-STRANDED: ds DNA Ab: <1 IU/mL

## 2016-08-05 LAB — URINALYSIS, ROUTINE W REFLEX MICROSCOPIC
Bilirubin Urine: NEGATIVE
Glucose, UA: NEGATIVE
Hgb urine dipstick: NEGATIVE
KETONES UR: NEGATIVE
NITRITE: NEGATIVE
Protein, ur: NEGATIVE
SPECIFIC GRAVITY, URINE: 1.022 (ref 1.001–1.035)
pH: 6.5 (ref 5.0–8.0)

## 2016-08-05 LAB — C3 AND C4
C3 COMPLEMENT: 132 mg/dL (ref 90–180)
C4 Complement: 25 mg/dL (ref 16–47)

## 2016-08-05 LAB — URINALYSIS, MICROSCOPIC ONLY
BACTERIA UA: NONE SEEN [HPF]
CASTS: NONE SEEN [LPF]
Crystals: NONE SEEN [HPF]
Squamous Epithelial / LPF: NONE SEEN [HPF] (ref <=5)
YEAST: NONE SEEN [HPF]

## 2016-08-05 LAB — SEDIMENTATION RATE: Sed Rate: 4 mm/hr (ref 0–30)

## 2016-08-05 NOTE — Progress Notes (Signed)
Labs are normal.

## 2016-08-11 ENCOUNTER — Other Ambulatory Visit: Payer: Self-pay | Admitting: Rheumatology

## 2016-08-11 DIAGNOSIS — M542 Cervicalgia: Secondary | ICD-10-CM | POA: Diagnosis not present

## 2016-08-11 DIAGNOSIS — M5013 Cervical disc disorder with radiculopathy, cervicothoracic region: Secondary | ICD-10-CM | POA: Diagnosis not present

## 2016-08-11 DIAGNOSIS — M25562 Pain in left knee: Secondary | ICD-10-CM | POA: Diagnosis not present

## 2016-08-11 NOTE — Telephone Encounter (Signed)
Patient was returning Andrea's call about lab results and has a few questions about the results.   She is also requesting a refill of hydroxychloroquine  to be sent to [facility].

## 2016-08-12 MED ORDER — HYDROXYCHLOROQUINE SULFATE 200 MG PO TABS: 200.0000 mg | ORAL_TABLET | ORAL | 1 refills | Status: DC

## 2016-08-12 NOTE — Telephone Encounter (Signed)
Last Visit: 08/04/16 Next Visit due June 2018. Message sent to the front to schedule patient. Labs: 08/04/16 WNL PLQ Eye Exam: 06/03/16 WNL  Okay to refill PLQ?

## 2016-08-12 NOTE — Telephone Encounter (Signed)
ok 

## 2016-08-13 ENCOUNTER — Telehealth: Payer: Self-pay | Admitting: Rheumatology

## 2016-08-13 NOTE — Telephone Encounter (Signed)
Patient called about PHT results from last week? She would like to know if that was done and what the results were. If it wasn't done, patient would like to know if she should come back and have that done. Please call patient.

## 2016-08-13 NOTE — Telephone Encounter (Signed)
Patient advised that a PTH was not ordered or drawn with her labs last week. Patient will follow u pwith her gynecologist since they were the ones who ordered the first one that came back abnormal.

## 2016-08-14 DIAGNOSIS — M542 Cervicalgia: Secondary | ICD-10-CM | POA: Diagnosis not present

## 2016-08-14 DIAGNOSIS — M5013 Cervical disc disorder with radiculopathy, cervicothoracic region: Secondary | ICD-10-CM | POA: Diagnosis not present

## 2016-08-15 DIAGNOSIS — M7632 Iliotibial band syndrome, left leg: Secondary | ICD-10-CM | POA: Diagnosis not present

## 2016-08-15 DIAGNOSIS — M542 Cervicalgia: Secondary | ICD-10-CM | POA: Diagnosis not present

## 2016-08-15 DIAGNOSIS — M5013 Cervical disc disorder with radiculopathy, cervicothoracic region: Secondary | ICD-10-CM | POA: Diagnosis not present

## 2016-08-20 DIAGNOSIS — M542 Cervicalgia: Secondary | ICD-10-CM | POA: Diagnosis not present

## 2016-08-20 DIAGNOSIS — M5013 Cervical disc disorder with radiculopathy, cervicothoracic region: Secondary | ICD-10-CM | POA: Diagnosis not present

## 2016-08-20 DIAGNOSIS — M7632 Iliotibial band syndrome, left leg: Secondary | ICD-10-CM | POA: Diagnosis not present

## 2016-08-22 DIAGNOSIS — M5013 Cervical disc disorder with radiculopathy, cervicothoracic region: Secondary | ICD-10-CM | POA: Diagnosis not present

## 2016-08-22 DIAGNOSIS — M7632 Iliotibial band syndrome, left leg: Secondary | ICD-10-CM | POA: Diagnosis not present

## 2016-08-22 DIAGNOSIS — M542 Cervicalgia: Secondary | ICD-10-CM | POA: Diagnosis not present

## 2016-08-25 DIAGNOSIS — M542 Cervicalgia: Secondary | ICD-10-CM | POA: Diagnosis not present

## 2016-08-25 DIAGNOSIS — M7632 Iliotibial band syndrome, left leg: Secondary | ICD-10-CM | POA: Diagnosis not present

## 2016-08-25 DIAGNOSIS — M5013 Cervical disc disorder with radiculopathy, cervicothoracic region: Secondary | ICD-10-CM | POA: Diagnosis not present

## 2016-08-27 DIAGNOSIS — M5013 Cervical disc disorder with radiculopathy, cervicothoracic region: Secondary | ICD-10-CM | POA: Diagnosis not present

## 2016-08-27 DIAGNOSIS — M542 Cervicalgia: Secondary | ICD-10-CM | POA: Diagnosis not present

## 2016-08-27 DIAGNOSIS — M7632 Iliotibial band syndrome, left leg: Secondary | ICD-10-CM | POA: Diagnosis not present

## 2016-08-29 DIAGNOSIS — M542 Cervicalgia: Secondary | ICD-10-CM | POA: Diagnosis not present

## 2016-08-29 DIAGNOSIS — M7632 Iliotibial band syndrome, left leg: Secondary | ICD-10-CM | POA: Diagnosis not present

## 2016-08-29 DIAGNOSIS — M5013 Cervical disc disorder with radiculopathy, cervicothoracic region: Secondary | ICD-10-CM | POA: Diagnosis not present

## 2016-09-01 DIAGNOSIS — M542 Cervicalgia: Secondary | ICD-10-CM | POA: Diagnosis not present

## 2016-09-01 DIAGNOSIS — M5013 Cervical disc disorder with radiculopathy, cervicothoracic region: Secondary | ICD-10-CM | POA: Diagnosis not present

## 2016-09-01 DIAGNOSIS — M7632 Iliotibial band syndrome, left leg: Secondary | ICD-10-CM | POA: Diagnosis not present

## 2016-09-05 DIAGNOSIS — M542 Cervicalgia: Secondary | ICD-10-CM | POA: Diagnosis not present

## 2016-09-05 DIAGNOSIS — M7632 Iliotibial band syndrome, left leg: Secondary | ICD-10-CM | POA: Diagnosis not present

## 2016-09-05 DIAGNOSIS — M5013 Cervical disc disorder with radiculopathy, cervicothoracic region: Secondary | ICD-10-CM | POA: Diagnosis not present

## 2016-09-08 DIAGNOSIS — M5013 Cervical disc disorder with radiculopathy, cervicothoracic region: Secondary | ICD-10-CM | POA: Diagnosis not present

## 2016-09-08 DIAGNOSIS — M542 Cervicalgia: Secondary | ICD-10-CM | POA: Diagnosis not present

## 2016-09-08 DIAGNOSIS — M7632 Iliotibial band syndrome, left leg: Secondary | ICD-10-CM | POA: Diagnosis not present

## 2016-09-09 ENCOUNTER — Telehealth: Payer: Self-pay | Admitting: Rheumatology

## 2016-09-09 NOTE — Telephone Encounter (Signed)
Patient called and stated that she saw Dr. Marjory Lies in January and did not get a cortisone injection and forgot to ask about.  She is now having right knee pain and would like to get into either Dr. Marjory Lies or Mr. Carlyon Shadow for that injection since they are the ones that usually give her the shot.  She is going on vacation on March 7 and will be doing a lot of walking.  She wants to know if she can be fit in the schedule to receive the injection.  Cb#[phone].

## 2016-09-09 NOTE — Telephone Encounter (Signed)
Per Mr. Theresa Graham okay to work patient for a cortisone injection. Patient has been scheduled for injection on 09/10/16 at 3:30 pm.

## 2016-09-10 ENCOUNTER — Ambulatory Visit (INDEPENDENT_AMBULATORY_CARE_PROVIDER_SITE_OTHER): Payer: Medicare Other | Admitting: Rheumatology

## 2016-09-10 DIAGNOSIS — G8929 Other chronic pain: Secondary | ICD-10-CM | POA: Diagnosis not present

## 2016-09-10 DIAGNOSIS — M25561 Pain in right knee: Secondary | ICD-10-CM

## 2016-09-10 MED ORDER — TRIAMCINOLONE ACETONIDE 40 MG/ML IJ SUSP
40.0000 mg | INTRAMUSCULAR | Status: AC | PRN
  Administered 2016-09-10: 40 mg via INTRA_ARTICULAR

## 2016-09-10 MED ORDER — LIDOCAINE HCL 1 % IJ SOLN
1.5000 mL | INTRAMUSCULAR | Status: AC | PRN
  Administered 2016-09-10: 1.5 mL

## 2016-09-10 NOTE — Progress Notes (Signed)
   Procedure Note  Patient: Theresa Graham             Date of Birth: [birth year]           MRN: [id number]             Visit Date: 09/10/2016  Procedures: Visit Diagnoses: No diagnosis found.   BP-119/69, Pulse 63, Resp. 12  Large Joint Inj Date/Time: 09/10/2016 4:23 PM Performed by: Eliezer Lofts Authorized by: Eliezer Lofts   Consent Given by:  Patient Site marked: the procedure site was marked   Timeout: prior to procedure the correct patient, procedure, and site was verified   Indications:  Pain and joint swelling Location:  Knee Site:  R knee Prep: patient was prepped and draped in usual sterile fashion   Needle Size:  27 G Needle Length:  1.5 inches Approach:  Medial Ultrasound Guidance: No   Fluoroscopic Guidance: No   Arthrogram: No   Medications:  1.5 mL lidocaine 1 %; 40 mg triamcinolone acetonide 40 MG/ML Aspiration Attempted: Yes   Patient tolerance:  Patient tolerated the procedure well with no immediate complications  Right knee pain for couple of weeks No injury. After informed consent was obtained the site was prepped in usual sterile fashion and injected with 40 mg of Kenalog mixed with one half mL's of 1% lidocaine. Patient tolerated procedure well. There are no complications

## 2016-09-11 DIAGNOSIS — M5013 Cervical disc disorder with radiculopathy, cervicothoracic region: Secondary | ICD-10-CM | POA: Diagnosis not present

## 2016-09-11 DIAGNOSIS — M7632 Iliotibial band syndrome, left leg: Secondary | ICD-10-CM | POA: Diagnosis not present

## 2016-09-11 DIAGNOSIS — M542 Cervicalgia: Secondary | ICD-10-CM | POA: Diagnosis not present

## 2016-09-15 DIAGNOSIS — M542 Cervicalgia: Secondary | ICD-10-CM | POA: Diagnosis not present

## 2016-09-15 DIAGNOSIS — M7632 Iliotibial band syndrome, left leg: Secondary | ICD-10-CM | POA: Diagnosis not present

## 2016-09-15 DIAGNOSIS — M5013 Cervical disc disorder with radiculopathy, cervicothoracic region: Secondary | ICD-10-CM | POA: Diagnosis not present

## 2016-09-19 DIAGNOSIS — M7632 Iliotibial band syndrome, left leg: Secondary | ICD-10-CM | POA: Diagnosis not present

## 2016-09-19 DIAGNOSIS — M5013 Cervical disc disorder with radiculopathy, cervicothoracic region: Secondary | ICD-10-CM | POA: Diagnosis not present

## 2016-09-19 DIAGNOSIS — M542 Cervicalgia: Secondary | ICD-10-CM | POA: Diagnosis not present

## 2016-09-22 DIAGNOSIS — M25562 Pain in left knee: Secondary | ICD-10-CM | POA: Diagnosis not present

## 2016-09-22 DIAGNOSIS — M7632 Iliotibial band syndrome, left leg: Secondary | ICD-10-CM | POA: Diagnosis not present

## 2016-09-22 DIAGNOSIS — M25512 Pain in left shoulder: Secondary | ICD-10-CM | POA: Diagnosis not present

## 2016-09-22 DIAGNOSIS — M5013 Cervical disc disorder with radiculopathy, cervicothoracic region: Secondary | ICD-10-CM | POA: Diagnosis not present

## 2016-09-22 DIAGNOSIS — M542 Cervicalgia: Secondary | ICD-10-CM | POA: Diagnosis not present

## 2016-10-08 DIAGNOSIS — M542 Cervicalgia: Secondary | ICD-10-CM | POA: Diagnosis not present

## 2016-10-08 DIAGNOSIS — M5013 Cervical disc disorder with radiculopathy, cervicothoracic region: Secondary | ICD-10-CM | POA: Diagnosis not present

## 2016-10-08 DIAGNOSIS — M7632 Iliotibial band syndrome, left leg: Secondary | ICD-10-CM | POA: Diagnosis not present

## 2016-10-10 DIAGNOSIS — M7632 Iliotibial band syndrome, left leg: Secondary | ICD-10-CM | POA: Diagnosis not present

## 2016-10-10 DIAGNOSIS — M542 Cervicalgia: Secondary | ICD-10-CM | POA: Diagnosis not present

## 2016-10-10 DIAGNOSIS — M5013 Cervical disc disorder with radiculopathy, cervicothoracic region: Secondary | ICD-10-CM | POA: Diagnosis not present

## 2016-10-13 ENCOUNTER — Other Ambulatory Visit: Payer: Self-pay | Admitting: Rheumatology

## 2016-10-13 DIAGNOSIS — M7632 Iliotibial band syndrome, left leg: Secondary | ICD-10-CM | POA: Diagnosis not present

## 2016-10-13 DIAGNOSIS — M542 Cervicalgia: Secondary | ICD-10-CM | POA: Diagnosis not present

## 2016-10-13 DIAGNOSIS — M5013 Cervical disc disorder with radiculopathy, cervicothoracic region: Secondary | ICD-10-CM | POA: Diagnosis not present

## 2016-10-13 NOTE — Telephone Encounter (Signed)
ok 

## 2016-10-13 NOTE — Telephone Encounter (Signed)
Last Visit: 08/04/16 Next Visit 01/01/17 Labs: 08/04/16 WNL  Okay to refill Fosamax?

## 2016-10-15 DIAGNOSIS — M5013 Cervical disc disorder with radiculopathy, cervicothoracic region: Secondary | ICD-10-CM | POA: Diagnosis not present

## 2016-10-15 DIAGNOSIS — M542 Cervicalgia: Secondary | ICD-10-CM | POA: Diagnosis not present

## 2016-10-20 DIAGNOSIS — M542 Cervicalgia: Secondary | ICD-10-CM | POA: Diagnosis not present

## 2016-10-20 DIAGNOSIS — M5013 Cervical disc disorder with radiculopathy, cervicothoracic region: Secondary | ICD-10-CM | POA: Diagnosis not present

## 2016-10-20 DIAGNOSIS — M7632 Iliotibial band syndrome, left leg: Secondary | ICD-10-CM | POA: Diagnosis not present

## 2016-10-22 DIAGNOSIS — J309 Allergic rhinitis, unspecified: Secondary | ICD-10-CM | POA: Diagnosis not present

## 2016-10-22 DIAGNOSIS — M8580 Other specified disorders of bone density and structure, unspecified site: Secondary | ICD-10-CM | POA: Diagnosis not present

## 2016-10-22 DIAGNOSIS — J029 Acute pharyngitis, unspecified: Secondary | ICD-10-CM | POA: Diagnosis not present

## 2016-10-24 DIAGNOSIS — M7632 Iliotibial band syndrome, left leg: Secondary | ICD-10-CM | POA: Diagnosis not present

## 2016-10-24 DIAGNOSIS — M542 Cervicalgia: Secondary | ICD-10-CM | POA: Diagnosis not present

## 2016-10-24 DIAGNOSIS — M5013 Cervical disc disorder with radiculopathy, cervicothoracic region: Secondary | ICD-10-CM | POA: Diagnosis not present

## 2016-10-27 DIAGNOSIS — M542 Cervicalgia: Secondary | ICD-10-CM | POA: Diagnosis not present

## 2016-10-27 DIAGNOSIS — M5013 Cervical disc disorder with radiculopathy, cervicothoracic region: Secondary | ICD-10-CM | POA: Diagnosis not present

## 2016-10-27 DIAGNOSIS — M7632 Iliotibial band syndrome, left leg: Secondary | ICD-10-CM | POA: Diagnosis not present

## 2016-12-10 DIAGNOSIS — D2371 Other benign neoplasm of skin of right lower limb, including hip: Secondary | ICD-10-CM | POA: Diagnosis not present

## 2016-12-10 DIAGNOSIS — M21611 Bunion of right foot: Secondary | ICD-10-CM | POA: Diagnosis not present

## 2016-12-10 DIAGNOSIS — M79671 Pain in right foot: Secondary | ICD-10-CM | POA: Diagnosis not present

## 2016-12-24 DIAGNOSIS — M25532 Pain in left wrist: Secondary | ICD-10-CM | POA: Diagnosis not present

## 2016-12-24 DIAGNOSIS — L819 Disorder of pigmentation, unspecified: Secondary | ICD-10-CM | POA: Diagnosis not present

## 2016-12-24 DIAGNOSIS — M328 Other forms of systemic lupus erythematosus: Secondary | ICD-10-CM | POA: Diagnosis not present

## 2016-12-24 DIAGNOSIS — R7303 Prediabetes: Secondary | ICD-10-CM | POA: Diagnosis not present

## 2016-12-24 DIAGNOSIS — M818 Other osteoporosis without current pathological fracture: Secondary | ICD-10-CM | POA: Diagnosis not present

## 2016-12-24 DIAGNOSIS — E349 Endocrine disorder, unspecified: Secondary | ICD-10-CM | POA: Diagnosis not present

## 2016-12-24 DIAGNOSIS — I10 Essential (primary) hypertension: Secondary | ICD-10-CM | POA: Diagnosis not present

## 2016-12-24 DIAGNOSIS — K589 Irritable bowel syndrome without diarrhea: Secondary | ICD-10-CM | POA: Diagnosis not present

## 2016-12-25 DIAGNOSIS — M2041 Other hammer toe(s) (acquired), right foot: Secondary | ICD-10-CM | POA: Diagnosis not present

## 2016-12-25 DIAGNOSIS — D2371 Other benign neoplasm of skin of right lower limb, including hip: Secondary | ICD-10-CM | POA: Diagnosis not present

## 2016-12-25 DIAGNOSIS — M79671 Pain in right foot: Secondary | ICD-10-CM | POA: Diagnosis not present

## 2016-12-25 DIAGNOSIS — M21611 Bunion of right foot: Secondary | ICD-10-CM | POA: Diagnosis not present

## 2016-12-25 DIAGNOSIS — K5730 Diverticulosis of large intestine without perforation or abscess without bleeding: Secondary | ICD-10-CM | POA: Diagnosis not present

## 2016-12-25 DIAGNOSIS — K449 Diaphragmatic hernia without obstruction or gangrene: Secondary | ICD-10-CM | POA: Diagnosis not present

## 2016-12-25 DIAGNOSIS — K5904 Chronic idiopathic constipation: Secondary | ICD-10-CM | POA: Diagnosis not present

## 2016-12-25 DIAGNOSIS — K648 Other hemorrhoids: Secondary | ICD-10-CM | POA: Diagnosis not present

## 2016-12-31 ENCOUNTER — Other Ambulatory Visit: Payer: Self-pay | Admitting: Radiology

## 2016-12-31 DIAGNOSIS — M778 Other enthesopathies, not elsewhere classified: Secondary | ICD-10-CM | POA: Diagnosis not present

## 2017-01-01 ENCOUNTER — Ambulatory Visit: Payer: Medicare Other | Admitting: Rheumatology

## 2017-01-08 DIAGNOSIS — L930 Discoid lupus erythematosus: Secondary | ICD-10-CM | POA: Insufficient documentation

## 2017-01-08 DIAGNOSIS — M7062 Trochanteric bursitis, left hip: Secondary | ICD-10-CM | POA: Insufficient documentation

## 2017-01-08 NOTE — Progress Notes (Signed)
Office Visit Note  Patient: Theresa Graham             Date of Birth: July 01, 1951           MRN: [id number]             PCP: Carol Ada, MD Referring: Carol Ada, MD Visit Date: 01/09/2017 Occupation: @GUAROCC @    Subjective:  Knee, ankle and left hand pain.   History of Present Illness: Theresa Graham is a 66 y.o. female with history of systemic lupus erythematosus. She states she continues to have some discomfort in her knees and ankles. She reports intermittent swelling in her knees and ankles. She was also having discomfort in her left wrist. She was seen by Dr. Rhona Raider who diagnosed her with de Quervain's states tenosynovitis and gave her a cortisone injection. She is using a brace on her left and now. She denies any rash. She had a cortisone injection to her right knee joint. She has developed some hypopigmentation in that area. She continues to have some Raynauds in her hands.  Activities of Daily Living:  Patient reports morning stiffness for 5 minutes.   Patient Denies nocturnal pain.  Difficulty dressing/grooming: Denies Difficulty climbing stairs: Reports Difficulty getting out of chair: Denies Difficulty using hands for taps, buttons, cutlery, and/or writing: Denies   Review of Systems  Constitutional: Positive for fatigue. Negative for night sweats, weight gain, weight loss and weakness.  HENT: Positive for mouth dryness. Negative for mouth sores, trouble swallowing, trouble swallowing and nose dryness.   Eyes: Positive for dryness. Negative for pain, redness and visual disturbance.  Respiratory: Negative for cough, shortness of breath and difficulty breathing.   Cardiovascular: Negative for chest pain, palpitations, hypertension, irregular heartbeat and swelling in legs/feet.  Gastrointestinal: Negative for blood in stool, constipation and diarrhea.  Endocrine: Negative for increased urination.  Genitourinary: Negative for vaginal dryness.  Musculoskeletal:  Positive for arthralgias, joint pain and morning stiffness. Negative for joint swelling, myalgias, muscle weakness, muscle tenderness and myalgias.  Skin: Positive for color change and sensitivity to sunlight. Negative for rash, hair loss, skin tightness and ulcers.  Allergic/Immunologic: Negative for susceptible to infections.  Neurological: Negative for dizziness, memory loss and night sweats.  Hematological: Negative for swollen glands.  Psychiatric/Behavioral: Positive for sleep disturbance. Negative for depressed mood. The patient is not nervous/anxious.     PMFS History:  Patient Active Problem List   Diagnosis Date Noted  . Trochanteric bursitis of left hip 01/08/2017  . 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 Age-related osteoporosis without current pathological fracture 08/04/2016  . Systemic lupus erythematosus ([place]) 08/01/2016  . Vitamin D deficiency 08/01/2016  . History of IBS 08/01/2016  . Plantar fasciitis 08/01/2016  . Raynaud's disease without gangrene 08/01/2016  . History of hypertension 08/01/2016  . High risk medication use 06/18/2016  . Hypertension   . HSV (herpes simplex virus) infection   . NIDPOEUM(353.6)     Past Medical History:  Diagnosis Date  . HSV (herpes simplex virus) infection   . Hypertension   . Lupus    sle and discoid lupus  . Osteoporosis 2017   T score -3.3 at [facility]    Family History  Problem Relation Age of Onset  . Diabetes Mother   . Hypertension Mother   . Thyroid disease Mother   . Heart disease Mother   . Breast cancer Mother        Age 62  . Diabetes Father   .  Hypertension Father   . Cancer Father        COLON  . Heart disease Father   . Diabetes Sister   . Crohn's disease Sister   . Hypertension Sister   . Stroke Sister   . Hypertension Brother   . Colitis Sister   . Diabetes Maternal Grandmother   . Diabetes Paternal Grandmother   . Diabetes Paternal Grandfather    Past Surgical History:  Procedure Laterality Date  .  ABDOMINAL HYSTERECTOMY  1987   TAH  . FOOT SURGERY    . ROTATOR CUFF REPAIR  2006  . SHOULDER SURGERY FOR BONE SPUR  2010  . STRESS FRACTURE LEG  08/2007  . TUBAL LIGATION  1978   Social History   Social History Narrative  . No narrative on file     Objective: Vital Signs: BP 113/67   Pulse 79   Resp 14   Ht 5' 4.5" (1.638 m)   Wt 143 lb (64.9 kg)   BMI 24.17 kg/m    Physical Exam  Constitutional: She is oriented to person, place, and time. She appears well-developed and well-nourished.  HENT:  Head: Normocephalic and atraumatic.  Eyes: Conjunctivae and EOM are normal.  Neck: Normal range of motion.  Cardiovascular: Normal rate, regular rhythm, normal heart sounds and intact distal pulses.   Pulmonary/Chest: Effort normal and breath sounds normal.  Abdominal: Soft. Bowel sounds are normal.  Lymphadenopathy:    She has no cervical adenopathy.  Neurological: She is alert and oriented to person, place, and time.  Skin: Skin is warm and dry. Capillary refill takes less than 2 seconds.  A small area of hypopigmentation was noted on her right knee at the previous cortisone injection site.  Psychiatric: She has a normal mood and affect. Her behavior is normal.  Nursing note and vitals reviewed.    Musculoskeletal Exam: C-spine and thoracic lumbar spine good range of motion. Shoulder joints elbow joints wrist joint MCPs PIPs DIPs with good range of motion with no synovitis. Hip joints knee joints ankles MTPs PIPs DIPs with good range of motion with no synovitis.  CDAI Exam: CDAI Homunculus Exam:   Tenderness:  RLE: tibiofemoral LLE: tibiofemoral  Joint Counts:  CDAI Tender Joint count: 2 CDAI Swollen Joint count: 0  Global Assessments:  Patient Global Assessment: 4 Provider Global Assessment: 2  CDAI Calculated Score: 8    Investigation: Findings:  DEXA 05/23/2016 T score -3.3 [facility]  12/24/2016 CMP normal, hemoglobin A1c 5.8%, PTH 72, vitamin D  45.6   Imaging: No results found.  Speciality Comments: No specialty comments available. 08/04/2016 CBC normal, CMP normal, UA negative, C3-C4 normal, DS DNA negative, ESR 4   Procedures:  No procedures performed Allergies: Patient has no known allergies.   Assessment / Plan:     Visit Diagnoses: Other systemic lupus erythematosus with other organ involvement (HCC) - Positive ANA, positive Smith, positive RNP, positive Ro, positive Raynauds, positive photosensitivity, positive arthritis,DLE -. She is clinically doing well for autoimmune labs have been stable. We will check her next set of labs in 4 months. Plan: Urinalysis, Routine w reflex microscopic, Sedimentation rate, Anti-DNA antibody, double-stranded, C3 and C4.  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  Hypopigmentation right knee: She has a small area of dermal hyperpigmentation from the cortisone injection.  High risk medication use - Plaquenil 200 mg by mouth every other day - Plan: CBC with Differential/Platelet, CBC with Differential/Platelet, COMPLETE METABOLIC PANEL WITH GFR her  most recent labs were normal. We will check CBC today.  Raynaud's disease without gangrene: She still continues to have some activity with exposure to cold temperatures.  Trochanteric bursitis of left hip: Doing better  Vitamin D deficiency: Vitamin D isn't desirable range now. She's been advised to continue with supplements.  Age-related osteoporosis without current pathological fracture - 05/23/2016 Tscore -3.3 [facility] , on Fosamax 70 mg by mouth every week(1/18). She's been tolerating Fosamax well I'll refill it today.  History of hypertension: Her blood pressure is well controlled.  History of IBS    Orders: Orders Placed This Encounter  Procedures  . CBC with Differential/Platelet  . CBC with Differential/Platelet  . COMPLETE METABOLIC PANEL WITH GFR  . Urinalysis, Routine w reflex microscopic  . Sedimentation rate  . Anti-DNA  antibody, double-stranded  . C3 and C4   Meds ordered this encounter  Medications  . alendronate (FOSAMAX) 70 MG tablet    Sig: TAKE 1 TABLET BY MOUTH ONCE A WEEK. TAKE WITH A FULL  GLASS OF WATER ON AN EMPTY  STOMACH.    Dispense:  12 tablet    Refill:  0    Face-to-face time spent with patient was 30 minutes. 50% of time was spent in counseling and coordination of care.  Follow-Up Instructions: Return in about 5 months (around 06/11/2017) for Systemic lupus.   Bo Merino, MD  Note - This record has been created using Editor, commissioning.  Chart creation errors have been sought, but may not always  have been located. Such creation errors do not reflect on  the standard of medical care.

## 2017-01-09 ENCOUNTER — Encounter: Payer: Self-pay | Admitting: Rheumatology

## 2017-01-09 ENCOUNTER — Ambulatory Visit (INDEPENDENT_AMBULATORY_CARE_PROVIDER_SITE_OTHER): Payer: Medicare Other | Admitting: Rheumatology

## 2017-01-09 ENCOUNTER — Telehealth: Payer: Self-pay | Admitting: Rheumatology

## 2017-01-09 ENCOUNTER — Other Ambulatory Visit: Payer: Self-pay | Admitting: Rheumatology

## 2017-01-09 VITALS — BP 113/67 | HR 79 | Resp 14 | Ht 64.5 in | Wt 143.0 lb

## 2017-01-09 DIAGNOSIS — M3219 Other organ or system involvement in systemic lupus erythematosus: Secondary | ICD-10-CM

## 2017-01-09 DIAGNOSIS — I7300 Raynaud's syndrome without gangrene: Secondary | ICD-10-CM | POA: Diagnosis not present

## 2017-01-09 DIAGNOSIS — M810 Age-related osteoporosis without current pathological fracture: Secondary | ICD-10-CM

## 2017-01-09 DIAGNOSIS — Z8679 Personal history of other diseases of the circulatory system: Secondary | ICD-10-CM | POA: Diagnosis not present

## 2017-01-09 DIAGNOSIS — E559 Vitamin D deficiency, unspecified: Secondary | ICD-10-CM | POA: Diagnosis not present

## 2017-01-09 DIAGNOSIS — L930 Discoid lupus erythematosus: Secondary | ICD-10-CM

## 2017-01-09 DIAGNOSIS — M7062 Trochanteric bursitis, left hip: Secondary | ICD-10-CM | POA: Diagnosis not present

## 2017-01-09 DIAGNOSIS — Z8719 Personal history of other diseases of the digestive system: Secondary | ICD-10-CM | POA: Diagnosis not present

## 2017-01-09 DIAGNOSIS — Z79899 Other long term (current) drug therapy: Secondary | ICD-10-CM

## 2017-01-09 LAB — CBC WITH DIFFERENTIAL/PLATELET
BASOS ABS: 56 {cells}/uL (ref 0–200)
Basophils Relative: 1 %
Eosinophils Absolute: 336 cells/uL (ref 15–500)
Eosinophils Relative: 6 %
HEMATOCRIT: 41.5 % (ref 35.0–45.0)
HEMOGLOBIN: 13.6 g/dL (ref 11.7–15.5)
Lymphocytes Relative: 25 %
Lymphs Abs: 1400 cells/uL (ref 850–3900)
MCH: 27.9 pg (ref 27.0–33.0)
MCHC: 32.8 g/dL (ref 32.0–36.0)
MCV: 85.2 fL (ref 80.0–100.0)
MONO ABS: 504 {cells}/uL (ref 200–950)
MPV: 9.5 fL (ref 7.5–12.5)
Monocytes Relative: 9 %
NEUTROS PCT: 59 %
Neutro Abs: 3304 cells/uL (ref 1500–7800)
Platelets: 294 10*3/uL (ref 140–400)
RBC: 4.87 MIL/uL (ref 3.80–5.10)
RDW: 14.1 % (ref 11.0–15.0)
WBC: 5.6 10*3/uL (ref 3.8–10.8)

## 2017-01-09 MED ORDER — ALENDRONATE SODIUM 70 MG PO TABS: ORAL_TABLET | ORAL | 1 refills | Status: DC

## 2017-01-09 MED ORDER — ALENDRONATE SODIUM 70 MG PO TABS: ORAL_TABLET | ORAL | 0 refills | Status: DC

## 2017-01-09 NOTE — Telephone Encounter (Signed)
ok 

## 2017-01-09 NOTE — Telephone Encounter (Signed)
Last Visit: 01/09/17 Next visit: 06/17/17 Labs: 12/24/2016 CMP normal, hemoglobin A1c 5.8%, PTH 72, vitamin D 45.6  Okay to refill Fosamax?

## 2017-01-09 NOTE — Progress Notes (Signed)
WNL

## 2017-01-09 NOTE — Telephone Encounter (Signed)
Patient called stating that her Fosamax needs to go to [facility] and not [facility] and also it had no refills indicated.  She stated that it usually has enough refills to last 5 months until she sees Dr. Estanislado Pandy again.  CB#[phone].  Thank you

## 2017-01-09 NOTE — Telephone Encounter (Signed)
Okay to refill per Dr. Estanislado Pandy. Prescription has been sent to [facility].

## 2017-03-02 ENCOUNTER — Other Ambulatory Visit: Payer: Self-pay | Admitting: Rheumatology

## 2017-03-02 DIAGNOSIS — M810 Age-related osteoporosis without current pathological fracture: Secondary | ICD-10-CM | POA: Diagnosis not present

## 2017-03-02 DIAGNOSIS — I10 Essential (primary) hypertension: Secondary | ICD-10-CM | POA: Diagnosis not present

## 2017-03-02 DIAGNOSIS — E559 Vitamin D deficiency, unspecified: Secondary | ICD-10-CM | POA: Diagnosis not present

## 2017-03-02 DIAGNOSIS — E213 Hyperparathyroidism, unspecified: Secondary | ICD-10-CM | POA: Diagnosis not present

## 2017-03-02 NOTE — Telephone Encounter (Signed)
Last Visit: 01/09/17 Next visit: 06/17/17 Labs: 12/24/2016 CMP normal Normal PLQ Eye exam 06/03/2016   Okay to refill per Dr. Estanislado Pandy

## 2017-03-11 DIAGNOSIS — Z85828 Personal history of other malignant neoplasm of skin: Secondary | ICD-10-CM | POA: Diagnosis not present

## 2017-03-11 DIAGNOSIS — L819 Disorder of pigmentation, unspecified: Secondary | ICD-10-CM | POA: Diagnosis not present

## 2017-03-11 DIAGNOSIS — D225 Melanocytic nevi of trunk: Secondary | ICD-10-CM | POA: Diagnosis not present

## 2017-04-04 ENCOUNTER — Other Ambulatory Visit: Payer: Self-pay | Admitting: Rheumatology

## 2017-04-06 DIAGNOSIS — M25532 Pain in left wrist: Secondary | ICD-10-CM | POA: Diagnosis not present

## 2017-04-06 DIAGNOSIS — M778 Other enthesopathies, not elsewhere classified: Secondary | ICD-10-CM | POA: Diagnosis not present

## 2017-04-06 NOTE — Telephone Encounter (Signed)
Last Visit: 01/09/17 Next visit: 06/17/17 Labs: 12/24/2016 WNL  Okay to refill per Dr. Patsey Berthold

## 2017-04-07 DIAGNOSIS — M778 Other enthesopathies, not elsewhere classified: Secondary | ICD-10-CM | POA: Diagnosis not present

## 2017-04-09 DIAGNOSIS — M25532 Pain in left wrist: Secondary | ICD-10-CM | POA: Diagnosis not present

## 2017-04-09 DIAGNOSIS — M654 Radial styloid tenosynovitis [de Quervain]: Secondary | ICD-10-CM | POA: Diagnosis not present

## 2017-04-14 DIAGNOSIS — M654 Radial styloid tenosynovitis [de Quervain]: Secondary | ICD-10-CM | POA: Diagnosis not present

## 2017-04-14 DIAGNOSIS — M25532 Pain in left wrist: Secondary | ICD-10-CM | POA: Diagnosis not present

## 2017-04-15 DIAGNOSIS — M25532 Pain in left wrist: Secondary | ICD-10-CM | POA: Diagnosis not present

## 2017-04-15 DIAGNOSIS — M654 Radial styloid tenosynovitis [de Quervain]: Secondary | ICD-10-CM | POA: Diagnosis not present

## 2017-04-20 DIAGNOSIS — M25532 Pain in left wrist: Secondary | ICD-10-CM | POA: Diagnosis not present

## 2017-04-20 DIAGNOSIS — M654 Radial styloid tenosynovitis [de Quervain]: Secondary | ICD-10-CM | POA: Diagnosis not present

## 2017-04-22 DIAGNOSIS — M25532 Pain in left wrist: Secondary | ICD-10-CM | POA: Diagnosis not present

## 2017-04-22 DIAGNOSIS — M654 Radial styloid tenosynovitis [de Quervain]: Secondary | ICD-10-CM | POA: Diagnosis not present

## 2017-04-25 DIAGNOSIS — Z23 Encounter for immunization: Secondary | ICD-10-CM | POA: Diagnosis not present

## 2017-04-27 DIAGNOSIS — M25532 Pain in left wrist: Secondary | ICD-10-CM | POA: Diagnosis not present

## 2017-04-27 DIAGNOSIS — M654 Radial styloid tenosynovitis [de Quervain]: Secondary | ICD-10-CM | POA: Diagnosis not present

## 2017-04-29 DIAGNOSIS — M25532 Pain in left wrist: Secondary | ICD-10-CM | POA: Diagnosis not present

## 2017-04-29 DIAGNOSIS — M654 Radial styloid tenosynovitis [de Quervain]: Secondary | ICD-10-CM | POA: Diagnosis not present

## 2017-05-04 DIAGNOSIS — M25532 Pain in left wrist: Secondary | ICD-10-CM | POA: Diagnosis not present

## 2017-05-04 DIAGNOSIS — M654 Radial styloid tenosynovitis [de Quervain]: Secondary | ICD-10-CM | POA: Diagnosis not present

## 2017-05-07 DIAGNOSIS — M25532 Pain in left wrist: Secondary | ICD-10-CM | POA: Diagnosis not present

## 2017-05-07 DIAGNOSIS — M654 Radial styloid tenosynovitis [de Quervain]: Secondary | ICD-10-CM | POA: Diagnosis not present

## 2017-05-18 DIAGNOSIS — M25532 Pain in left wrist: Secondary | ICD-10-CM | POA: Diagnosis not present

## 2017-05-18 DIAGNOSIS — M654 Radial styloid tenosynovitis [de Quervain]: Secondary | ICD-10-CM | POA: Diagnosis not present

## 2017-05-25 ENCOUNTER — Other Ambulatory Visit: Payer: Self-pay

## 2017-05-25 DIAGNOSIS — Z803 Family history of malignant neoplasm of breast: Secondary | ICD-10-CM | POA: Diagnosis not present

## 2017-05-25 DIAGNOSIS — M3219 Other organ or system involvement in systemic lupus erythematosus: Secondary | ICD-10-CM | POA: Diagnosis not present

## 2017-05-25 DIAGNOSIS — Z79899 Other long term (current) drug therapy: Secondary | ICD-10-CM

## 2017-05-25 DIAGNOSIS — Z1231 Encounter for screening mammogram for malignant neoplasm of breast: Secondary | ICD-10-CM | POA: Diagnosis not present

## 2017-05-26 LAB — CBC WITH DIFFERENTIAL/PLATELET
BASOS ABS: 68 {cells}/uL (ref 0–200)
Basophils Relative: 0.9 %
EOS ABS: 375 {cells}/uL (ref 15–500)
EOS PCT: 5 %
HEMATOCRIT: 38 % (ref 35.0–45.0)
Hemoglobin: 12.8 g/dL (ref 11.7–15.5)
LYMPHS ABS: 1343 {cells}/uL (ref 850–3900)
MCH: 28.4 pg (ref 27.0–33.0)
MCHC: 33.7 g/dL (ref 32.0–36.0)
MCV: 84.3 fL (ref 80.0–100.0)
MONOS PCT: 7.2 %
MPV: 10.8 fL (ref 7.5–12.5)
NEUTROS PCT: 69 %
Neutro Abs: 5175 cells/uL (ref 1500–7800)
Platelets: 277 10*3/uL (ref 140–400)
RBC: 4.51 10*6/uL (ref 3.80–5.10)
RDW: 12.8 % (ref 11.0–15.0)
Total Lymphocyte: 17.9 %
WBC mixed population: 540 cells/uL (ref 200–950)
WBC: 7.5 10*3/uL (ref 3.8–10.8)

## 2017-05-26 LAB — URINALYSIS, ROUTINE W REFLEX MICROSCOPIC
BACTERIA UA: NONE SEEN /HPF
Bilirubin Urine: NEGATIVE
GLUCOSE, UA: NEGATIVE
HYALINE CAST: NONE SEEN /LPF
Hgb urine dipstick: NEGATIVE
Ketones, ur: NEGATIVE
Nitrite: NEGATIVE
PH: 6.5 (ref 5.0–8.0)
Protein, ur: NEGATIVE
RBC / HPF: NONE SEEN /HPF (ref 0–2)
SPECIFIC GRAVITY, URINE: 1.015 (ref 1.001–1.03)
Squamous Epithelial / LPF: NONE SEEN /HPF (ref <=5)

## 2017-05-26 LAB — COMPLETE METABOLIC PANEL WITH GFR
AG Ratio: 1.6 (calc) (ref 1.0–2.5)
ALBUMIN MSPROF: 4.2 g/dL (ref 3.6–5.1)
ALKALINE PHOSPHATASE (APISO): 51 U/L (ref 33–130)
ALT: 15 U/L (ref 6–29)
AST: 10 U/L (ref 10–35)
BILIRUBIN TOTAL: 0.5 mg/dL (ref 0.2–1.2)
BUN: 16 mg/dL (ref 7–25)
CHLORIDE: 106 mmol/L (ref 98–110)
CO2: 30 mmol/L (ref 20–32)
Calcium: 9.7 mg/dL (ref 8.6–10.4)
Creat: 0.88 mg/dL (ref 0.50–0.99)
GFR, Est African American: 79 mL/min/{1.73_m2} (ref >=60)
GFR, Est Non African American: 68 mL/min/{1.73_m2} (ref >=60)
GLUCOSE: 88 mg/dL (ref 65–99)
Globulin: 2.7 g/dL (calc) (ref 1.9–3.7)
Potassium: 4 mmol/L (ref 3.5–5.3)
SODIUM: 143 mmol/L (ref 135–146)
Total Protein: 6.9 g/dL (ref 6.1–8.1)

## 2017-05-26 LAB — C3 AND C4
C3 Complement: 122 mg/dL (ref 83–193)
C4 COMPLEMENT: 26 mg/dL (ref 15–57)

## 2017-05-26 LAB — ANTI-DNA ANTIBODY, DOUBLE-STRANDED

## 2017-05-26 LAB — SEDIMENTATION RATE: Sed Rate: 6 mm/h (ref 0–30)

## 2017-05-26 NOTE — Progress Notes (Signed)
Labs are stable. No change in treatment

## 2017-05-27 DIAGNOSIS — M25532 Pain in left wrist: Secondary | ICD-10-CM | POA: Diagnosis not present

## 2017-05-27 DIAGNOSIS — M654 Radial styloid tenosynovitis [de Quervain]: Secondary | ICD-10-CM | POA: Diagnosis not present

## 2017-06-01 DIAGNOSIS — M25532 Pain in left wrist: Secondary | ICD-10-CM | POA: Diagnosis not present

## 2017-06-01 DIAGNOSIS — M654 Radial styloid tenosynovitis [de Quervain]: Secondary | ICD-10-CM | POA: Diagnosis not present

## 2017-06-08 DIAGNOSIS — M654 Radial styloid tenosynovitis [de Quervain]: Secondary | ICD-10-CM | POA: Diagnosis not present

## 2017-06-08 DIAGNOSIS — M25532 Pain in left wrist: Secondary | ICD-10-CM | POA: Diagnosis not present

## 2017-06-08 NOTE — Progress Notes (Deleted)
Office Visit Note  Patient: Theresa Graham             Date of Birth: May 06, 1951           MRN: [id number]             PCP: Carol Ada, MD Referring: Carol Ada, MD Visit Date: 06/17/2017 Occupation: @GUAROCC @    Subjective:  No chief complaint on file.   History of Present Illness: Theresa Graham is a 66 y.o. female ***   Activities of Daily Living:  Patient reports morning stiffness for *** {minute/hour:19697}.   Patient {ACTIONS;DENIES/REPORTS:21021675::"Denies"} nocturnal pain.  Difficulty dressing/grooming: {ACTIONS;DENIES/REPORTS:21021675::"Denies"} Difficulty climbing stairs: {ACTIONS;DENIES/REPORTS:21021675::"Denies"} Difficulty getting out of chair: {ACTIONS;DENIES/REPORTS:21021675::"Denies"} Difficulty using hands for taps, buttons, cutlery, and/or writing: {ACTIONS;DENIES/REPORTS:21021675::"Denies"}   No Rheumatology ROS completed.   PMFS History:  Patient Active Problem List   Diagnosis Date Noted  . Trochanteric bursitis of left hip 01/08/2017  . 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 Age-related osteoporosis without current pathological fracture 08/04/2016  . Systemic lupus erythematosus ([place]) 08/01/2016  . Vitamin D deficiency 08/01/2016  . History of IBS 08/01/2016  . Plantar fasciitis 08/01/2016  . Raynaud's disease without gangrene 08/01/2016  . History of hypertension 08/01/2016  . High risk medication use 06/18/2016  . Hypertension   . HSV (herpes simplex virus) infection   . CHYIFOYD(741.2)     Past Medical History:  Diagnosis Date  . HSV (herpes simplex virus) infection   . Hypertension   . Lupus    sle and discoid lupus  . Osteoporosis 2017   T score -3.3 at [facility]    Family History  Problem Relation Age of Onset  . Diabetes Mother   . Hypertension Mother   . Thyroid disease Mother   . Heart disease Mother   . Breast cancer Mother        Age 54  . Diabetes Father   . Hypertension Father   . Cancer Father        COLON  . Heart  disease Father   . Diabetes Sister   . Crohn's disease Sister   . Hypertension Sister   . Stroke Sister   . Hypertension Brother   . Colitis Sister   . Diabetes Maternal Grandmother   . Diabetes Paternal Grandmother   . Diabetes Paternal Grandfather    Past Surgical History:  Procedure Laterality Date  . ABDOMINAL HYSTERECTOMY  1987   TAH  . FOOT SURGERY    . ROTATOR CUFF REPAIR  2006  . SHOULDER SURGERY FOR BONE SPUR  2010  . STRESS FRACTURE LEG  08/2007  . TUBAL LIGATION  1978   Social History   Social History Narrative  . Not on file     Objective: Vital Signs: There were no vitals taken for this visit.   Physical Exam   Musculoskeletal Exam: ***  CDAI Exam: No CDAI exam completed.    Investigation: No additional findings.PLQ eye exam: 06/03/2016 CBC Latest Ref Rng & Units 05/25/2017 01/09/2017 08/04/2016  WBC 3.8 - 10.8 Thousand/uL 7.5 5.6 4.9  Hemoglobin 11.7 - 15.5 g/dL 12.8 13.6 12.9  Hematocrit 35.0 - 45.0 % 38.0 41.5 39.7  Platelets 140 - 400 Thousand/uL 277 294 299   CMP Latest Ref Rng & Units 05/25/2017 08/04/2016 09/29/2008  Glucose 65 - 99 mg/dL 88 105(H) 86  BUN 7 - 25 mg/dL 16 17 11   Creatinine 0.50 - 0.99 mg/dL 0.88 0.87 0.94  Sodium 135 - 146 mmol/L 143 139  138  Potassium 3.5 - 5.3 mmol/L 4.0 3.7 4.1  Chloride 98 - 110 mmol/L 106 103 103  CO2 20 - 32 mmol/L 30 24 28   Calcium 8.6 - 10.4 mg/dL 9.7 9.6 9.3  Total Protein 6.1 - 8.1 g/dL 6.9 6.9 -  Total Bilirubin 0.2 - 1.2 mg/dL 0.5 0.5 -  Alkaline Phos 33 - 130 U/L - 69 -  AST 10 - 35 U/L 10 12 -  ALT 6 - 29 U/L 15 17 -    Imaging: No results found.  Speciality Comments: No specialty comments available.    Procedures:  No procedures performed Allergies: Patient has no known allergies.   Assessment / Plan:     Visit Diagnoses: No diagnosis found.    Orders: No orders of the defined types were placed in this encounter.  No orders of the defined types were placed in this  encounter.   Face-to-face time spent with patient was *** minutes. 50% of time was spent in counseling and coordination of care.  Follow-Up Instructions: No Follow-up on file.   Earnestine Mealing, CMA  Note - This record has been created using Editor, commissioning.  Chart creation errors have been sought, but may not always  have been located. Such creation errors do not reflect on  the standard of medical care.

## 2017-06-15 ENCOUNTER — Telehealth (INDEPENDENT_AMBULATORY_CARE_PROVIDER_SITE_OTHER): Payer: Self-pay

## 2017-06-15 DIAGNOSIS — M654 Radial styloid tenosynovitis [de Quervain]: Secondary | ICD-10-CM | POA: Diagnosis not present

## 2017-06-15 DIAGNOSIS — M25532 Pain in left wrist: Secondary | ICD-10-CM | POA: Diagnosis not present

## 2017-06-15 NOTE — Telephone Encounter (Signed)
FYI- Patient wanted to let Dr. Estanislado Pandy know that she had to cancel due to an emergency and had to go out of town.

## 2017-06-17 ENCOUNTER — Ambulatory Visit: Payer: Medicare Other | Admitting: Rheumatology

## 2017-06-17 DIAGNOSIS — I10 Essential (primary) hypertension: Secondary | ICD-10-CM | POA: Diagnosis not present

## 2017-06-17 DIAGNOSIS — Z1389 Encounter for screening for other disorder: Secondary | ICD-10-CM | POA: Diagnosis not present

## 2017-06-17 DIAGNOSIS — Z0000 Encounter for general adult medical examination without abnormal findings: Secondary | ICD-10-CM | POA: Diagnosis not present

## 2017-06-17 DIAGNOSIS — M329 Systemic lupus erythematosus, unspecified: Secondary | ICD-10-CM | POA: Diagnosis not present

## 2017-06-17 DIAGNOSIS — Z23 Encounter for immunization: Secondary | ICD-10-CM | POA: Diagnosis not present

## 2017-06-17 DIAGNOSIS — K589 Irritable bowel syndrome without diarrhea: Secondary | ICD-10-CM | POA: Diagnosis not present

## 2017-06-17 DIAGNOSIS — M654 Radial styloid tenosynovitis [de Quervain]: Secondary | ICD-10-CM | POA: Diagnosis not present

## 2017-06-17 DIAGNOSIS — R7303 Prediabetes: Secondary | ICD-10-CM | POA: Diagnosis not present

## 2017-06-24 DIAGNOSIS — M3210 Systemic lupus erythematosus, organ or system involvement unspecified: Secondary | ICD-10-CM | POA: Diagnosis not present

## 2017-06-24 DIAGNOSIS — Z79899 Other long term (current) drug therapy: Secondary | ICD-10-CM | POA: Diagnosis not present

## 2017-06-27 ENCOUNTER — Other Ambulatory Visit: Payer: Self-pay | Admitting: Rheumatology

## 2017-07-01 NOTE — Telephone Encounter (Signed)
Last Visit: 01/09/17 Next visit: 06/17/17 Labs: 05/25/17 stable  Okay to refill per Dr. Estanislado Pandy

## 2017-07-06 DIAGNOSIS — M654 Radial styloid tenosynovitis [de Quervain]: Secondary | ICD-10-CM | POA: Diagnosis not present

## 2017-07-06 DIAGNOSIS — M25532 Pain in left wrist: Secondary | ICD-10-CM | POA: Diagnosis not present

## 2017-07-08 ENCOUNTER — Encounter: Payer: Self-pay | Admitting: Gynecology

## 2017-07-08 ENCOUNTER — Ambulatory Visit (INDEPENDENT_AMBULATORY_CARE_PROVIDER_SITE_OTHER): Payer: Medicare Other | Admitting: Gynecology

## 2017-07-08 VITALS — BP 118/74 | Ht 64.5 in | Wt 150.0 lb

## 2017-07-08 DIAGNOSIS — Z01411 Encounter for gynecological examination (general) (routine) with abnormal findings: Secondary | ICD-10-CM | POA: Diagnosis not present

## 2017-07-08 DIAGNOSIS — Z779 Other contact with and (suspected) exposures hazardous to health: Secondary | ICD-10-CM | POA: Diagnosis not present

## 2017-07-08 DIAGNOSIS — N952 Postmenopausal atrophic vaginitis: Secondary | ICD-10-CM

## 2017-07-08 DIAGNOSIS — Z9189 Other specified personal risk factors, not elsewhere classified: Secondary | ICD-10-CM

## 2017-07-08 DIAGNOSIS — M810 Age-related osteoporosis without current pathological fracture: Secondary | ICD-10-CM

## 2017-07-08 NOTE — Progress Notes (Signed)
    Theresa Graham 26-Apr-1951 [id number]        66 y.o.  G1P1001 for breast and pelvic exam.  Doing well without complaints  Past medical history,surgical history, problem list, medications, allergies, family history and social history were all reviewed and documented as reviewed in the EPIC chart.  ROS:  Performed with pertinent positives and negatives included in the history, assessment and plan.   Additional significant findings : None   Exam: Caryn Bee assistant Vitals:   07/08/17 1406  BP: 118/74  Weight: 150 lb (68 kg)  Height: 5' 4.5" (1.638 m)   Body mass index is 25.35 kg/m.  General appearance:  Normal affect, orientation and appearance. Skin: Grossly normal HEENT: Without gross lesions.  No cervical or supraclavicular adenopathy. Thyroid normal.  Lungs:  Clear without wheezing, rales or rhonchi Cardiac: RR, without RMG Abdominal:  Soft, nontender, without masses, guarding, rebound, organomegaly or hernia Breasts:  Examined lying and sitting without masses, retractions, discharge or axillary adenopathy. Pelvic:  Ext, BUS, Vagina: With atrophic changes  Adnexa: Without masses or tenderness    Anus and perineum: Normal   Rectovaginal: Normal sphincter tone without palpated masses or tenderness.    Assessment/Plan:  66 y.o. G53P1001 female for breast and pelvic exam.   1. Postmenopausal/atrophic genital changes.  Status post TAH for leiomyoma.  No significant hot flushes, night sweats or vaginal dryness. 2. History of HSV.  Has not had an outbreak in a long time.  Does not need her acyclovir reordered.  Will call if she needs more. 3. Osteoporosis.  DEXA 05/2016 T score -3.3.  Reinitiated Fosamax last year through Dr. Clementeen Graham office.  History of prior use for approximately 6 years.  She asked me about the issues of prolonged use and we discussed possible increased risk of atypical fractures.  At this point she is going to continue on this.  She does obtain a through  Dr. Clementeen Graham office but asked if I could refill it this coming year for convenience.  Will call when she needs more.  She also had a marginally elevated PTH in the 70 range.  Ultimately saw Dr. Chalmers Cater this year and she is going to see her again in February.  She thinks this is due to excessive calcium intake and not an issue with an adenoma.  Patient will follow-up with Dr. Chalmers Cater in reference to this.  Plan repeat DEXA next year at 2-year interval. 4. Pap smear 06/2015.  No Pap smear done today.  No history of significant abnormal Pap smears.  Plan follow-up Pap smear next year at 3-year interval. 5. Mammography 05/2017.  Continue with annual mammography next year.  Breast exam normal today.  SBE monthly reviewed. 6. Colonoscopy 2016.  Repeat at their recommended interval. 7. Health maintenance.  No routine lab work done as patient does this elsewhere.  Follow-up 1 year, sooner as needed.   Anastasio Auerbach MD, 2:38 PM 07/08/2017

## 2017-07-08 NOTE — Patient Instructions (Signed)
Follow-up in 1 year for annual exam, sooner as needed. 

## 2017-08-17 DIAGNOSIS — R635 Abnormal weight gain: Secondary | ICD-10-CM | POA: Diagnosis not present

## 2017-08-17 DIAGNOSIS — N951 Menopausal and female climacteric states: Secondary | ICD-10-CM | POA: Diagnosis not present

## 2017-08-18 DIAGNOSIS — M2550 Pain in unspecified joint: Secondary | ICD-10-CM | POA: Diagnosis not present

## 2017-08-18 DIAGNOSIS — I10 Essential (primary) hypertension: Secondary | ICD-10-CM | POA: Diagnosis not present

## 2017-08-18 DIAGNOSIS — N951 Menopausal and female climacteric states: Secondary | ICD-10-CM | POA: Diagnosis not present

## 2017-08-18 DIAGNOSIS — E663 Overweight: Secondary | ICD-10-CM | POA: Diagnosis not present

## 2017-08-18 DIAGNOSIS — G479 Sleep disorder, unspecified: Secondary | ICD-10-CM | POA: Diagnosis not present

## 2017-08-19 DIAGNOSIS — I10 Essential (primary) hypertension: Secondary | ICD-10-CM | POA: Diagnosis not present

## 2017-08-19 DIAGNOSIS — E663 Overweight: Secondary | ICD-10-CM | POA: Diagnosis not present

## 2017-08-26 DIAGNOSIS — I10 Essential (primary) hypertension: Secondary | ICD-10-CM | POA: Diagnosis not present

## 2017-08-26 DIAGNOSIS — Z87898 Personal history of other specified conditions: Secondary | ICD-10-CM | POA: Diagnosis not present

## 2017-08-31 DIAGNOSIS — M654 Radial styloid tenosynovitis [de Quervain]: Secondary | ICD-10-CM | POA: Diagnosis not present

## 2017-09-02 DIAGNOSIS — E663 Overweight: Secondary | ICD-10-CM | POA: Diagnosis not present

## 2017-09-02 DIAGNOSIS — I10 Essential (primary) hypertension: Secondary | ICD-10-CM | POA: Diagnosis not present

## 2017-09-02 DIAGNOSIS — E213 Hyperparathyroidism, unspecified: Secondary | ICD-10-CM | POA: Diagnosis not present

## 2017-09-04 DIAGNOSIS — M25532 Pain in left wrist: Secondary | ICD-10-CM | POA: Diagnosis not present

## 2017-09-04 DIAGNOSIS — M654 Radial styloid tenosynovitis [de Quervain]: Secondary | ICD-10-CM | POA: Diagnosis not present

## 2017-09-09 DIAGNOSIS — Z87898 Personal history of other specified conditions: Secondary | ICD-10-CM | POA: Diagnosis not present

## 2017-09-09 DIAGNOSIS — Z713 Dietary counseling and surveillance: Secondary | ICD-10-CM | POA: Diagnosis not present

## 2017-09-09 DIAGNOSIS — I10 Essential (primary) hypertension: Secondary | ICD-10-CM | POA: Diagnosis not present

## 2017-09-10 DIAGNOSIS — M654 Radial styloid tenosynovitis [de Quervain]: Secondary | ICD-10-CM | POA: Diagnosis not present

## 2017-09-10 DIAGNOSIS — M25532 Pain in left wrist: Secondary | ICD-10-CM | POA: Diagnosis not present

## 2017-09-14 DIAGNOSIS — M25532 Pain in left wrist: Secondary | ICD-10-CM | POA: Diagnosis not present

## 2017-09-14 DIAGNOSIS — M654 Radial styloid tenosynovitis [de Quervain]: Secondary | ICD-10-CM | POA: Diagnosis not present

## 2017-09-16 DIAGNOSIS — M654 Radial styloid tenosynovitis [de Quervain]: Secondary | ICD-10-CM | POA: Diagnosis not present

## 2017-09-16 DIAGNOSIS — Z87898 Personal history of other specified conditions: Secondary | ICD-10-CM | POA: Diagnosis not present

## 2017-09-16 DIAGNOSIS — Z713 Dietary counseling and surveillance: Secondary | ICD-10-CM | POA: Diagnosis not present

## 2017-09-16 DIAGNOSIS — M25532 Pain in left wrist: Secondary | ICD-10-CM | POA: Diagnosis not present

## 2017-09-16 DIAGNOSIS — I10 Essential (primary) hypertension: Secondary | ICD-10-CM | POA: Diagnosis not present

## 2017-09-20 ENCOUNTER — Other Ambulatory Visit: Payer: Self-pay | Admitting: Rheumatology

## 2017-09-21 NOTE — Telephone Encounter (Signed)
Last Visit: 01/09/17 Next visit: 06/17/17 Labs: 05/25/17 stable  Okay to refill per Dr. Estanislado Pandy

## 2017-09-22 DIAGNOSIS — M25532 Pain in left wrist: Secondary | ICD-10-CM | POA: Diagnosis not present

## 2017-09-22 DIAGNOSIS — M654 Radial styloid tenosynovitis [de Quervain]: Secondary | ICD-10-CM | POA: Diagnosis not present

## 2017-09-23 DIAGNOSIS — I10 Essential (primary) hypertension: Secondary | ICD-10-CM | POA: Diagnosis not present

## 2017-09-23 DIAGNOSIS — Z87898 Personal history of other specified conditions: Secondary | ICD-10-CM | POA: Diagnosis not present

## 2017-09-23 DIAGNOSIS — Z713 Dietary counseling and surveillance: Secondary | ICD-10-CM | POA: Diagnosis not present

## 2017-09-25 DIAGNOSIS — M25532 Pain in left wrist: Secondary | ICD-10-CM | POA: Diagnosis not present

## 2017-09-25 DIAGNOSIS — M654 Radial styloid tenosynovitis [de Quervain]: Secondary | ICD-10-CM | POA: Diagnosis not present

## 2017-09-28 DIAGNOSIS — Z713 Dietary counseling and surveillance: Secondary | ICD-10-CM | POA: Diagnosis not present

## 2017-09-28 DIAGNOSIS — I10 Essential (primary) hypertension: Secondary | ICD-10-CM | POA: Diagnosis not present

## 2017-09-28 DIAGNOSIS — Z87898 Personal history of other specified conditions: Secondary | ICD-10-CM | POA: Diagnosis not present

## 2017-10-05 DIAGNOSIS — M654 Radial styloid tenosynovitis [de Quervain]: Secondary | ICD-10-CM | POA: Diagnosis not present

## 2017-10-05 DIAGNOSIS — M25532 Pain in left wrist: Secondary | ICD-10-CM | POA: Diagnosis not present

## 2017-10-07 DIAGNOSIS — M654 Radial styloid tenosynovitis [de Quervain]: Secondary | ICD-10-CM | POA: Diagnosis not present

## 2017-10-12 DIAGNOSIS — Z87898 Personal history of other specified conditions: Secondary | ICD-10-CM | POA: Diagnosis not present

## 2017-10-12 DIAGNOSIS — Z713 Dietary counseling and surveillance: Secondary | ICD-10-CM | POA: Diagnosis not present

## 2017-10-12 DIAGNOSIS — I10 Essential (primary) hypertension: Secondary | ICD-10-CM | POA: Diagnosis not present

## 2017-10-12 DIAGNOSIS — M25532 Pain in left wrist: Secondary | ICD-10-CM | POA: Diagnosis not present

## 2017-10-16 DIAGNOSIS — M25532 Pain in left wrist: Secondary | ICD-10-CM | POA: Diagnosis not present

## 2017-10-21 DIAGNOSIS — I10 Essential (primary) hypertension: Secondary | ICD-10-CM | POA: Diagnosis not present

## 2017-10-21 DIAGNOSIS — Z713 Dietary counseling and surveillance: Secondary | ICD-10-CM | POA: Diagnosis not present

## 2017-10-21 DIAGNOSIS — Z87898 Personal history of other specified conditions: Secondary | ICD-10-CM | POA: Diagnosis not present

## 2017-10-21 DIAGNOSIS — M654 Radial styloid tenosynovitis [de Quervain]: Secondary | ICD-10-CM | POA: Diagnosis not present

## 2017-10-21 DIAGNOSIS — M25532 Pain in left wrist: Secondary | ICD-10-CM | POA: Diagnosis not present

## 2017-10-26 DIAGNOSIS — M654 Radial styloid tenosynovitis [de Quervain]: Secondary | ICD-10-CM | POA: Diagnosis not present

## 2017-10-26 DIAGNOSIS — M25532 Pain in left wrist: Secondary | ICD-10-CM | POA: Diagnosis not present

## 2017-10-28 DIAGNOSIS — M654 Radial styloid tenosynovitis [de Quervain]: Secondary | ICD-10-CM | POA: Diagnosis not present

## 2017-10-28 DIAGNOSIS — I10 Essential (primary) hypertension: Secondary | ICD-10-CM | POA: Diagnosis not present

## 2017-10-28 DIAGNOSIS — Z713 Dietary counseling and surveillance: Secondary | ICD-10-CM | POA: Diagnosis not present

## 2017-10-28 DIAGNOSIS — M25532 Pain in left wrist: Secondary | ICD-10-CM | POA: Diagnosis not present

## 2017-10-28 DIAGNOSIS — Z87898 Personal history of other specified conditions: Secondary | ICD-10-CM | POA: Diagnosis not present

## 2017-11-11 ENCOUNTER — Ambulatory Visit: Payer: Medicare Other | Admitting: Rheumatology

## 2017-11-11 DIAGNOSIS — Z713 Dietary counseling and surveillance: Secondary | ICD-10-CM | POA: Diagnosis not present

## 2017-11-11 DIAGNOSIS — Z87898 Personal history of other specified conditions: Secondary | ICD-10-CM | POA: Diagnosis not present

## 2017-11-11 DIAGNOSIS — I10 Essential (primary) hypertension: Secondary | ICD-10-CM | POA: Diagnosis not present

## 2017-11-11 DIAGNOSIS — Z6823 Body mass index (BMI) 23.0-23.9, adult: Secondary | ICD-10-CM | POA: Diagnosis not present

## 2017-11-13 DIAGNOSIS — M654 Radial styloid tenosynovitis [de Quervain]: Secondary | ICD-10-CM | POA: Diagnosis not present

## 2017-11-13 DIAGNOSIS — M25532 Pain in left wrist: Secondary | ICD-10-CM | POA: Diagnosis not present

## 2017-11-16 DIAGNOSIS — M25532 Pain in left wrist: Secondary | ICD-10-CM | POA: Diagnosis not present

## 2017-11-16 DIAGNOSIS — M654 Radial styloid tenosynovitis [de Quervain]: Secondary | ICD-10-CM | POA: Diagnosis not present

## 2017-11-18 DIAGNOSIS — Z6823 Body mass index (BMI) 23.0-23.9, adult: Secondary | ICD-10-CM | POA: Diagnosis not present

## 2017-11-18 DIAGNOSIS — I10 Essential (primary) hypertension: Secondary | ICD-10-CM | POA: Diagnosis not present

## 2017-11-18 DIAGNOSIS — Z713 Dietary counseling and surveillance: Secondary | ICD-10-CM | POA: Diagnosis not present

## 2017-11-19 NOTE — Progress Notes (Signed)
Office Visit Note  Patient: Theresa Graham             Date of Birth: [birth year]           MRN: [id number]             PCP: Carol Ada, MD Referring: Carol Ada, MD Visit Date: 12/03/2017 Occupation: @GUAROCC @    Subjective:  Fatigue    History of Present Illness: Theresa Graham is a 67 y.o. female with history of systemic lupus erythematosus, discoid lupus, Raynauds, and osteoporosis.  Patient states she continues to take 1 tablet every other day of Plaquenil.  She needs a refill phlegmonous today.  She denies any recent flares of lupus.  She continues to have photosensitivity and wears sunscreen a regular basis.  She states she recently went to [place] and developed a rash on her shoulders and upper back.  Ports that she has been using triamcinolone cream which is resolving her rash.  She denies any sores in her mouth or nose.  She denies any swollen lymph nodes.  She denies sicca symptoms.  She continues to have Raynaud's in her fingers.  She denies any digital ulcerations.  She denies any recent fevers, shortness of breath, palpitations.  She continues to have fatigue but relates it to insomnia.  She states that her trochanteric bursitis bilaterally has improved.  She states she will have some discomfort in her right knee at times and is noticed some swelling.  She has been using Voltaren gel which has been helping with her pain. 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 Rhona Raider.  She states that she has tried 3 cortisone injections in the past as well as 3 rounds of physical therapy without improvement.     Activities of Daily Living:  Patient reports morning stiffness for 10 minutes.   Patient Denies nocturnal pain.  Difficulty dressing/grooming: Denies Difficulty climbing stairs: Denies Difficulty getting out of chair: Denies Difficulty using hands for taps, buttons, cutlery, and/or writing: Denies   Review of  Systems  Constitutional: Positive for fatigue.  HENT: Negative for mouth sores, mouth dryness and nose dryness.   Eyes: Negative for pain, visual disturbance and dryness.  Respiratory: Negative for cough, hemoptysis, shortness of breath and difficulty breathing.   Cardiovascular: Negative for chest pain, palpitations, hypertension and swelling in legs/feet.  Gastrointestinal: Negative for blood in stool, constipation and diarrhea.  Endocrine: Negative for increased urination.  Genitourinary: Negative for painful urination.  Musculoskeletal: Positive for arthralgias, joint pain, joint swelling and morning stiffness. Negative for myalgias, muscle weakness, muscle tenderness and myalgias.  Skin: Positive for color change, rash and sensitivity to sunlight. Negative for pallor, hair loss, nodules/bumps, skin tightness and ulcers.  Allergic/Immunologic: Negative for susceptible to infections.  Neurological: Negative for dizziness, numbness, headaches and weakness.  Hematological: Negative for swollen glands.  Psychiatric/Behavioral: Positive for sleep disturbance. Negative for depressed mood. The patient is not nervous/anxious.     PMFS History:  Patient Active Problem List   Diagnosis Date Noted  . Trochanteric bursitis of left hip 01/08/2017  . 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 Age-related osteoporosis without current pathological fracture 08/04/2016  . Systemic lupus erythematosus ([place]) 08/01/2016  . Vitamin D deficiency 08/01/2016  . History of IBS 08/01/2016  . Plantar fasciitis 08/01/2016  . Raynaud's disease without gangrene 08/01/2016  . History of hypertension 08/01/2016  . High risk medication use 06/18/2016  .  Hypertension   . HSV (herpes simplex virus) infection   . WVPXTGGY(694.8)     Past Medical History:  Diagnosis Date  . HSV (herpes simplex virus) infection   . Hypertension   . Lupus (HCC)    sle and discoid lupus  . Osteoporosis 2017   T score -3.3 at [facility]      Family History  Problem Relation Age of Onset  . Diabetes Mother   . Hypertension Mother   . Thyroid disease Mother   . Heart disease Mother   . Breast cancer Mother        Age 21  . Diabetes Father   . Hypertension Father   . Cancer Father        COLON  . Heart disease Father   . Diabetes Sister   . Crohn's disease Sister   . Hypertension Sister   . Stroke Sister   . Hypertension Brother   . Colitis Sister   . Diabetes Maternal Grandmother   . Diabetes Paternal Grandmother   . Diabetes Paternal Grandfather    Past Surgical History:  Procedure Laterality Date  . ABDOMINAL HYSTERECTOMY  1987   TAH  . FOOT SURGERY    . ROTATOR CUFF REPAIR  2006  . SHOULDER SURGERY FOR BONE SPUR  2010  . STRESS FRACTURE LEG  08/2007  . TUBAL LIGATION  1978   Social History   Social History Narrative  . Not on file     Objective: Vital Signs: BP 106/69 (BP Location: Left Arm, Patient Position: Sitting, Cuff Size: Small)   Pulse 66   Resp 13   Ht 5' 4.5" (1.638 m)   Wt 139 lb (63 kg)   BMI 23.49 kg/m    Physical Exam  Constitutional: She is oriented to person, place, and time. She appears well-developed and well-nourished.  HENT:  Head: Normocephalic and atraumatic.  No oral or nasal ulcerations are noted.  No parotid swelling.  Eyes: Conjunctivae and EOM are normal.  Neck: Normal range of motion.  Cardiovascular: Normal rate, regular rhythm, normal heart sounds and intact distal pulses.  Pulmonary/Chest: Effort normal and breath sounds normal.  Abdominal: Soft. Bowel sounds are normal.  Lymphadenopathy:    She has no cervical adenopathy.  Neurological: She is alert and oriented to person, place, and time.  Skin: Skin is warm and dry. Capillary refill takes less than 2 seconds.  Psychiatric: She has a normal mood and affect. Her behavior is normal.  Nursing note and vitals reviewed.    Musculoskeletal Exam: C-spine, thoracic spine, lumbar spine good range of motion.  No  midline spinal tenderness.  No SI joint tenderness.  Shoulder joints, elbow joints, wrist joints, MCPs, PIPs, DIPs good range of motion with no synovitis.  She has PIP and DIP synovial thickening consistent with osteoarthritis.  She is under deviation more prominent in her right hand.  Hip joints, knee joints, ankle joints, MTPs, PIPs, DIPs good range of motion with no synovitis.  PIP and DIP synovial thickening consistent with osteoarthritis of bilateral feet.  She has mild warmth of her right knee but no effusion.  No tenderness of trochanteric bursa.  CDAI Exam: No CDAI exam completed.    Investigation: No additional findings. CBC Latest Ref Rng & Units 11/30/2017 05/25/2017 01/09/2017  WBC 3.8 - 10.8 Thousand/uL 6.3 7.5 5.6  Hemoglobin 11.7 - 15.5 g/dL 12.4 12.8 13.6  Hematocrit 35.0 - 45.0 % 36.5 38.0 41.5  Platelets 140 - 400 Thousand/uL 286  277 294   CMP Latest Ref Rng & Units 11/30/2017 05/25/2017 08/04/2016  Glucose 65 - 99 mg/dL 94 88 105(H)  BUN 7 - 25 mg/dL 18 16 17   Creatinine 0.50 - 0.99 mg/dL 0.87 0.88 0.87  Sodium 135 - 146 mmol/L 138 143 139  Potassium 3.5 - 5.3 mmol/L 3.9 4.0 3.7  Chloride 98 - 110 mmol/L 102 106 103  CO2 20 - 32 mmol/L 29 30 24   Calcium 8.6 - 10.4 mg/dL 9.4 9.7 9.6  Total Protein 6.1 - 8.1 g/dL 6.4 6.9 6.9  Total Bilirubin 0.2 - 1.2 mg/dL 0.6 0.5 0.5  Alkaline Phos 33 - 130 U/L - - 69  AST 10 - 35 U/L 15 10 12   ALT 6 - 29 U/L 23 15 17     Imaging: No results found.  Speciality Comments: PLQ Eye Exam: 06/24/17 WNL @ [facility] Follow up in 1 yr    Procedures:  No procedures performed Allergies: Patient has no known allergies.   Assessment / Plan:     Visit Diagnoses: Other systemic lupus erythematosus with other organ involvement (HCC) - Positive ANA, positive Smith, positive RNP, positive Ro, positive Raynauds, positive photosensitivity, positive arthritis,DLE: She has not had any recent flares of lupus.  Her autoimmune labs on  11/30/2017 were all within normal limits.  She has no synovitis on exam today.  No oral or nasal ulcers were noted.  No cervical lymphadenopathy is palpated.  No malar rash or discoid lesions were noted.  She continues to have symptoms of Raynaud's in her fingers but no ulcers or abrasions were noted.  She has not had any sicca symptoms.  She will continue taking Plaquenil 2 mg 1 tablet every other day.  A refill of Plaquenil was sent to the pharmacy today.  Discoid lupus: She continues to have photosensitivity and wears sunscreen on a regular basis.  She recently went to [place] and developed some lesions on her upper back and top of her shoulders.  She has been using triamcinolone cream which is resolved her rash.  I stressed the importance of her wearing sunscreen on a daily basis and protecting herself from the sun.  Hypopigmentation - R knee, due to cortisone injection  High risk medication use - PLQ 200 m. g every other dayPLQ Eye Exam: 06/24/17 WNL @ [facility] Follow up in 1 yr. CBC and CMP were within normal limits on 11/30/2017.  We will repeat lab work at her next visit.  Raynaud's syndrome without gangrene: She continues to have symptoms of Raynauds in her hands.  No digital ulcerations or signs of gangrene were noted.  Trochanteric bursitis of left hip: Resolved.  Vitamin D deficiency: Her most recent vitamin D was 43 on 11/30/2017.  She was advised to continue taking vitamin D 5000 units by mouth once daily.  Age-related osteoporosis without current pathological fracture - 05/23/2016 Tscore -3.3 [facility] , She is on Fosamax 70 mg by mouth every week(1/18).   Other medical conditions are listed as follows:  History of hypertension  History of IBS    Orders: No orders of the defined types were placed in this encounter.  No orders of the defined types were placed in this encounter.   Face-to-face time spent with patient was 30 minutes. >50% of time was spent in  counseling and coordination of care.  Follow-Up Instructions: Return in about 5 months (around 05/05/2018) for Systemic lupus erythematosus.   Ofilia Neas, PA-C  Note - This record has been created using [employer].  Chart creation errors have been sought, but may not always  have been located. Such creation errors do not reflect on  the standard of medical care.

## 2017-11-20 DIAGNOSIS — M25532 Pain in left wrist: Secondary | ICD-10-CM | POA: Diagnosis not present

## 2017-11-20 DIAGNOSIS — M654 Radial styloid tenosynovitis [de Quervain]: Secondary | ICD-10-CM | POA: Diagnosis not present

## 2017-11-23 DIAGNOSIS — M654 Radial styloid tenosynovitis [de Quervain]: Secondary | ICD-10-CM | POA: Diagnosis not present

## 2017-11-23 DIAGNOSIS — M25532 Pain in left wrist: Secondary | ICD-10-CM | POA: Diagnosis not present

## 2017-11-25 DIAGNOSIS — Z6822 Body mass index (BMI) 22.0-22.9, adult: Secondary | ICD-10-CM | POA: Diagnosis not present

## 2017-11-25 DIAGNOSIS — I10 Essential (primary) hypertension: Secondary | ICD-10-CM | POA: Diagnosis not present

## 2017-11-25 DIAGNOSIS — M654 Radial styloid tenosynovitis [de Quervain]: Secondary | ICD-10-CM | POA: Diagnosis not present

## 2017-11-25 DIAGNOSIS — Z713 Dietary counseling and surveillance: Secondary | ICD-10-CM | POA: Diagnosis not present

## 2017-11-25 DIAGNOSIS — M25532 Pain in left wrist: Secondary | ICD-10-CM | POA: Diagnosis not present

## 2017-11-27 DIAGNOSIS — M25532 Pain in left wrist: Secondary | ICD-10-CM | POA: Diagnosis not present

## 2017-11-30 ENCOUNTER — Other Ambulatory Visit: Payer: Self-pay | Admitting: *Deleted

## 2017-11-30 DIAGNOSIS — E559 Vitamin D deficiency, unspecified: Secondary | ICD-10-CM

## 2017-11-30 DIAGNOSIS — M3219 Other organ or system involvement in systemic lupus erythematosus: Secondary | ICD-10-CM

## 2017-11-30 DIAGNOSIS — Z79899 Other long term (current) drug therapy: Secondary | ICD-10-CM

## 2017-12-03 ENCOUNTER — Encounter: Payer: Self-pay | Admitting: Physician Assistant

## 2017-12-03 ENCOUNTER — Ambulatory Visit (INDEPENDENT_AMBULATORY_CARE_PROVIDER_SITE_OTHER): Payer: Medicare Other | Admitting: Physician Assistant

## 2017-12-03 VITALS — BP 106/69 | HR 66 | Resp 13 | Ht 64.5 in | Wt 139.0 lb

## 2017-12-03 DIAGNOSIS — M7062 Trochanteric bursitis, left hip: Secondary | ICD-10-CM | POA: Diagnosis not present

## 2017-12-03 DIAGNOSIS — Z8679 Personal history of other diseases of the circulatory system: Secondary | ICD-10-CM

## 2017-12-03 DIAGNOSIS — L930 Discoid lupus erythematosus: Secondary | ICD-10-CM

## 2017-12-03 DIAGNOSIS — M810 Age-related osteoporosis without current pathological fracture: Secondary | ICD-10-CM | POA: Diagnosis not present

## 2017-12-03 DIAGNOSIS — Z79899 Other long term (current) drug therapy: Secondary | ICD-10-CM

## 2017-12-03 DIAGNOSIS — E559 Vitamin D deficiency, unspecified: Secondary | ICD-10-CM

## 2017-12-03 DIAGNOSIS — M3219 Other organ or system involvement in systemic lupus erythematosus: Secondary | ICD-10-CM | POA: Diagnosis not present

## 2017-12-03 DIAGNOSIS — L819 Disorder of pigmentation, unspecified: Secondary | ICD-10-CM

## 2017-12-03 DIAGNOSIS — Z8719 Personal history of other diseases of the digestive system: Secondary | ICD-10-CM | POA: Diagnosis not present

## 2017-12-03 DIAGNOSIS — I7300 Raynaud's syndrome without gangrene: Secondary | ICD-10-CM | POA: Diagnosis not present

## 2017-12-03 LAB — COMPLETE METABOLIC PANEL WITH GFR
AG Ratio: 1.8 (calc) (ref 1.0–2.5)
ALKALINE PHOSPHATASE (APISO): 50 U/L (ref 33–130)
ALT: 23 U/L (ref 6–29)
AST: 15 U/L (ref 10–35)
Albumin: 4.1 g/dL (ref 3.6–5.1)
BUN: 18 mg/dL (ref 7–25)
CALCIUM: 9.4 mg/dL (ref 8.6–10.4)
CO2: 29 mmol/L (ref 20–32)
Chloride: 102 mmol/L (ref 98–110)
Creat: 0.87 mg/dL (ref 0.50–0.99)
GFR, EST NON AFRICAN AMERICAN: 69 mL/min/{1.73_m2} (ref >=60)
GFR, Est African American: 80 mL/min/{1.73_m2} (ref >=60)
GLOBULIN: 2.3 g/dL (ref 1.9–3.7)
Glucose, Bld: 94 mg/dL (ref 65–99)
Potassium: 3.9 mmol/L (ref 3.5–5.3)
SODIUM: 138 mmol/L (ref 135–146)
Total Bilirubin: 0.6 mg/dL (ref 0.2–1.2)
Total Protein: 6.4 g/dL (ref 6.1–8.1)

## 2017-12-03 LAB — URINALYSIS, ROUTINE W REFLEX MICROSCOPIC
BILIRUBIN URINE: NEGATIVE
Bacteria, UA: NONE SEEN /HPF
GLUCOSE, UA: NEGATIVE
HYALINE CAST: NONE SEEN /LPF
Hgb urine dipstick: NEGATIVE
Ketones, ur: NEGATIVE
NITRITE: NEGATIVE
PROTEIN: NEGATIVE
RBC / HPF: NONE SEEN /HPF (ref 0–2)
Specific Gravity, Urine: 1.015 (ref 1.001–1.03)
Squamous Epithelial / LPF: NONE SEEN /HPF (ref <=5)
WBC UA: NONE SEEN /HPF (ref 0–5)
pH: 7 (ref 5.0–8.0)

## 2017-12-03 LAB — ANA: Anti Nuclear Antibody(ANA): POSITIVE — AB

## 2017-12-03 LAB — CBC WITH DIFFERENTIAL/PLATELET
BASOS PCT: 1.3 %
Basophils Absolute: 82 cells/uL (ref 0–200)
Eosinophils Absolute: 328 cells/uL (ref 15–500)
Eosinophils Relative: 5.2 %
HCT: 36.5 % (ref 35.0–45.0)
HEMOGLOBIN: 12.4 g/dL (ref 11.7–15.5)
Lymphs Abs: 1613 cells/uL (ref 850–3900)
MCH: 28.8 pg (ref 27.0–33.0)
MCHC: 34 g/dL (ref 32.0–36.0)
MCV: 84.7 fL (ref 80.0–100.0)
MONOS PCT: 9.3 %
MPV: 10.2 fL (ref 7.5–12.5)
NEUTROS ABS: 3692 {cells}/uL (ref 1500–7800)
Neutrophils Relative %: 58.6 %
PLATELETS: 286 10*3/uL (ref 140–400)
RBC: 4.31 10*6/uL (ref 3.80–5.10)
RDW: 13 % (ref 11.0–15.0)
TOTAL LYMPHOCYTE: 25.6 %
WBC mixed population: 586 cells/uL (ref 200–950)
WBC: 6.3 10*3/uL (ref 3.8–10.8)

## 2017-12-03 LAB — C3 AND C4
C3 COMPLEMENT: 103 mg/dL (ref 83–193)
C4 Complement: 23 mg/dL (ref 15–57)

## 2017-12-03 LAB — SEDIMENTATION RATE: SED RATE: 2 mm/h (ref 0–30)

## 2017-12-03 LAB — VITAMIN D 25 HYDROXY (VIT D DEFICIENCY, FRACTURES): Vit D, 25-Hydroxy: 42 ng/mL (ref 30–100)

## 2017-12-03 LAB — ANTI-NUCLEAR AB-TITER (ANA TITER)

## 2017-12-03 LAB — ANTI-DNA ANTIBODY, DOUBLE-STRANDED

## 2017-12-03 MED ORDER — HYDROXYCHLOROQUINE SULFATE 200 MG PO TABS: 200.0000 mg | ORAL_TABLET | ORAL | 0 refills | Status: DC

## 2017-12-07 DIAGNOSIS — I10 Essential (primary) hypertension: Secondary | ICD-10-CM | POA: Diagnosis not present

## 2017-12-07 DIAGNOSIS — R7303 Prediabetes: Secondary | ICD-10-CM | POA: Diagnosis not present

## 2017-12-07 NOTE — Progress Notes (Signed)
stable °

## 2017-12-08 DIAGNOSIS — Z713 Dietary counseling and surveillance: Secondary | ICD-10-CM | POA: Diagnosis not present

## 2017-12-08 DIAGNOSIS — G479 Sleep disorder, unspecified: Secondary | ICD-10-CM | POA: Diagnosis not present

## 2017-12-08 DIAGNOSIS — Z6823 Body mass index (BMI) 23.0-23.9, adult: Secondary | ICD-10-CM | POA: Diagnosis not present

## 2017-12-08 DIAGNOSIS — I10 Essential (primary) hypertension: Secondary | ICD-10-CM | POA: Diagnosis not present

## 2017-12-19 ENCOUNTER — Other Ambulatory Visit: Payer: Self-pay | Admitting: Rheumatology

## 2017-12-19 HISTORY — PX: WRIST SURGERY: SHX841

## 2017-12-21 ENCOUNTER — Other Ambulatory Visit: Payer: Self-pay | Admitting: Physician Assistant

## 2017-12-21 NOTE — Telephone Encounter (Signed)
Last Visit: 12/03/17 Next Visit: 05/12/18 Labs: 11/30/17 stable PLQ Eye Exam: 06/24/17 WNL   Okay to refill per Dr. Estanislado Pandy

## 2017-12-21 NOTE — Telephone Encounter (Signed)
Last Visit: 12/03/17 Next Visit: 05/12/18 Labs: 11/30/17 stable  Okay to refill per Dr. Estanislado Pandy

## 2017-12-28 DIAGNOSIS — I10 Essential (primary) hypertension: Secondary | ICD-10-CM | POA: Diagnosis not present

## 2017-12-28 DIAGNOSIS — Z713 Dietary counseling and surveillance: Secondary | ICD-10-CM | POA: Diagnosis not present

## 2017-12-28 DIAGNOSIS — Z6823 Body mass index (BMI) 23.0-23.9, adult: Secondary | ICD-10-CM | POA: Diagnosis not present

## 2017-12-28 DIAGNOSIS — H1013 Acute atopic conjunctivitis, bilateral: Secondary | ICD-10-CM | POA: Diagnosis not present

## 2018-01-01 DIAGNOSIS — J069 Acute upper respiratory infection, unspecified: Secondary | ICD-10-CM | POA: Diagnosis not present

## 2018-01-01 DIAGNOSIS — B9789 Other viral agents as the cause of diseases classified elsewhere: Secondary | ICD-10-CM | POA: Diagnosis not present

## 2018-01-04 DIAGNOSIS — Z713 Dietary counseling and surveillance: Secondary | ICD-10-CM | POA: Diagnosis not present

## 2018-01-04 DIAGNOSIS — Z87898 Personal history of other specified conditions: Secondary | ICD-10-CM | POA: Diagnosis not present

## 2018-01-04 DIAGNOSIS — I10 Essential (primary) hypertension: Secondary | ICD-10-CM | POA: Diagnosis not present

## 2018-01-04 DIAGNOSIS — H1013 Acute atopic conjunctivitis, bilateral: Secondary | ICD-10-CM | POA: Diagnosis not present

## 2018-01-07 DIAGNOSIS — M654 Radial styloid tenosynovitis [de Quervain]: Secondary | ICD-10-CM | POA: Diagnosis not present

## 2018-01-07 HISTORY — PX: TENDON RELEASE: SHX230

## 2018-01-15 DIAGNOSIS — Z9889 Other specified postprocedural states: Secondary | ICD-10-CM | POA: Diagnosis not present

## 2018-01-27 DIAGNOSIS — M654 Radial styloid tenosynovitis [de Quervain]: Secondary | ICD-10-CM | POA: Diagnosis not present

## 2018-01-27 DIAGNOSIS — M25532 Pain in left wrist: Secondary | ICD-10-CM | POA: Diagnosis not present

## 2018-02-02 DIAGNOSIS — M25532 Pain in left wrist: Secondary | ICD-10-CM | POA: Diagnosis not present

## 2018-02-02 DIAGNOSIS — M654 Radial styloid tenosynovitis [de Quervain]: Secondary | ICD-10-CM | POA: Diagnosis not present

## 2018-02-04 DIAGNOSIS — K5730 Diverticulosis of large intestine without perforation or abscess without bleeding: Secondary | ICD-10-CM | POA: Diagnosis not present

## 2018-02-04 DIAGNOSIS — K5904 Chronic idiopathic constipation: Secondary | ICD-10-CM | POA: Diagnosis not present

## 2018-02-04 DIAGNOSIS — R140 Abdominal distension (gaseous): Secondary | ICD-10-CM | POA: Diagnosis not present

## 2018-02-04 DIAGNOSIS — M25532 Pain in left wrist: Secondary | ICD-10-CM | POA: Diagnosis not present

## 2018-02-05 DIAGNOSIS — M25532 Pain in left wrist: Secondary | ICD-10-CM | POA: Diagnosis not present

## 2018-02-05 DIAGNOSIS — M654 Radial styloid tenosynovitis [de Quervain]: Secondary | ICD-10-CM | POA: Diagnosis not present

## 2018-02-08 DIAGNOSIS — M25532 Pain in left wrist: Secondary | ICD-10-CM | POA: Diagnosis not present

## 2018-02-08 DIAGNOSIS — M654 Radial styloid tenosynovitis [de Quervain]: Secondary | ICD-10-CM | POA: Diagnosis not present

## 2018-02-10 DIAGNOSIS — M654 Radial styloid tenosynovitis [de Quervain]: Secondary | ICD-10-CM | POA: Diagnosis not present

## 2018-02-10 DIAGNOSIS — M25532 Pain in left wrist: Secondary | ICD-10-CM | POA: Diagnosis not present

## 2018-02-16 DIAGNOSIS — M25532 Pain in left wrist: Secondary | ICD-10-CM | POA: Diagnosis not present

## 2018-02-16 DIAGNOSIS — M654 Radial styloid tenosynovitis [de Quervain]: Secondary | ICD-10-CM | POA: Diagnosis not present

## 2018-02-19 DIAGNOSIS — M25532 Pain in left wrist: Secondary | ICD-10-CM | POA: Diagnosis not present

## 2018-02-19 DIAGNOSIS — M654 Radial styloid tenosynovitis [de Quervain]: Secondary | ICD-10-CM | POA: Diagnosis not present

## 2018-02-22 DIAGNOSIS — M25532 Pain in left wrist: Secondary | ICD-10-CM | POA: Diagnosis not present

## 2018-02-22 DIAGNOSIS — M654 Radial styloid tenosynovitis [de Quervain]: Secondary | ICD-10-CM | POA: Diagnosis not present

## 2018-02-25 DIAGNOSIS — M25532 Pain in left wrist: Secondary | ICD-10-CM | POA: Diagnosis not present

## 2018-02-25 DIAGNOSIS — M654 Radial styloid tenosynovitis [de Quervain]: Secondary | ICD-10-CM | POA: Diagnosis not present

## 2018-03-05 DIAGNOSIS — M25532 Pain in left wrist: Secondary | ICD-10-CM | POA: Diagnosis not present

## 2018-03-05 DIAGNOSIS — M654 Radial styloid tenosynovitis [de Quervain]: Secondary | ICD-10-CM | POA: Diagnosis not present

## 2018-03-08 DIAGNOSIS — M67442 Ganglion, left hand: Secondary | ICD-10-CM | POA: Diagnosis not present

## 2018-03-08 DIAGNOSIS — M25532 Pain in left wrist: Secondary | ICD-10-CM | POA: Diagnosis not present

## 2018-03-10 DIAGNOSIS — Z85828 Personal history of other malignant neoplasm of skin: Secondary | ICD-10-CM | POA: Diagnosis not present

## 2018-03-10 DIAGNOSIS — L930 Discoid lupus erythematosus: Secondary | ICD-10-CM | POA: Diagnosis not present

## 2018-03-10 DIAGNOSIS — D2261 Melanocytic nevi of right upper limb, including shoulder: Secondary | ICD-10-CM | POA: Diagnosis not present

## 2018-03-10 DIAGNOSIS — L821 Other seborrheic keratosis: Secondary | ICD-10-CM | POA: Diagnosis not present

## 2018-03-10 DIAGNOSIS — L820 Inflamed seborrheic keratosis: Secondary | ICD-10-CM | POA: Diagnosis not present

## 2018-03-10 DIAGNOSIS — L309 Dermatitis, unspecified: Secondary | ICD-10-CM | POA: Diagnosis not present

## 2018-03-10 DIAGNOSIS — D2262 Melanocytic nevi of left upper limb, including shoulder: Secondary | ICD-10-CM | POA: Diagnosis not present

## 2018-03-12 DIAGNOSIS — M654 Radial styloid tenosynovitis [de Quervain]: Secondary | ICD-10-CM | POA: Diagnosis not present

## 2018-03-12 DIAGNOSIS — M25532 Pain in left wrist: Secondary | ICD-10-CM | POA: Diagnosis not present

## 2018-03-14 DIAGNOSIS — J029 Acute pharyngitis, unspecified: Secondary | ICD-10-CM | POA: Diagnosis not present

## 2018-03-16 DIAGNOSIS — J028 Acute pharyngitis due to other specified organisms: Secondary | ICD-10-CM | POA: Diagnosis not present

## 2018-03-16 DIAGNOSIS — H6120 Impacted cerumen, unspecified ear: Secondary | ICD-10-CM | POA: Diagnosis not present

## 2018-03-19 DIAGNOSIS — M654 Radial styloid tenosynovitis [de Quervain]: Secondary | ICD-10-CM | POA: Diagnosis not present

## 2018-03-19 DIAGNOSIS — M25532 Pain in left wrist: Secondary | ICD-10-CM | POA: Diagnosis not present

## 2018-03-24 DIAGNOSIS — M25532 Pain in left wrist: Secondary | ICD-10-CM | POA: Diagnosis not present

## 2018-03-24 DIAGNOSIS — M654 Radial styloid tenosynovitis [de Quervain]: Secondary | ICD-10-CM | POA: Diagnosis not present

## 2018-04-05 DIAGNOSIS — M654 Radial styloid tenosynovitis [de Quervain]: Secondary | ICD-10-CM | POA: Diagnosis not present

## 2018-04-05 DIAGNOSIS — M25532 Pain in left wrist: Secondary | ICD-10-CM | POA: Diagnosis not present

## 2018-04-14 DIAGNOSIS — M654 Radial styloid tenosynovitis [de Quervain]: Secondary | ICD-10-CM | POA: Diagnosis not present

## 2018-04-14 DIAGNOSIS — M25532 Pain in left wrist: Secondary | ICD-10-CM | POA: Diagnosis not present

## 2018-04-19 DIAGNOSIS — Z23 Encounter for immunization: Secondary | ICD-10-CM | POA: Diagnosis not present

## 2018-04-26 DIAGNOSIS — M654 Radial styloid tenosynovitis [de Quervain]: Secondary | ICD-10-CM | POA: Diagnosis not present

## 2018-04-26 DIAGNOSIS — M25531 Pain in right wrist: Secondary | ICD-10-CM | POA: Diagnosis not present

## 2018-04-29 NOTE — Progress Notes (Deleted)
Office Visit Note  Patient: Theresa Graham             Date of Birth: 09-09-1950           MRN: [id number]             PCP: Carol Ada, MD Referring: Carol Ada, MD Visit Date: 05/12/2018 Occupation: @GUAROCC @  Subjective:  No chief complaint on file.   History of Present Illness: Theresa Graham is a 67 y.o. female ***   Activities of Daily Living:  Patient reports morning stiffness for *** {minute/hour:19697}.   Patient {ACTIONS;DENIES/REPORTS:21021675::"Denies"} nocturnal pain.  Difficulty dressing/grooming: {ACTIONS;DENIES/REPORTS:21021675::"Denies"} Difficulty climbing stairs: {ACTIONS;DENIES/REPORTS:21021675::"Denies"} Difficulty getting out of chair: {ACTIONS;DENIES/REPORTS:21021675::"Denies"} Difficulty using hands for taps, buttons, cutlery, and/or writing: {ACTIONS;DENIES/REPORTS:21021675::"Denies"}  No Rheumatology ROS completed.   PMFS History:  Patient Active Problem List   Diagnosis Date Noted  . Trochanteric bursitis of left hip 01/08/2017  . 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 Age-related osteoporosis without current pathological fracture 08/04/2016  . Systemic lupus erythematosus ([place]) 08/01/2016  . Vitamin D deficiency 08/01/2016  . History of IBS 08/01/2016  . Plantar fasciitis 08/01/2016  . Raynaud's disease without gangrene 08/01/2016  . History of hypertension 08/01/2016  . High risk medication use 06/18/2016  . Hypertension   . HSV (herpes simplex virus) infection   . EQASTMHD(622.2)     Past Medical History:  Diagnosis Date  . HSV (herpes simplex virus) infection   . Hypertension   . Lupus (HCC)    sle and discoid lupus  . Osteoporosis 2017   T score -3.3 at [facility]    Family History  Problem Relation Age of Onset  . Diabetes Mother   . Hypertension Mother   . Thyroid disease Mother   . Heart disease Mother   . Breast cancer Mother        Age 35  . Diabetes Father   . Hypertension Father   . Cancer Father        COLON  . Heart  disease Father   . Diabetes Sister   . Crohn's disease Sister   . Hypertension Sister   . Stroke Sister   . Hypertension Brother   . Colitis Sister   . Diabetes Maternal Grandmother   . Diabetes Paternal Grandmother   . Diabetes Paternal Grandfather    Past Surgical History:  Procedure Laterality Date  . ABDOMINAL HYSTERECTOMY  1987   TAH  . FOOT SURGERY    . ROTATOR CUFF REPAIR  2006  . SHOULDER SURGERY FOR BONE SPUR  2010  . STRESS FRACTURE LEG  08/2007  . TUBAL LIGATION  1978   Social History   Social History Narrative  . Not on file    Objective: Vital Signs: There were no vitals taken for this visit.   Physical Exam   Musculoskeletal Exam: ***  CDAI Exam: CDAI Score: Not documented Patient Global Assessment: Not documented; Provider Global Assessment: Not documented Swollen: Not documented; Tender: Not documented Joint Exam   Not documented   There is currently no information documented on the homunculus. Go to the Rheumatology activity and complete the homunculus joint exam.  Investigation: No additional findings.  Imaging: No results found.  Recent Labs: Lab Results  Component Value Date   WBC 6.3 11/30/2017   HGB 12.4 11/30/2017   PLT 286 11/30/2017   NA 138 11/30/2017   K 3.9 11/30/2017   CL 102 11/30/2017   CO2 29 11/30/2017   GLUCOSE 94 11/30/2017  BUN 18 11/30/2017   CREATININE 0.87 11/30/2017   BILITOT 0.6 11/30/2017   ALKPHOS 69 08/04/2016   AST 15 11/30/2017   ALT 23 11/30/2017   PROT 6.4 11/30/2017   ALBUMIN 4.3 08/04/2016   CALCIUM 9.4 11/30/2017   GFRAA 80 11/30/2017    Speciality Comments: PLQ Eye Exam: 06/24/17 WNL @ [facility] Follow up in 1 yr  Procedures:  No procedures performed Allergies: Patient has no known allergies.   Assessment / Plan:     Visit Diagnoses: No diagnosis found.   Orders: No orders of the defined types were placed in this encounter.  No orders of the defined types were  placed in this encounter.   Face-to-face time spent with patient was *** minutes. Greater than 50% of time was spent in counseling and coordination of care.  Follow-Up Instructions: No follow-ups on file.   Earnestine Mealing, CMA  Note - This record has been created using [employer].  Chart creation errors have been sought, but may not always  have been located. Such creation errors do not reflect on  the standard of medical care.

## 2018-04-30 DIAGNOSIS — M25531 Pain in right wrist: Secondary | ICD-10-CM | POA: Diagnosis not present

## 2018-04-30 DIAGNOSIS — M654 Radial styloid tenosynovitis [de Quervain]: Secondary | ICD-10-CM | POA: Diagnosis not present

## 2018-05-10 ENCOUNTER — Telehealth: Payer: Self-pay

## 2018-05-10 NOTE — Progress Notes (Signed)
Office Visit Note  Patient: Theresa Graham             Date of Birth: 1950/08/25           MRN: [id number]             PCP: Carol Ada, MD Referring: Carol Ada, MD Visit Date: 05/24/2018 Occupation: @GUAROCC @  Subjective:  Right knee pain   History of Present Illness: Theresa Graham is a 67 y.o. female with history of systemic lupus erythematosus, osteoporosis, and Raynaud's.  She is on PLQ 200 mg 1 tablet by mouth every other day.  She denies any recent lupus flares.  She denies any recent discoid lesions, but she continues to have sun sensitivity and wears sunscreen daily. She denies any sores in her mouth or nose or sicca symptoms. She has intermittent symptoms of Raynaud's but denies digital ulcerations. She denies any hair loss.  She denies any SOB or palpitations.  She denie sany fatigue or low grade fevers.  She had left De Quervain's surgical release performed by Dr. Rhona Raider in June following 3 failed cortisone injections and PT.  She states she continues to have mild swelling but the pain has improved.  She states she started having symptoms of right sided de quervain's in September.  She had 1 cortisone injection in September, which has only minimally improved her symptoms.  She is currently wearing a right wrist immobilizer.  She continues to have intermittent right knee pain but denies any joint swelling.  She uses voltaren gel PRN.  She wears a compression sleeve while working out.  She does not feel like the pain is severe enough for a cortisone injection at this time.  She continues to take fosamax 70 mg by mouth daily.  She is scheduled for a DEXA on Wednesday at [place].   Activities of Daily Living:  Patient reports morning stiffness for 5 minutes.   Patient Denies nocturnal pain.  Difficulty dressing/grooming: Denies Difficulty climbing stairs: Denies Difficulty getting out of chair: Denies Difficulty using hands for taps, buttons, cutlery, and/or writing:  Reports  Review of Systems  Constitutional: Positive for fatigue.  HENT: Negative for mouth sores, trouble swallowing, trouble swallowing, mouth dryness and nose dryness.   Eyes: Negative for pain, redness, itching, visual disturbance and dryness.  Respiratory: Negative for cough, hemoptysis, shortness of breath, wheezing and difficulty breathing.   Cardiovascular: Negative for chest pain, palpitations, hypertension and swelling in legs/feet.  Gastrointestinal: Positive for constipation. Negative for abdominal pain, blood in stool, diarrhea, nausea and vomiting.  Endocrine: Negative for increased urination.  Genitourinary: Negative for painful urination, nocturia and pelvic pain.  Musculoskeletal: Positive for arthralgias, joint pain and morning stiffness. Negative for joint swelling, myalgias, muscle weakness, muscle tenderness and myalgias.  Skin: Negative for color change, pallor, rash, hair loss, nodules/bumps, skin tightness, ulcers and sensitivity to sunlight.  Allergic/Immunologic: Negative for susceptible to infections.  Neurological: Negative for dizziness, light-headedness, numbness, headaches, memory loss and weakness.  Hematological: Negative for swollen glands.  Psychiatric/Behavioral: Negative for depressed mood, confusion and sleep disturbance. The patient is not nervous/anxious.     PMFS History:  Patient Active Problem List   Diagnosis Date Noted  . Trochanteric bursitis of left hip 01/08/2017  . 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 Age-related osteoporosis without current pathological fracture 08/04/2016  . Systemic lupus erythematosus ([place]) 08/01/2016  . Vitamin D deficiency 08/01/2016  . History of IBS 08/01/2016  . Plantar fasciitis 08/01/2016  . Raynaud's disease without  gangrene 08/01/2016  . History of hypertension 08/01/2016  . High risk medication use 06/18/2016  . Hypertension   . HSV (herpes simplex virus) infection   . FWYOVZCH(885.0)     Past Medical History:   Diagnosis Date  . HSV (herpes simplex virus) infection   . Hypertension   . Lupus (HCC)    sle and discoid lupus  . Osteoporosis 2017   T score -3.3 at [facility]    Family History  Problem Relation Age of Onset  . Diabetes Mother   . Hypertension Mother   . Thyroid disease Mother   . Heart disease Mother   . Breast cancer Mother        Age 35  . Diabetes Father   . Hypertension Father   . Cancer Father        COLON  . Heart disease Father   . Diabetes Sister   . Crohn's disease Sister   . Hypertension Sister   . Stroke Sister   . Hypertension Brother   . Colitis Sister   . Diabetes Maternal Grandmother   . Diabetes Paternal Grandmother   . Diabetes Paternal Grandfather   . Sarcoidosis Sister   . Healthy Son   . Rheum arthritis Son    Past Surgical History:  Procedure Laterality Date  . ABDOMINAL HYSTERECTOMY  1987   TAH  . FOOT SURGERY    . ROTATOR CUFF REPAIR  2006  . SHOULDER SURGERY FOR BONE SPUR  2010  . STRESS FRACTURE LEG  08/2007  . TENDON RELEASE Left 01/07/2018  . TUBAL LIGATION  1978   Social History   Social History Narrative  . Not on file    Objective: Vital Signs: BP 120/80 (BP Location: Left Arm, Patient Position: Sitting, Cuff Size: Normal)   Pulse 68   Resp 14   Ht 5\' 4"  (1.626 m)   Wt 146 lb (66.2 kg)   BMI 25.06 kg/m    Physical Exam  Constitutional: She is oriented to person, place, and time. She appears well-developed and well-nourished.  HENT:  Head: Normocephalic and atraumatic.  No oral or nasal ulcerations. No parotid swelling.   Eyes: Conjunctivae and EOM are normal.  Neck: Normal range of motion.  Cardiovascular: Normal rate, regular rhythm, normal heart sounds and intact distal pulses.  Pulmonary/Chest: Effort normal and breath sounds normal.  Abdominal: Soft. Bowel sounds are normal.  Lymphadenopathy:    She has no cervical adenopathy.  Neurological: She is alert and oriented to person, place, and time.  Skin: Skin  is warm and dry. Capillary refill takes less than 2 seconds.  No discoid lesions.  No malar rash.  No digital ulcerations or signs of gangrene.   Psychiatric: She has a normal mood and affect. Her behavior is normal.  Nursing note and vitals reviewed.    Musculoskeletal Exam: C-spine, thoracic spine, and lumbar spine good ROM.  No midline spinal tenderness.  No SI joint tenderness.  Shoulder joints and elbow joints good ROM with no synovitis.  Bilateral de quervain's tenosynovitis.  MCPs, PIPs, and DIPs good ROM.  Complete fist formation.  Mild synovial thickening of right hand MCP joints.  No synovitis noted.  Hip joints, knee joints, ankle joints, MTPs, PIPs, and DIPs good ROM with no synovitis.  Right knee warmth.  Bilateral knee crepitus.  No tenderness or swelling of ankle joints. No achilles tendonitis or plantar fasciitis bilaterally.    CDAI Exam: CDAI Score: Not documented Patient Global Assessment: Not  documented; Provider Global Assessment: Not documented Swollen: Not documented; Tender: Not documented Joint Exam   Not documented   There is currently no information documented on the homunculus. Go to the Rheumatology activity and complete the homunculus joint exam.  Investigation: No additional findings.  Imaging: No results found.  Recent Labs: Lab Results  Component Value Date   WBC 6.3 11/30/2017   HGB 12.4 11/30/2017   PLT 286 11/30/2017   NA 138 11/30/2017   K 3.9 11/30/2017   CL 102 11/30/2017   CO2 29 11/30/2017   GLUCOSE 94 11/30/2017   BUN 18 11/30/2017   CREATININE 0.87 11/30/2017   BILITOT 0.6 11/30/2017   ALKPHOS 69 08/04/2016   AST 15 11/30/2017   ALT 23 11/30/2017   PROT 6.4 11/30/2017   ALBUMIN 4.3 08/04/2016   CALCIUM 9.4 11/30/2017   GFRAA 80 11/30/2017    Speciality Comments: PLQ Eye Exam: 06/24/17 WNL @ [facility] Follow up in 1 yr  Procedures:  No procedures performed Allergies: Patient has no known allergies.   Current  regimen includes Plaquenil 200 mg every other day.  Last CBC/CMP stable on 11/30/17.  Last PLQ eye exam 06/24/17.  CBC/CMP ordered for today.   Current regimen includes Fosamax 70 mg weekly (started on 08/04/16) along with calcium and vitamin D supplementation.  Previously treated with Fosamax with unknown duration about 10 years ago. DEXA 05/23/2016 T score -3.3 lumbar spine, BMD 0.784 which is decreased by -5%., The lowest drop in the BMD was -9% than the right femoral region.  DEXA order placed today.  Assessment / Plan:     Visit Diagnoses: Other systemic lupus erythematosus with other organ involvement (HCC) -  Positive ANA, positive Smith, positive RNP, positive Ro, positive Raynauds, positive photosensitivity, positive arthritis,DLE: She has not had any recent lupus flares.  She is clinically doing well on PLQ 200 mg 1 tablet by mouth every other day.  She has no synovitis on exam.  She has mild right knee warmth.  She declined a right knee cortisone injection today.  She has no other joint pain or joint swelling at this time.  She has no discoid lesions or malar rash.  She continues to have photosensitivity. She has no sicca symptoms. No parotid swelling.  No oral or nasal ulcerations noted.  She has intermittent symptoms of Raynaud's but she has no digital ulcerations.  She has no cervical lymphaenopathy, no worsening fatigue or low grade fevers.  She has not had any shortness of breath or palpitations.  She will continue on the current treatment regimen.  She is due to autoimmune labs, but she requested to have labs drawn at PCP office next month.  She was given a script to have sed rate, complements, dsDNA, UA, CBC, and CMP drawn.  She will follow up in the office in 5 months.  She was advised to notify us if she develops any signs or symptoms of a flare.   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  She has not had any recent rashes.  She continues to wear sunscreen on a regular basis.   We discussed the importance of wearing sunscreen and sun protective clothing.   Hypopigmentation - Right knee due to cortisone injection  High risk medication use -  PLQ 200 m. g every other day. PLQ Eye Exam: 06/24/17 WNL @ [facility] Follow up in 1 yr.  She was reminded to schedule her yearly PLQ  eye exam.  She was given a prescription with the necessary labs that she will require.  She requested to have labs drawn at PCP office at her yearly physical next month.   Raynaud's syndrome without gangrene: She has intermittent symptoms of Raynaud's.  She has no digital ulcerations or signs of gangrene at this time. We discussed the importance of keeping core body temperature warm.   Trochanteric bursitis of left hip: She has not tenderness on exam today.  She has intermittent left trochanteric bursitis.   Vitamin D deficiency: She is on Vitamin D 5,000 units by mouth daily.   Age-related osteoporosis without current pathological fracture - 05/23/2016 Tscore -3.3 [facility]. She is on Fosamax 70 mg by mouth every week(1/18).   She takes Vitamin D 5,000 units by mouth daily. She has a repeat DEXA scan on 05/26/18 at [place].    De Quervain's tenosynovitis, bilateral - Dr. Rhona Raider, Left-s/p 3 cortisone injections and surgical release in June, Right-wearing immobilizer brace, s/p 1 cortisone injection.  She will continue to follow up with Dr. Rhona Raider.    Other medical conditions are listed as follows:   History of hypertension  History of IBS   Orders: No orders of the defined types were placed in this encounter.  No orders of the defined types were placed in this encounter.    Follow-Up Instructions: Return in about 5 months (around 10/23/2018) for Systemic lupus erythematosus, Osteoporosis.   Ofilia Neas, PA-C  Note - This record has been created using Dragon software.  Chart creation errors have been sought, but may not always  have been located. Such creation errors do not  reflect on  the standard of medical care.

## 2018-05-10 NOTE — Telephone Encounter (Signed)
Patient called stating it was time for her BD at [facility] as it has been two years. Needs order faxed. I confirmed that in her chart and faxed order. Patient informed.

## 2018-05-12 ENCOUNTER — Ambulatory Visit: Payer: Medicare Other | Admitting: Rheumatology

## 2018-05-12 DIAGNOSIS — M25532 Pain in left wrist: Secondary | ICD-10-CM | POA: Diagnosis not present

## 2018-05-12 DIAGNOSIS — M654 Radial styloid tenosynovitis [de Quervain]: Secondary | ICD-10-CM | POA: Diagnosis not present

## 2018-05-12 DIAGNOSIS — M25531 Pain in right wrist: Secondary | ICD-10-CM | POA: Diagnosis not present

## 2018-05-21 DIAGNOSIS — M654 Radial styloid tenosynovitis [de Quervain]: Secondary | ICD-10-CM | POA: Diagnosis not present

## 2018-05-21 DIAGNOSIS — M25531 Pain in right wrist: Secondary | ICD-10-CM | POA: Diagnosis not present

## 2018-05-24 ENCOUNTER — Ambulatory Visit (INDEPENDENT_AMBULATORY_CARE_PROVIDER_SITE_OTHER): Payer: Medicare Other | Admitting: Physician Assistant

## 2018-05-24 ENCOUNTER — Encounter: Payer: Self-pay | Admitting: Physician Assistant

## 2018-05-24 VITALS — BP 120/80 | HR 68 | Resp 14 | Ht 64.0 in | Wt 146.0 lb

## 2018-05-24 DIAGNOSIS — Z8719 Personal history of other diseases of the digestive system: Secondary | ICD-10-CM

## 2018-05-24 DIAGNOSIS — Z8679 Personal history of other diseases of the circulatory system: Secondary | ICD-10-CM

## 2018-05-24 DIAGNOSIS — Z79899 Other long term (current) drug therapy: Secondary | ICD-10-CM | POA: Diagnosis not present

## 2018-05-24 DIAGNOSIS — M3219 Other organ or system involvement in systemic lupus erythematosus: Secondary | ICD-10-CM | POA: Diagnosis not present

## 2018-05-24 DIAGNOSIS — E559 Vitamin D deficiency, unspecified: Secondary | ICD-10-CM | POA: Diagnosis not present

## 2018-05-24 DIAGNOSIS — L819 Disorder of pigmentation, unspecified: Secondary | ICD-10-CM

## 2018-05-24 DIAGNOSIS — M654 Radial styloid tenosynovitis [de Quervain]: Secondary | ICD-10-CM | POA: Diagnosis not present

## 2018-05-24 DIAGNOSIS — I7300 Raynaud's syndrome without gangrene: Secondary | ICD-10-CM | POA: Diagnosis not present

## 2018-05-24 DIAGNOSIS — L930 Discoid lupus erythematosus: Secondary | ICD-10-CM | POA: Diagnosis not present

## 2018-05-24 DIAGNOSIS — T1512XA Foreign body in conjunctival sac, left eye, initial encounter: Secondary | ICD-10-CM | POA: Diagnosis not present

## 2018-05-24 DIAGNOSIS — M7062 Trochanteric bursitis, left hip: Secondary | ICD-10-CM

## 2018-05-24 DIAGNOSIS — M810 Age-related osteoporosis without current pathological fracture: Secondary | ICD-10-CM | POA: Diagnosis not present

## 2018-05-26 ENCOUNTER — Encounter: Payer: Self-pay | Admitting: Gynecology

## 2018-05-26 DIAGNOSIS — Z1231 Encounter for screening mammogram for malignant neoplasm of breast: Secondary | ICD-10-CM | POA: Diagnosis not present

## 2018-05-26 DIAGNOSIS — K589 Irritable bowel syndrome without diarrhea: Secondary | ICD-10-CM | POA: Diagnosis not present

## 2018-05-26 DIAGNOSIS — Z8262 Family history of osteoporosis: Secondary | ICD-10-CM | POA: Diagnosis not present

## 2018-05-26 DIAGNOSIS — Z90710 Acquired absence of both cervix and uterus: Secondary | ICD-10-CM | POA: Diagnosis not present

## 2018-05-26 DIAGNOSIS — L932 Other local lupus erythematosus: Secondary | ICD-10-CM | POA: Diagnosis not present

## 2018-05-26 DIAGNOSIS — Z803 Family history of malignant neoplasm of breast: Secondary | ICD-10-CM | POA: Diagnosis not present

## 2018-05-26 DIAGNOSIS — M810 Age-related osteoporosis without current pathological fracture: Secondary | ICD-10-CM | POA: Diagnosis not present

## 2018-05-27 ENCOUNTER — Encounter: Payer: Self-pay | Admitting: Gynecology

## 2018-05-28 ENCOUNTER — Telehealth: Payer: Self-pay | Admitting: *Deleted

## 2018-05-28 DIAGNOSIS — M654 Radial styloid tenosynovitis [de Quervain]: Secondary | ICD-10-CM | POA: Diagnosis not present

## 2018-05-28 DIAGNOSIS — M25631 Stiffness of right wrist, not elsewhere classified: Secondary | ICD-10-CM | POA: Diagnosis not present

## 2018-05-28 NOTE — Telephone Encounter (Signed)
Bone Density Scan Osteoporosis T-Score -2.8 On Fosamax  Improved. Recommendations per Dr. Estanislado Pandy continue current treatment.

## 2018-06-01 DIAGNOSIS — M3210 Systemic lupus erythematosus, organ or system involvement unspecified: Secondary | ICD-10-CM | POA: Diagnosis not present

## 2018-06-01 DIAGNOSIS — E213 Hyperparathyroidism, unspecified: Secondary | ICD-10-CM | POA: Diagnosis not present

## 2018-06-01 DIAGNOSIS — E559 Vitamin D deficiency, unspecified: Secondary | ICD-10-CM | POA: Diagnosis not present

## 2018-06-01 DIAGNOSIS — Z79899 Other long term (current) drug therapy: Secondary | ICD-10-CM | POA: Diagnosis not present

## 2018-06-02 DIAGNOSIS — M654 Radial styloid tenosynovitis [de Quervain]: Secondary | ICD-10-CM | POA: Diagnosis not present

## 2018-06-02 DIAGNOSIS — M25631 Stiffness of right wrist, not elsewhere classified: Secondary | ICD-10-CM | POA: Diagnosis not present

## 2018-06-04 DIAGNOSIS — M654 Radial styloid tenosynovitis [de Quervain]: Secondary | ICD-10-CM | POA: Diagnosis not present

## 2018-06-04 DIAGNOSIS — M25631 Stiffness of right wrist, not elsewhere classified: Secondary | ICD-10-CM | POA: Diagnosis not present

## 2018-06-07 DIAGNOSIS — E559 Vitamin D deficiency, unspecified: Secondary | ICD-10-CM | POA: Diagnosis not present

## 2018-06-07 DIAGNOSIS — E213 Hyperparathyroidism, unspecified: Secondary | ICD-10-CM | POA: Diagnosis not present

## 2018-06-07 DIAGNOSIS — M810 Age-related osteoporosis without current pathological fracture: Secondary | ICD-10-CM | POA: Diagnosis not present

## 2018-06-07 DIAGNOSIS — I10 Essential (primary) hypertension: Secondary | ICD-10-CM | POA: Diagnosis not present

## 2018-06-07 NOTE — Telephone Encounter (Signed)
Patient advised bone density scan shows improvement of Osteoporosis. Patient advised to continue current treatment of Fosamax. Patient verbalized understanding.

## 2018-06-08 DIAGNOSIS — M25631 Stiffness of right wrist, not elsewhere classified: Secondary | ICD-10-CM | POA: Diagnosis not present

## 2018-06-08 DIAGNOSIS — M654 Radial styloid tenosynovitis [de Quervain]: Secondary | ICD-10-CM | POA: Diagnosis not present

## 2018-06-09 DIAGNOSIS — M25531 Pain in right wrist: Secondary | ICD-10-CM | POA: Diagnosis not present

## 2018-06-11 DIAGNOSIS — M654 Radial styloid tenosynovitis [de Quervain]: Secondary | ICD-10-CM | POA: Diagnosis not present

## 2018-06-11 DIAGNOSIS — M25631 Stiffness of right wrist, not elsewhere classified: Secondary | ICD-10-CM | POA: Diagnosis not present

## 2018-06-28 ENCOUNTER — Other Ambulatory Visit: Payer: Self-pay | Admitting: *Deleted

## 2018-06-28 ENCOUNTER — Other Ambulatory Visit: Payer: Self-pay

## 2018-06-28 DIAGNOSIS — M3219 Other organ or system involvement in systemic lupus erythematosus: Secondary | ICD-10-CM

## 2018-06-28 DIAGNOSIS — J309 Allergic rhinitis, unspecified: Secondary | ICD-10-CM | POA: Diagnosis not present

## 2018-06-28 DIAGNOSIS — M818 Other osteoporosis without current pathological fracture: Secondary | ICD-10-CM | POA: Diagnosis not present

## 2018-06-28 DIAGNOSIS — G4700 Insomnia, unspecified: Secondary | ICD-10-CM | POA: Diagnosis not present

## 2018-06-28 DIAGNOSIS — Z0000 Encounter for general adult medical examination without abnormal findings: Secondary | ICD-10-CM | POA: Diagnosis not present

## 2018-06-28 DIAGNOSIS — I10 Essential (primary) hypertension: Secondary | ICD-10-CM | POA: Diagnosis not present

## 2018-06-28 DIAGNOSIS — R7303 Prediabetes: Secondary | ICD-10-CM | POA: Diagnosis not present

## 2018-06-28 DIAGNOSIS — Z1389 Encounter for screening for other disorder: Secondary | ICD-10-CM | POA: Diagnosis not present

## 2018-06-28 DIAGNOSIS — E7800 Pure hypercholesterolemia, unspecified: Secondary | ICD-10-CM | POA: Diagnosis not present

## 2018-06-28 DIAGNOSIS — M328 Other forms of systemic lupus erythematosus: Secondary | ICD-10-CM | POA: Diagnosis not present

## 2018-06-28 DIAGNOSIS — K589 Irritable bowel syndrome without diarrhea: Secondary | ICD-10-CM | POA: Diagnosis not present

## 2018-06-30 DIAGNOSIS — M654 Radial styloid tenosynovitis [de Quervain]: Secondary | ICD-10-CM | POA: Diagnosis not present

## 2018-06-30 DIAGNOSIS — M25631 Stiffness of right wrist, not elsewhere classified: Secondary | ICD-10-CM | POA: Diagnosis not present

## 2018-06-30 LAB — C3 AND C4
C3 Complement: 134 mg/dL (ref 83–193)
C4 Complement: 28 mg/dL (ref 15–57)

## 2018-06-30 LAB — URINALYSIS, ROUTINE W REFLEX MICROSCOPIC
BILIRUBIN URINE: NEGATIVE
Bacteria, UA: NONE SEEN /HPF
Glucose, UA: NEGATIVE
Hgb urine dipstick: NEGATIVE
Hyaline Cast: NONE SEEN /LPF
NITRITE: NEGATIVE
PROTEIN: NEGATIVE
RBC / HPF: NONE SEEN /HPF (ref 0–2)
SQUAMOUS EPITHELIAL / LPF: NONE SEEN /HPF (ref <=5)
Specific Gravity, Urine: 1.016 (ref 1.001–1.03)
pH: <=5 (ref 5.0–8.0)

## 2018-06-30 LAB — SEDIMENTATION RATE: Sed Rate: 2 mm/h (ref 0–30)

## 2018-06-30 LAB — ANTI-DNA ANTIBODY, DOUBLE-STRANDED

## 2018-06-30 NOTE — Progress Notes (Signed)
WNLs

## 2018-07-02 DIAGNOSIS — M25631 Stiffness of right wrist, not elsewhere classified: Secondary | ICD-10-CM | POA: Diagnosis not present

## 2018-07-02 DIAGNOSIS — M654 Radial styloid tenosynovitis [de Quervain]: Secondary | ICD-10-CM | POA: Diagnosis not present

## 2018-07-09 ENCOUNTER — Ambulatory Visit (INDEPENDENT_AMBULATORY_CARE_PROVIDER_SITE_OTHER): Payer: Medicare Other | Admitting: Gynecology

## 2018-07-09 ENCOUNTER — Encounter: Payer: Self-pay | Admitting: Gynecology

## 2018-07-09 VITALS — BP 122/74 | Ht 64.5 in | Wt 152.0 lb

## 2018-07-09 DIAGNOSIS — Z9289 Personal history of other medical treatment: Secondary | ICD-10-CM | POA: Diagnosis not present

## 2018-07-09 DIAGNOSIS — Z9189 Other specified personal risk factors, not elsewhere classified: Secondary | ICD-10-CM

## 2018-07-09 DIAGNOSIS — Z01419 Encounter for gynecological examination (general) (routine) without abnormal findings: Secondary | ICD-10-CM | POA: Diagnosis not present

## 2018-07-09 DIAGNOSIS — Z1272 Encounter for screening for malignant neoplasm of vagina: Secondary | ICD-10-CM | POA: Diagnosis not present

## 2018-07-09 DIAGNOSIS — M810 Age-related osteoporosis without current pathological fracture: Secondary | ICD-10-CM

## 2018-07-09 DIAGNOSIS — N952 Postmenopausal atrophic vaginitis: Secondary | ICD-10-CM

## 2018-07-09 NOTE — Patient Instructions (Signed)
Follow-up in 1 year for annual exam, sooner if any issues. 

## 2018-07-09 NOTE — Progress Notes (Signed)
    Theresa Graham 10/10/1950 [id number]        67 y.o.  G1P1001 for breast and pelvic exam doing well without gynecologic complaints.  Past medical history,surgical history, problem list, medications, allergies, family history and social history were all reviewed and documented as reviewed in the EPIC chart.  ROS:  Performed with pertinent positives and negatives included in the history, assessment and plan.   Additional significant findings : None   Exam: Caryn Bee assistant Vitals:   07/09/18 1420  BP: 122/74  Weight: 152 lb (68.9 kg)  Height: 5' 4.5" (1.638 m)   Body mass index is 25.69 kg/m.  General appearance:  Normal affect, orientation and appearance. Skin: Grossly normal HEENT: Without gross lesions.  No cervical or supraclavicular adenopathy. Thyroid normal.  Lungs:  Clear without wheezing, rales or rhonchi Cardiac: RR, without RMG Abdominal:  Soft, nontender, without masses, guarding, rebound, organomegaly or hernia Breasts:  Examined lying and sitting without masses, retractions, discharge or axillary adenopathy. Pelvic:  Ext, BUS, Vagina: With atrophic changes  Adnexa: Without masses or tenderness    Anus and perineum: Normal   Rectovaginal: Normal sphincter tone without palpated masses or tenderness.    Assessment/Plan:  67 y.o. G40P1001 female for breast and pelvic exam.   1. Postmenopausal.  Status post TAH for leiomyoma.  No significant menopausal symptoms. 2. Osteoporosis.  DEXA 2019 T score -2.8 improved from her prior DEXA.  On Fosamax for approximately 2 years.  Started at Dr. Clementeen Graham office.  Has supply but she will call when she needs more and we will plan on continuing her on the Fosamax and repeating the bone density in 2 years. 3. Mammography 05/2018.  Continue with annual mammography next year when due.  Breast exam normal today. 4. Colonoscopy 2016.  Repeat at their recommended interval. 5. History of HSV.  Without outbreaks for a number of  years.  Will call if needs refill of her acyclovir. 6. 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  Had been followed for years with stable ultrasounds.  Most recent measurement 2017 it measured 16 x 7 x 13 mm with negative color flow.  We again discussed options of stop monitoring given its stability over years time versus repeat ultrasound now and she is comfortable with stop monitoring. 7. Pap smear 2016.  Pap smear of vaginal cuff today.  No history of abnormal Pap smears.  We discussed current screening guidelines and options to stop screening based on age and hysterectomy history.  Patient is uncomfortable with stop screening at this point and we went ahead and did a Pap smear. 8. Health maintenance.  No routine lab work done as patient does this elsewhere.  Follow-up 1 year, sooner as needed.   Anastasio Auerbach MD, 3:20 PM 07/09/2018

## 2018-07-15 LAB — PAP IG W/ RFLX HPV ASCU

## 2018-07-22 DIAGNOSIS — J040 Acute laryngitis: Secondary | ICD-10-CM | POA: Diagnosis not present

## 2018-07-22 DIAGNOSIS — R05 Cough: Secondary | ICD-10-CM | POA: Diagnosis not present

## 2018-07-26 DIAGNOSIS — M25631 Stiffness of right wrist, not elsewhere classified: Secondary | ICD-10-CM | POA: Diagnosis not present

## 2018-07-26 DIAGNOSIS — M654 Radial styloid tenosynovitis [de Quervain]: Secondary | ICD-10-CM | POA: Diagnosis not present

## 2018-07-30 DIAGNOSIS — M25631 Stiffness of right wrist, not elsewhere classified: Secondary | ICD-10-CM | POA: Diagnosis not present

## 2018-07-30 DIAGNOSIS — M654 Radial styloid tenosynovitis [de Quervain]: Secondary | ICD-10-CM | POA: Diagnosis not present

## 2018-08-02 DIAGNOSIS — M654 Radial styloid tenosynovitis [de Quervain]: Secondary | ICD-10-CM | POA: Diagnosis not present

## 2018-08-02 DIAGNOSIS — M25631 Stiffness of right wrist, not elsewhere classified: Secondary | ICD-10-CM | POA: Diagnosis not present

## 2018-08-04 DIAGNOSIS — M25631 Stiffness of right wrist, not elsewhere classified: Secondary | ICD-10-CM | POA: Diagnosis not present

## 2018-08-04 DIAGNOSIS — M654 Radial styloid tenosynovitis [de Quervain]: Secondary | ICD-10-CM | POA: Diagnosis not present

## 2018-08-23 DIAGNOSIS — M654 Radial styloid tenosynovitis [de Quervain]: Secondary | ICD-10-CM | POA: Diagnosis not present

## 2018-08-23 DIAGNOSIS — M25631 Stiffness of right wrist, not elsewhere classified: Secondary | ICD-10-CM | POA: Diagnosis not present

## 2018-08-27 DIAGNOSIS — M25631 Stiffness of right wrist, not elsewhere classified: Secondary | ICD-10-CM | POA: Diagnosis not present

## 2018-08-27 DIAGNOSIS — M654 Radial styloid tenosynovitis [de Quervain]: Secondary | ICD-10-CM | POA: Diagnosis not present

## 2018-08-30 DIAGNOSIS — M25631 Stiffness of right wrist, not elsewhere classified: Secondary | ICD-10-CM | POA: Diagnosis not present

## 2018-09-01 DIAGNOSIS — M205X1 Other deformities of toe(s) (acquired), right foot: Secondary | ICD-10-CM | POA: Diagnosis not present

## 2018-09-16 ENCOUNTER — Telehealth: Payer: Self-pay | Admitting: *Deleted

## 2018-09-16 MED ORDER — ALENDRONATE SODIUM 70 MG PO TABS: ORAL_TABLET | ORAL | 3 refills | Status: DC

## 2018-09-16 NOTE — Telephone Encounter (Signed)
Patient called requesting refill on Fosamax 70 mg tablet. Per note on 07/10/19 "On Fosamax for approximately 2 years.  Started at Dr. Clementeen Graham office.  Has supply but she will call when she needs more and we will plan on continuing her on the Fosamax and repeating the bone density in 2 years."  Rx sent with refill until Dec. 2020 annual exam

## 2018-09-23 DIAGNOSIS — M654 Radial styloid tenosynovitis [de Quervain]: Secondary | ICD-10-CM | POA: Diagnosis not present

## 2018-09-23 HISTORY — PX: WRIST SURGERY: SHX841

## 2018-09-30 ENCOUNTER — Telehealth: Payer: Self-pay | Admitting: *Deleted

## 2018-09-30 MED ORDER — VALACYCLOVIR HCL 500 MG PO TABS: ORAL_TABLET | ORAL | 0 refills | Status: DC

## 2018-09-30 NOTE — Telephone Encounter (Signed)
Patient called requesting Rx for Valtrex 500 mg tablet. Per note on 07/09/18 "History of HSV.  Without outbreaks for a number of years.  Will call if needs refill.  Rx sent.

## 2018-10-01 DIAGNOSIS — M654 Radial styloid tenosynovitis [de Quervain]: Secondary | ICD-10-CM | POA: Diagnosis not present

## 2018-10-01 DIAGNOSIS — M25631 Stiffness of right wrist, not elsewhere classified: Secondary | ICD-10-CM | POA: Diagnosis not present

## 2018-10-04 DIAGNOSIS — M25631 Stiffness of right wrist, not elsewhere classified: Secondary | ICD-10-CM | POA: Diagnosis not present

## 2018-10-04 DIAGNOSIS — M654 Radial styloid tenosynovitis [de Quervain]: Secondary | ICD-10-CM | POA: Diagnosis not present

## 2018-10-06 DIAGNOSIS — M25631 Stiffness of right wrist, not elsewhere classified: Secondary | ICD-10-CM | POA: Diagnosis not present

## 2018-10-06 DIAGNOSIS — M654 Radial styloid tenosynovitis [de Quervain]: Secondary | ICD-10-CM | POA: Diagnosis not present

## 2018-10-11 DIAGNOSIS — M654 Radial styloid tenosynovitis [de Quervain]: Secondary | ICD-10-CM | POA: Diagnosis not present

## 2018-10-11 DIAGNOSIS — M25631 Stiffness of right wrist, not elsewhere classified: Secondary | ICD-10-CM | POA: Diagnosis not present

## 2018-10-13 ENCOUNTER — Encounter: Payer: Self-pay | Admitting: *Deleted

## 2018-10-13 ENCOUNTER — Telehealth: Payer: Self-pay | Admitting: Rheumatology

## 2018-10-13 DIAGNOSIS — M654 Radial styloid tenosynovitis [de Quervain]: Secondary | ICD-10-CM | POA: Diagnosis not present

## 2018-10-13 DIAGNOSIS — M25631 Stiffness of right wrist, not elsewhere classified: Secondary | ICD-10-CM | POA: Diagnosis not present

## 2018-10-13 NOTE — Telephone Encounter (Signed)
Patient left a voicemail requesting a return call to let her know if Dr. Estanislado Pandy recommends that she work from home due to her weakened immune system.

## 2018-10-13 NOTE — Telephone Encounter (Signed)
Patient states she is working at [employer] and is needing a letter for her job stating that she is high risk working in the community. Patient advised she may come by to pick up the letter today.

## 2018-10-18 DIAGNOSIS — M654 Radial styloid tenosynovitis [de Quervain]: Secondary | ICD-10-CM | POA: Diagnosis not present

## 2018-10-18 DIAGNOSIS — M25631 Stiffness of right wrist, not elsewhere classified: Secondary | ICD-10-CM | POA: Diagnosis not present

## 2018-10-20 DIAGNOSIS — M25631 Stiffness of right wrist, not elsewhere classified: Secondary | ICD-10-CM | POA: Diagnosis not present

## 2018-10-20 DIAGNOSIS — M654 Radial styloid tenosynovitis [de Quervain]: Secondary | ICD-10-CM | POA: Diagnosis not present

## 2018-10-25 DIAGNOSIS — M25631 Stiffness of right wrist, not elsewhere classified: Secondary | ICD-10-CM | POA: Diagnosis not present

## 2018-10-27 ENCOUNTER — Ambulatory Visit: Payer: Medicare Other | Admitting: Physician Assistant

## 2018-11-01 DIAGNOSIS — M25631 Stiffness of right wrist, not elsewhere classified: Secondary | ICD-10-CM | POA: Diagnosis not present

## 2018-11-01 DIAGNOSIS — M654 Radial styloid tenosynovitis [de Quervain]: Secondary | ICD-10-CM | POA: Diagnosis not present

## 2018-11-04 DIAGNOSIS — M654 Radial styloid tenosynovitis [de Quervain]: Secondary | ICD-10-CM | POA: Diagnosis not present

## 2018-11-04 DIAGNOSIS — M25631 Stiffness of right wrist, not elsewhere classified: Secondary | ICD-10-CM | POA: Diagnosis not present

## 2018-11-09 DIAGNOSIS — M25631 Stiffness of right wrist, not elsewhere classified: Secondary | ICD-10-CM | POA: Diagnosis not present

## 2018-11-09 DIAGNOSIS — M654 Radial styloid tenosynovitis [de Quervain]: Secondary | ICD-10-CM | POA: Diagnosis not present

## 2018-11-11 DIAGNOSIS — M654 Radial styloid tenosynovitis [de Quervain]: Secondary | ICD-10-CM | POA: Diagnosis not present

## 2018-11-11 DIAGNOSIS — M25631 Stiffness of right wrist, not elsewhere classified: Secondary | ICD-10-CM | POA: Diagnosis not present

## 2018-11-15 DIAGNOSIS — M654 Radial styloid tenosynovitis [de Quervain]: Secondary | ICD-10-CM | POA: Diagnosis not present

## 2018-11-15 DIAGNOSIS — M25631 Stiffness of right wrist, not elsewhere classified: Secondary | ICD-10-CM | POA: Diagnosis not present

## 2018-11-17 ENCOUNTER — Telehealth: Payer: Self-pay | Admitting: Rheumatology

## 2018-11-17 NOTE — Telephone Encounter (Signed)
Spoke with patient and advised she should contact her PCP for advisement. Patient states she is not having any symptoms at this time. Patient states the mild symptoms were at the beginning of the year and were mild. Patient states she will contact her PCP.

## 2018-11-17 NOTE — Telephone Encounter (Signed)
Patient calling to find out if it would be worth her getting tested for Covid due to her age, and having Lupus. Per patient, she did have a fever, cough earlier in the year. She heard it has been recommended to be tested to see if she may have had a mild case, and did not know it. Please call to advise.

## 2018-11-18 DIAGNOSIS — M654 Radial styloid tenosynovitis [de Quervain]: Secondary | ICD-10-CM | POA: Diagnosis not present

## 2018-11-18 DIAGNOSIS — M25631 Stiffness of right wrist, not elsewhere classified: Secondary | ICD-10-CM | POA: Diagnosis not present

## 2018-11-22 DIAGNOSIS — M25631 Stiffness of right wrist, not elsewhere classified: Secondary | ICD-10-CM | POA: Diagnosis not present

## 2018-11-22 DIAGNOSIS — Z9889 Other specified postprocedural states: Secondary | ICD-10-CM | POA: Diagnosis not present

## 2018-11-22 DIAGNOSIS — M654 Radial styloid tenosynovitis [de Quervain]: Secondary | ICD-10-CM | POA: Diagnosis not present

## 2018-11-25 DIAGNOSIS — Z20828 Contact with and (suspected) exposure to other viral communicable diseases: Secondary | ICD-10-CM | POA: Diagnosis not present

## 2018-12-01 ENCOUNTER — Other Ambulatory Visit: Payer: Self-pay | Admitting: Gynecology

## 2018-12-02 DIAGNOSIS — Z03818 Encounter for observation for suspected exposure to other biological agents ruled out: Secondary | ICD-10-CM | POA: Diagnosis not present

## 2018-12-02 DIAGNOSIS — G5761 Lesion of plantar nerve, right lower limb: Secondary | ICD-10-CM | POA: Diagnosis not present

## 2018-12-02 DIAGNOSIS — M205X1 Other deformities of toe(s) (acquired), right foot: Secondary | ICD-10-CM | POA: Diagnosis not present

## 2018-12-02 DIAGNOSIS — M2041 Other hammer toe(s) (acquired), right foot: Secondary | ICD-10-CM | POA: Diagnosis not present

## 2018-12-02 DIAGNOSIS — M21611 Bunion of right foot: Secondary | ICD-10-CM | POA: Diagnosis not present

## 2018-12-06 DIAGNOSIS — M205X2 Other deformities of toe(s) (acquired), left foot: Secondary | ICD-10-CM | POA: Diagnosis not present

## 2018-12-06 DIAGNOSIS — S90112A Contusion of left great toe without damage to nail, initial encounter: Secondary | ICD-10-CM | POA: Diagnosis not present

## 2018-12-06 NOTE — Progress Notes (Signed)
Office Visit Note  Patient: Theresa Graham             Date of Birth: 1951/03/29           MRN: [id number]             PCP: Carol Ada, MD Referring: Carol Ada, MD Visit Date: 12/20/2018 Occupation: @GUAROCC @  Subjective:  Right hand pain    History of Present Illness: Theresa Graham is a 68 y.o. female with history of systemic lupus erythematosus, discoid lupus, and osteoporosis.  She is taking Plaquenil 200 mg 1 tablet every other day.  She has not missed any doses recently.  She has had any recent lupus flares or discoid lesions.  She continues have photosensitivity and wears sunscreen on a regular basis.  She also wears an protective clothing and a hat if she plans on being outside.  She denies any signs of alopecia.  She denies any sores in her mouth or nose.  She denies any sicca symptoms.  She has intermittent symptoms of Raynaud's but denies any digital ulcerations or signs of gangrene.  She reports that she is been having increased right hand pain for the past several months.  She states is progressively been getting worse.  She states that she did have surgery for de Quervain's tenosynovitis of the right wrist in March 2020 by Dr. Demetrius Revel.  She underwent hand therapy.  She denies any joint swelling but has had stiffness in her right hand.  She has tried using Voltaren gel topically PRN.  She reports she is also having discomfort in the right knee joint and intermittent swelling.  She uses voltaren gel BID. She states the left trochanteric bursitis has resolved.     Activities of Daily Living:  Patient reports morning stiffness for 24 hours.   Patient Denies nocturnal pain.  Difficulty dressing/grooming: Denies Difficulty climbing stairs: Denies Difficulty getting out of chair: Denies Difficulty using hands for taps, buttons, cutlery, and/or writing: Denies  Review of Systems  Constitutional: Positive for fatigue.  HENT: Negative for mouth sores, mouth dryness and nose  dryness.   Eyes: Negative for pain, visual disturbance and dryness.  Respiratory: Negative for cough, hemoptysis, shortness of breath and difficulty breathing.   Cardiovascular: Negative for chest pain, palpitations, hypertension and swelling in legs/feet.  Gastrointestinal: Positive for constipation. Negative for blood in stool and diarrhea.  Endocrine: Negative for increased urination.  Genitourinary: Negative for painful urination.  Musculoskeletal: Positive for arthralgias, joint pain and morning stiffness. Negative for joint swelling, myalgias, muscle weakness, muscle tenderness and myalgias.  Skin: Negative for color change, pallor, rash, hair loss, nodules/bumps, skin tightness, ulcers and sensitivity to sunlight.  Allergic/Immunologic: Negative for susceptible to infections.  Neurological: Negative for dizziness, numbness, headaches, memory loss and weakness.  Hematological: Negative for swollen glands.  Psychiatric/Behavioral: Positive for sleep disturbance. Negative for depressed mood and confusion. The patient is not nervous/anxious.     PMFS History:  Patient Active Problem List   Diagnosis Date Noted  . Trochanteric bursitis of left hip 01/08/2017  . 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 Age-related osteoporosis without current pathological fracture 08/04/2016  . Systemic lupus erythematosus ([place]) 08/01/2016  . Vitamin D deficiency 08/01/2016  . History of IBS 08/01/2016  . Plantar fasciitis 08/01/2016  . Raynaud's disease without gangrene 08/01/2016  . History of hypertension 08/01/2016  . High risk medication use 06/18/2016  . Hypertension   . HSV (herpes simplex virus) infection   . Headache(784.0)  Past Medical History:  Diagnosis Date  . HSV (herpes simplex virus) infection   . Hypertension   . Lupus (HCC)    sle and discoid lupus  . Osteoporosis 2019   T score -2.8 statistically improved from prior study    Family History  Problem Relation Age of Onset  .  Diabetes Mother   . Hypertension Mother   . Thyroid disease Mother   . Heart disease Mother   . Breast cancer Mother        Age 60  . Diabetes Father   . Hypertension Father   . Cancer Father        COLON  . Heart disease Father   . Diabetes Sister   . Crohn's disease Sister   . Hypertension Sister   . Stroke Sister   . Hypertension Brother   . Colitis Sister   . Diabetes Maternal Grandmother   . Diabetes Paternal Grandmother   . Diabetes Paternal Grandfather   . Sarcoidosis Sister   . Healthy Son   . Rheum arthritis Son    Past Surgical History:  Procedure Laterality Date  . ABDOMINAL HYSTERECTOMY  1987   TAH  . FOOT SURGERY    . ROTATOR CUFF REPAIR  2006  . SHOULDER SURGERY FOR BONE SPUR  2010  . STRESS FRACTURE LEG  08/2007  . TENDON RELEASE Left 01/07/2018  . TUBAL LIGATION  1978  . WRIST SURGERY Right 09/23/2018   tendon release    Social History   Social History Narrative  . Not on file   Immunization History  Administered Date(s) Administered  . Influenza Split 05/05/2012  . Influenza,inj,Quad PF,6+ Mos 05/13/2013, 05/17/2014     Objective: Vital Signs: BP 125/78 (BP Location: Left Arm, Patient Position: Sitting, Cuff Size: Normal)   Pulse 68   Resp 13   Ht 5' 4.5" (1.638 m)   Wt 154 lb (69.9 kg)   BMI 26.03 kg/m    Physical Exam Vitals signs and nursing note reviewed.  Constitutional:      Appearance: She is well-developed.  HENT:     Head: Normocephalic and atraumatic.  Eyes:     Conjunctiva/sclera: Conjunctivae normal.  Neck:     Musculoskeletal: Normal range of motion.  Cardiovascular:     Rate and Rhythm: Normal rate and regular rhythm.     Heart sounds: Normal heart sounds.  Pulmonary:     Effort: Pulmonary effort is normal.     Breath sounds: Normal breath sounds.  Abdominal:     General: Bowel sounds are normal.     Palpations: Abdomen is soft.  Lymphadenopathy:     Cervical: No cervical adenopathy.  Skin:    General: Skin  is warm and dry.     Capillary Refill: Capillary refill takes less than 2 seconds.     Comments: No Malar rash noted No discoid lesions noted No digital ulcerations or signs of gangrene.  Neurological:     Mental Status: She is alert and oriented to person, place, and time.  Psychiatric:        Behavior: Behavior normal.      Musculoskeletal Exam: C-spine, thoracic spine, lumbar spine good range of motion.  No midline spinal tenderness.  No SI joint tenderness.  Shoulder joints, elbow joints, strength, MCPs and PIPs, DIPs good range of motion no synovitis.  She has synovial thickening of the right second and third MCP joints.  She has complete fist formation bilaterally.  Hip joints, knee joints,  ankle joints, MTPs, PIPs, DIPs good range of motion no synovitis.  No warmth or effusion of bilateral knee joints.  Bilateral knee crepitus palpated.  No tenderness or swelling of ankle joints.  No tenderness over trochanteric bursa bilaterally.  CDAI Exam: CDAI Score: Not documented Patient Global Assessment: Not documented; Provider Global Assessment: Not documented Swollen: Not documented; Tender: Not documented Joint Exam   Not documented   There is currently no information documented on the homunculus. Go to the Rheumatology activity and complete the homunculus joint exam.  Investigation: No additional findings.  Imaging: No results found.  Recent Labs: Lab Results  Component Value Date   WBC 6.3 11/30/2017   HGB 12.4 11/30/2017   PLT 286 11/30/2017   NA 138 11/30/2017   K 3.9 11/30/2017   CL 102 11/30/2017   CO2 29 11/30/2017   GLUCOSE 94 11/30/2017   BUN 18 11/30/2017   CREATININE 0.87 11/30/2017   BILITOT 0.6 11/30/2017   ALKPHOS 69 08/04/2016   AST 15 11/30/2017   ALT 23 11/30/2017   PROT 6.4 11/30/2017   ALBUMIN 4.3 08/04/2016   CALCIUM 9.4 11/30/2017   GFRAA 80 11/30/2017    Speciality Comments: PLQ Eye Exam: 06/24/17 WNL @ [facility] Follow up in  1 yr  Procedures:  No procedures performed Allergies: Patient has no known allergies.   Assessment / Plan:     Visit Diagnoses: Other systemic lupus erythematosus with other organ involvement (HCC) - Positive ANA, positive Smith, positive RNP, positive Ro, positive Raynauds, positive photosensitivity, positive arthritis,DLE: She has not had any recent signs or symptoms of a lupus flare.  She continues to take Plaquenil 200 mg 1 tablet by mouth every other day.  She is been experiencing increased pain in her right hand.  She is synovial thickening of the right second and third MCP joints but no tenderness or synovitis was noted..  Minor ulnar deviation was noted.  She has complete fist formation bilaterally.  She is been experiencing increased right knee joint pain but no warmth or effusion was noted on exam today.  She has been using Voltaren gel twice daily PRN.  She has not had any recent discoid lesions or malar rash.  She continues to photosensitivity and we discussed the importance of wearing sunscreen on a regular basis and reapplying every 1-2 hours.  She denies any oral or nasal ulcerations.  She denies any sicca symptoms.  She has very mild symptoms of Raynaud's but denies any digital ulcerations or signs of gangrene.  She will continue on Plaquenil 10 mg 1 tablet by mouth every other day.  We will obtain lab work today.  RF, CCP, and 14-3-3 eta were also checked due to increased bilateral hand pain.  She will be scheduled for a ultrasound of both hands to assess for synovitis.  She was advised to notify us if she develops any new or worsening symptoms.  She will follow-up in the office in 5 months.- Plan: CBC with Differential/Platelet, COMPLETE METABOLIC PANEL WITH GFR, Urinalysis, Routine w reflex microscopic, Anti-DNA antibody, double-stranded, C3 and C4, Sedimentation rate  Discoid lupus: She has not had any recent discoid lesions.  She has not had any recent rashes or signs of alopecia. She  continues to have photosensitivity. She wears a daily facial moisturizer with spf and applies additional sunscreen if she plans on being exposed to the sun.  We discussed the importance of wearing sun protective clothing and a wide brim hat.  Hypopigmentation - Right knee due to cortisone injection  High risk medication use -Plaquenil 200 mg every other day.  Last Plaquenil eye exam normal on 06/24/2017.  Most recent CBC/CMP stable on 11/30/17.    CBC/CMP ordered for today and will monitor every 5 months. - Plan: CBC with Differential/Platelet, COMPLETE METABOLIC PANEL WITH GFR  Pain in both hands -She has been experiencing increased pain and stiffness in bilateral hands, especially the right hand.  She has synovial thickening of the right second and third MCP joints.  No tenderness or synovitis was noted on exam today.  Been experiencing pain and stiffness in the right hand.  We will check sed rate, RF, CCP, and 14 3 3  eta today.  She will be scheduled for an ultrasound of both hands to assess for synovitis.  Plan: Sedimentation rate, 14-3-3 eta Protein, Cyclic citrul peptide antibody, IgG, Rheumatoid factor   Raynaud's syndrome without gangrene: She has intermittent symptoms of Raynaud's.  She has no digital ulcerations or signs or gangrene.  She takes aspirin 81 mg po daily and PLQ 200 mg 1 tablet by mouth every other day.  She will continue on this current treatment regimen.   Trochanteric bursitis of left hip: She has no tenderness on exam.    Age-related osteoporosis without current pathological fracture - 05/26/2018 DEXA T-score: -2.8, BMD: 0.845. She is on Fosamax 70 mg weekly (started on 08/04/16) along with calcium and vitamin D supplementation.  Previously treated with Fosamax with unknown duration about 10 years ago. DEXA 05/26/2018 T score -2.8 lumbar spine, BMD 0.845 a positive 8% change from last reading in November 2017. Last vitamin D level 42 on 11/30/17.  Vitamin D deficiency: She is  taking a vitamin D supplement.   De Quervain's tenosynovitis, bilateral - Dr. Rhona Raider, Left-s/p 3 cortisone injections and surgical release in June, Right-wearing immobilizer brace, s/p 1 cortisone injection.  Doing well.  She has no discomfort at this time.   Other medical conditions are listed as follows:   History of IBS  History of hypertension    Orders: Orders Placed This Encounter  Procedures  . CBC with Differential/Platelet  . COMPLETE METABOLIC PANEL WITH GFR  . Urinalysis, Routine w reflex microscopic  . Anti-DNA antibody, double-stranded  . C3 and C4  . Sedimentation rate  . 14-3-3 eta Protein  . Cyclic citrul peptide antibody, IgG  . Rheumatoid factor   No orders of the defined types were placed in this encounter.   Face-to-face time spent with patient was 30 minutes. Greater than 50% of time was spent in counseling and coordination of care.  Follow-Up Instructions: Return in about 5 months (around 05/22/2019) for Systemic lupus erythematosus.   Ofilia Neas, PA-C  Note - This record has been created using Dragon software.  Chart creation errors have been sought, but may not always  have been located. Such creation errors do not reflect on  the standard of medical care.

## 2018-12-20 ENCOUNTER — Ambulatory Visit (INDEPENDENT_AMBULATORY_CARE_PROVIDER_SITE_OTHER): Payer: Medicare Other | Admitting: Physician Assistant

## 2018-12-20 ENCOUNTER — Other Ambulatory Visit: Payer: Self-pay

## 2018-12-20 ENCOUNTER — Encounter: Payer: Self-pay | Admitting: Physician Assistant

## 2018-12-20 VITALS — BP 125/78 | HR 68 | Resp 13 | Ht 64.5 in | Wt 154.0 lb

## 2018-12-20 DIAGNOSIS — Z8679 Personal history of other diseases of the circulatory system: Secondary | ICD-10-CM

## 2018-12-20 DIAGNOSIS — M3219 Other organ or system involvement in systemic lupus erythematosus: Secondary | ICD-10-CM | POA: Diagnosis not present

## 2018-12-20 DIAGNOSIS — M654 Radial styloid tenosynovitis [de Quervain]: Secondary | ICD-10-CM | POA: Diagnosis not present

## 2018-12-20 DIAGNOSIS — I7300 Raynaud's syndrome without gangrene: Secondary | ICD-10-CM

## 2018-12-20 DIAGNOSIS — L930 Discoid lupus erythematosus: Secondary | ICD-10-CM | POA: Diagnosis not present

## 2018-12-20 DIAGNOSIS — Z79899 Other long term (current) drug therapy: Secondary | ICD-10-CM | POA: Diagnosis not present

## 2018-12-20 DIAGNOSIS — M810 Age-related osteoporosis without current pathological fracture: Secondary | ICD-10-CM | POA: Diagnosis not present

## 2018-12-20 DIAGNOSIS — M7062 Trochanteric bursitis, left hip: Secondary | ICD-10-CM

## 2018-12-20 DIAGNOSIS — E559 Vitamin D deficiency, unspecified: Secondary | ICD-10-CM | POA: Diagnosis not present

## 2018-12-20 DIAGNOSIS — L819 Disorder of pigmentation, unspecified: Secondary | ICD-10-CM

## 2018-12-20 DIAGNOSIS — M79642 Pain in left hand: Secondary | ICD-10-CM | POA: Diagnosis not present

## 2018-12-20 DIAGNOSIS — M79641 Pain in right hand: Secondary | ICD-10-CM

## 2018-12-20 DIAGNOSIS — Z8719 Personal history of other diseases of the digestive system: Secondary | ICD-10-CM | POA: Diagnosis not present

## 2018-12-21 NOTE — Progress Notes (Signed)
RBC count is borderline elevated.  Rest of CBC WNL.  CMP WNL.  Sed rate WNL.  RF negative. Complements WNL.   UA revealed trace leukocytes.  Please notify patient and advise her to follow up with PCP for further evaluation if she is experiencing symptoms of a UTI

## 2018-12-23 DIAGNOSIS — L602 Onychogryphosis: Secondary | ICD-10-CM | POA: Diagnosis not present

## 2018-12-23 DIAGNOSIS — M2041 Other hammer toe(s) (acquired), right foot: Secondary | ICD-10-CM | POA: Diagnosis not present

## 2018-12-23 DIAGNOSIS — G5761 Lesion of plantar nerve, right lower limb: Secondary | ICD-10-CM | POA: Diagnosis not present

## 2018-12-25 LAB — COMPLETE METABOLIC PANEL WITH GFR
AG Ratio: 1.4 (calc) (ref 1.0–2.5)
ALT: 20 U/L (ref 6–29)
AST: 14 U/L (ref 10–35)
Albumin: 4.6 g/dL (ref 3.6–5.1)
Alkaline phosphatase (APISO): 51 U/L (ref 37–153)
BUN: 17 mg/dL (ref 7–25)
CO2: 25 mmol/L (ref 20–32)
Calcium: 10.2 mg/dL (ref 8.6–10.4)
Chloride: 103 mmol/L (ref 98–110)
Creat: 0.93 mg/dL (ref 0.50–0.99)
GFR, Est African American: 73 mL/min/{1.73_m2} (ref >=60)
GFR, Est Non African American: 63 mL/min/{1.73_m2} (ref >=60)
Globulin: 3.4 g/dL (calc) (ref 1.9–3.7)
Glucose, Bld: 85 mg/dL (ref 65–99)
Potassium: 4.2 mmol/L (ref 3.5–5.3)
Sodium: 139 mmol/L (ref 135–146)
Total Bilirubin: 0.6 mg/dL (ref 0.2–1.2)
Total Protein: 8 g/dL (ref 6.1–8.1)

## 2018-12-25 LAB — CBC WITH DIFFERENTIAL/PLATELET
Absolute Monocytes: 431 cells/uL (ref 200–950)
Basophils Absolute: 73 cells/uL (ref 0–200)
Basophils Relative: 1.3 %
Eosinophils Absolute: 302 cells/uL (ref 15–500)
Eosinophils Relative: 5.4 %
HCT: 43.9 % (ref 35.0–45.0)
Hemoglobin: 14.5 g/dL (ref 11.7–15.5)
Lymphs Abs: 1333 cells/uL (ref 850–3900)
MCH: 28.3 pg (ref 27.0–33.0)
MCHC: 33 g/dL (ref 32.0–36.0)
MCV: 85.6 fL (ref 80.0–100.0)
MPV: 11.1 fL (ref 7.5–12.5)
Monocytes Relative: 7.7 %
Neutro Abs: 3461 cells/uL (ref 1500–7800)
Neutrophils Relative %: 61.8 %
Platelets: 239 10*3/uL (ref 140–400)
RBC: 5.13 10*6/uL — ABNORMAL HIGH (ref 3.80–5.10)
RDW: 12.9 % (ref 11.0–15.0)
Total Lymphocyte: 23.8 %
WBC: 5.6 10*3/uL (ref 3.8–10.8)

## 2018-12-25 LAB — URINALYSIS, ROUTINE W REFLEX MICROSCOPIC
Bacteria, UA: NONE SEEN /HPF
Bilirubin Urine: NEGATIVE
Glucose, UA: NEGATIVE
Hgb urine dipstick: NEGATIVE
Hyaline Cast: NONE SEEN /LPF
Ketones, ur: NEGATIVE
Nitrite: NEGATIVE
Protein, ur: NEGATIVE
Specific Gravity, Urine: 1.019 (ref 1.001–1.03)
pH: 6.5 (ref 5.0–8.0)

## 2018-12-25 LAB — SEDIMENTATION RATE: Sed Rate: 2 mm/h (ref 0–30)

## 2018-12-25 LAB — C3 AND C4
C3 Complement: 141 mg/dL (ref 83–193)
C4 Complement: 30 mg/dL (ref 15–57)

## 2018-12-25 LAB — CYCLIC CITRUL PEPTIDE ANTIBODY, IGG: Cyclic Citrullin Peptide Ab: <16 UNITS

## 2018-12-25 LAB — 14-3-3 ETA PROTEIN: 14-3-3 eta Protein: <0.2 ng/mL (ref <0.2)

## 2018-12-25 LAB — ANTI-DNA ANTIBODY, DOUBLE-STRANDED: ds DNA Ab: <1 IU/mL

## 2018-12-25 LAB — RHEUMATOID FACTOR: Rheumatoid fact SerPl-aCnc: <14 IU/mL (ref <14)

## 2018-12-27 NOTE — Progress Notes (Signed)
DsDNA negative. 14-3-3 eta negative.

## 2019-01-03 DIAGNOSIS — M329 Systemic lupus erythematosus, unspecified: Secondary | ICD-10-CM | POA: Diagnosis not present

## 2019-01-03 DIAGNOSIS — M818 Other osteoporosis without current pathological fracture: Secondary | ICD-10-CM | POA: Diagnosis not present

## 2019-01-03 DIAGNOSIS — R7303 Prediabetes: Secondary | ICD-10-CM | POA: Diagnosis not present

## 2019-01-03 DIAGNOSIS — I10 Essential (primary) hypertension: Secondary | ICD-10-CM | POA: Diagnosis not present

## 2019-01-14 DIAGNOSIS — R7303 Prediabetes: Secondary | ICD-10-CM | POA: Diagnosis not present

## 2019-01-19 ENCOUNTER — Other Ambulatory Visit: Payer: Self-pay

## 2019-01-19 ENCOUNTER — Ambulatory Visit (INDEPENDENT_AMBULATORY_CARE_PROVIDER_SITE_OTHER): Payer: Medicare Other | Admitting: Rheumatology

## 2019-01-19 ENCOUNTER — Ambulatory Visit: Payer: Self-pay

## 2019-01-19 DIAGNOSIS — M79642 Pain in left hand: Secondary | ICD-10-CM

## 2019-01-19 DIAGNOSIS — M79641 Pain in right hand: Secondary | ICD-10-CM | POA: Diagnosis not present

## 2019-01-19 NOTE — Progress Notes (Deleted)
G

## 2019-01-24 DIAGNOSIS — H903 Sensorineural hearing loss, bilateral: Secondary | ICD-10-CM | POA: Diagnosis not present

## 2019-01-24 DIAGNOSIS — L602 Onychogryphosis: Secondary | ICD-10-CM | POA: Diagnosis not present

## 2019-01-24 DIAGNOSIS — M2041 Other hammer toe(s) (acquired), right foot: Secondary | ICD-10-CM | POA: Diagnosis not present

## 2019-01-24 DIAGNOSIS — J310 Chronic rhinitis: Secondary | ICD-10-CM | POA: Diagnosis not present

## 2019-01-24 DIAGNOSIS — K089 Disorder of teeth and supporting structures, unspecified: Secondary | ICD-10-CM | POA: Diagnosis not present

## 2019-01-24 DIAGNOSIS — G8929 Other chronic pain: Secondary | ICD-10-CM | POA: Diagnosis not present

## 2019-01-24 DIAGNOSIS — G5761 Lesion of plantar nerve, right lower limb: Secondary | ICD-10-CM | POA: Diagnosis not present

## 2019-01-31 DIAGNOSIS — Z7189 Other specified counseling: Secondary | ICD-10-CM | POA: Diagnosis not present

## 2019-01-31 DIAGNOSIS — Z03818 Encounter for observation for suspected exposure to other biological agents ruled out: Secondary | ICD-10-CM | POA: Diagnosis not present

## 2019-03-09 DIAGNOSIS — Z23 Encounter for immunization: Secondary | ICD-10-CM | POA: Diagnosis not present

## 2019-03-09 DIAGNOSIS — M2041 Other hammer toe(s) (acquired), right foot: Secondary | ICD-10-CM | POA: Diagnosis not present

## 2019-03-09 DIAGNOSIS — G5761 Lesion of plantar nerve, right lower limb: Secondary | ICD-10-CM | POA: Diagnosis not present

## 2019-04-14 DIAGNOSIS — L649 Androgenic alopecia, unspecified: Secondary | ICD-10-CM | POA: Diagnosis not present

## 2019-04-14 DIAGNOSIS — Z85828 Personal history of other malignant neoplasm of skin: Secondary | ICD-10-CM | POA: Diagnosis not present

## 2019-04-14 DIAGNOSIS — K625 Hemorrhage of anus and rectum: Secondary | ICD-10-CM | POA: Diagnosis not present

## 2019-04-14 DIAGNOSIS — D225 Melanocytic nevi of trunk: Secondary | ICD-10-CM | POA: Diagnosis not present

## 2019-04-14 DIAGNOSIS — L819 Disorder of pigmentation, unspecified: Secondary | ICD-10-CM | POA: Diagnosis not present

## 2019-04-14 DIAGNOSIS — K5730 Diverticulosis of large intestine without perforation or abscess without bleeding: Secondary | ICD-10-CM | POA: Diagnosis not present

## 2019-04-14 DIAGNOSIS — K449 Diaphragmatic hernia without obstruction or gangrene: Secondary | ICD-10-CM | POA: Diagnosis not present

## 2019-04-14 DIAGNOSIS — L821 Other seborrheic keratosis: Secondary | ICD-10-CM | POA: Diagnosis not present

## 2019-04-14 DIAGNOSIS — K5904 Chronic idiopathic constipation: Secondary | ICD-10-CM | POA: Diagnosis not present

## 2019-04-14 DIAGNOSIS — K600 Acute anal fissure: Secondary | ICD-10-CM | POA: Diagnosis not present

## 2019-04-27 ENCOUNTER — Encounter: Payer: Self-pay | Admitting: Gynecology

## 2019-05-09 NOTE — Progress Notes (Signed)
Office Visit Note  Patient: Theresa Graham             Date of Birth: 1951/05/30           MRN: [id number]             PCP: Carol Ada, MD Referring: Carol Ada, MD Visit Date: 05/23/2019 Occupation: @GUAROCC @  Subjective:  Right hand stiffness    History of Present Illness: Theresa Graham is a 68 y.o. female with history of systemic lupus erythematosus, discoid lupus, and osteoarthritis.  Patient denies any recent lupus flares.  She denies any discoid lesions or rashes at this time.  She continues have photosensitivity and wears sunscreen and sun protective clothing.  She has intermittent symptoms of Raynaud's.  She takes amlodipine 5 mg by mouth daily.  She continues to have intermittent discomfort in the right knee joint and right hand.  She denies any joint swelling currently.  She states that she does experience stiffness in the right hand.  She uses Voltaren gel topically as needed for joint pain relief.  She denies any sicca symptoms.  She denies any oral or nasal ulcerations.  She denies any enlarged lymph nodes or recent fevers.  She does have chronic fatigue related to insomnia.   Activities of Daily Living:  Patient reports morning stiffness for 0 minutes.   Patient Denies nocturnal pain.  Difficulty dressing/grooming: Denies Difficulty climbing stairs: Denies Difficulty getting out of chair: Denies Difficulty using hands for taps, buttons, cutlery, and/or writing: Denies  Review of Systems  Constitutional: Negative for fatigue.  HENT: Negative for mouth sores, mouth dryness and nose dryness.   Eyes: Negative for pain, visual disturbance and dryness.  Respiratory: Negative for cough, hemoptysis, shortness of breath and difficulty breathing.   Cardiovascular: Negative for chest pain, palpitations, hypertension and swelling in legs/feet.  Gastrointestinal: Negative for blood in stool, constipation and diarrhea.  Endocrine: Negative for increased urination.   Genitourinary: Negative for painful urination.  Musculoskeletal: Positive for arthralgias and joint pain. Negative for joint swelling, myalgias, muscle weakness, morning stiffness, muscle tenderness and myalgias.  Skin: Positive for sensitivity to sunlight. Negative for color change, pallor, rash, hair loss, nodules/bumps, skin tightness and ulcers.  Allergic/Immunologic: Negative for susceptible to infections.  Neurological: Negative for dizziness, numbness, headaches and weakness.  Hematological: Negative for swollen glands.  Psychiatric/Behavioral: Positive for sleep disturbance. Negative for depressed mood. The patient is not nervous/anxious.     PMFS History:  Patient Active Problem List   Diagnosis Date Noted  . Trochanteric bursitis of left hip 01/08/2017  . 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 Age-related osteoporosis without current pathological fracture 08/04/2016  . Systemic lupus erythematosus ([place]) 08/01/2016  . Vitamin D deficiency 08/01/2016  . History of IBS 08/01/2016  . Plantar fasciitis 08/01/2016  . Raynaud's disease without gangrene 08/01/2016  . History of hypertension 08/01/2016  . High risk medication use 06/18/2016  . Hypertension   . HSV (herpes simplex virus) infection   . [id number])     Past Medical History:  Diagnosis Date  . HSV (herpes simplex virus) infection   . Hypertension   . Lupus (HCC)    sle and discoid lupus  . Osteoporosis 2019   T score -2.8 statistically improved from prior study    Family History  Problem Relation Age of Onset  . Diabetes Mother   . Hypertension Mother   . Thyroid disease Mother   . Heart disease Mother   . Breast cancer Mother  Age 49  . Diabetes Father   . Hypertension Father   . Cancer Father        COLON  . Heart disease Father   . Diabetes Sister   . Crohn's disease Sister   . Hypertension Sister   . Stroke Sister   . Hypertension Brother   . Colitis Sister   . Diabetes Maternal Grandmother   .  Diabetes Paternal Grandmother   . Diabetes Paternal Grandfather   . Sarcoidosis Sister   . Cancer Sister   . Crohn's disease Sister   . Healthy Son   . Rheum arthritis Son    Past Surgical History:  Procedure Laterality Date  . ABDOMINAL HYSTERECTOMY  1987   TAH  . FOOT SURGERY    . ROTATOR CUFF REPAIR  2006  . SHOULDER SURGERY FOR BONE SPUR  2010  . STRESS FRACTURE LEG  08/2007  . TENDON RELEASE Left 01/07/2018  . TUBAL LIGATION  1978  . WRIST SURGERY Right 09/23/2018   tendon release    Social History   Social History Narrative  . Not on file   Immunization History  Administered Date(s) Administered  . Influenza Split 05/05/2012  . Influenza,inj,Quad PF,6+ Mos 05/13/2013, 05/17/2014     Objective: Vital Signs: BP 138/73 (BP Location: Left Arm, Patient Position: Sitting, Cuff Size: Small)   Pulse 63   Resp 12   Ht 5' 4.5" (1.638 m)   Wt 148 lb 9.6 oz (67.4 kg)   BMI 25.11 kg/m    Physical Exam Vitals signs and nursing note reviewed.  Constitutional:      Appearance: She is well-developed.  HENT:     Head: Normocephalic and atraumatic.  Eyes:     Conjunctiva/sclera: Conjunctivae normal.  Neck:     Musculoskeletal: Normal range of motion.  Cardiovascular:     Rate and Rhythm: Normal rate and regular rhythm.     Heart sounds: Normal heart sounds.  Pulmonary:     Effort: Pulmonary effort is normal.     Breath sounds: Normal breath sounds.  Abdominal:     General: Bowel sounds are normal.     Palpations: Abdomen is soft.  Lymphadenopathy:     Cervical: No cervical adenopathy.  Skin:    General: Skin is warm and dry.     Capillary Refill: Capillary refill takes less than 2 seconds.     Comments: No digital ulcerations.   No malar rash noted.  No discoid lesions.   Neurological:     Mental Status: She is alert and oriented to person, place, and time.  Psychiatric:        Behavior: Behavior normal.      Musculoskeletal Exam: C-spine, thoracic spine,  lumbar spine good range of motion.  No midline spinal tenderness.  No SI joint tenderness.  Shoulder joints, elbow joints, wrist joints, MCPs, PIPs, DIPs good range of motion no synovitis.  She has synovial thickening of the right second and third MCP joints.  She has complete fist formation bilaterally.  Hip joints, knee joints and ankle joints, MTPs, PIPs, DIPs good range of motion with no synovitis.  No warmth or effusion of bilateral knee joints.  No tenderness or swelling of ankle joints.  No tenderness over trochanter bursa bilaterally.  CDAI Exam: CDAI Score: - Patient Global: -; Provider Global: - Swollen: -; Tender: - Joint Exam   No joint exam has been documented for this visit   There is currently no information documented on the  homunculus. Go to the Rheumatology activity and complete the homunculus joint exam.  Investigation: No additional findings.  Imaging: No results found.  Recent Labs: Lab Results  Component Value Date   WBC 5.6 12/20/2018   HGB 14.5 12/20/2018   PLT 239 12/20/2018   NA 139 12/20/2018   K 4.2 12/20/2018   CL 103 12/20/2018   CO2 25 12/20/2018   GLUCOSE 85 12/20/2018   BUN 17 12/20/2018   CREATININE 0.93 12/20/2018   BILITOT 0.6 12/20/2018   ALKPHOS 69 08/04/2016   AST 14 12/20/2018   ALT 20 12/20/2018   PROT 8.0 12/20/2018   ALBUMIN 4.3 08/04/2016   CALCIUM 10.2 12/20/2018   GFRAA 73 12/20/2018    Speciality Comments: PLQ Eye Exam: 06/24/17 WNL @ [facility] Follow up in 1 yr  Procedures:  No procedures performed Allergies: Patient has no known allergies.   Assessment / Plan:     Visit Diagnoses: Other systemic lupus erythematosus with other organ involvement (HCC) - Positive ANA, positive Smith, positive RNP, positive Ro, positive Raynauds, positive photosensitivity, positive arthritis,DLE: She has not had any signs or symptoms of a lupus flare.  She is clinically doing well on Plaquenil 200 mg 1 tablet by mouth every  other day.  Lab work obtained on 12/20/2018 revealed complements within normal limits, double-stranded DNA negative, sed rate 2, 14-3-3 eta negative, anti-CCP negative, RF negative.  She has not had any recent rashes.  No discoid lesions were noted.  She has not had any hair loss.  She continues to have photosensitivity and wears sunscreen and sun protective clothing.  She has intermittent symptoms of Raynaud's but no digital ulcerations or signs of gangrene were noted.  She has no synovitis on exam.  She continues to have intermittent discomfort in the right hand and right knee joint.  She has no tenderness or synovitis on exam today.  No warmth or effusion of the right knee joint was noted.  She has not had any worsening fatigue, enlarged lymph nodes, or fevers recently.  She has not had any shortness of breath or chest pain.  She is clinically doing well on Plaquenil.  She does not need any refills at this time.  She was advised to notify us if she develops any new or worsening symptoms.  We will check autoimmune lab work today.  She will follow-up in the office in 5 months.- Plan: CBC with Differential/Platelet, COMPLETE METABOLIC PANEL WITH GFR, Urinalysis, Routine w reflex microscopic, C3 and C4, Anti-DNA antibody, double-stranded, Sedimentation rate, VITAMIN D 25 Hydroxy (Vit-D Deficiency, Fractures)  Discoid lupus: She has no discoid lesions.  She continues to have photosensitivity.  She was encouraged to continue to wear sunscreen on a daily basis and wear sun protective clothing and widebrimmed hat.   High risk medication use - Plaquenil 200 mg 1 tablet every other day.  Last Plaquenil eye exam normal on 06/24/2017.  Most recent CBC/CMP within normal limits on  12/20/2018.  Due for CBC/CMP today and will monitor every 5 months. - Plan: CBC with Differential/Platelet, COMPLETE METABOLIC PANEL WITH GFR  Hypopigmentation-Right knee-s/p cortisone injection.   Raynaud's syndrome without gangrene - She has  intermittent symptoms of Raynaud's.  Symptoms were active today in the office.  She takes aspirin 81 mg po daily and Norvasc 5 mg by mouth daily.  No digital ulcerations or signs of gangrene noted.  We discussed the importance of keeping her core body temperature warm and wearing gloves and  socks on a regular basis.  We also discussed drinking warm fluids and avoiding triggers.   Age-related osteoporosis without current pathological fracture -She is on Fosamax 70 mg weekly (started on 08/04/16) along with calcium and vitamin D supplementation. Previously treated with Fosamax with unknown duration about 10 years ago. DEXA 05/26/2018 T score -2.8 lumbar spine, BMD 0.845 a positive 8% change from last reading in November 2017. Last vitamin D level 42 on 11/30/17.  We will check vitamin D level today.  Plan: VITAMIN D 25 Hydroxy (Vit-D Deficiency, Fractures)  Vitamin D deficiency - Vitamin D will be checked today. Plan: VITAMIN D 25 Hydroxy (Vit-D Deficiency, Fractures)  Trochanteric bursitis of left hip: Resolved. She has no tenderness on exam today.    De Quervain's tenosynovitis, bilateral: She has had surgery bilaterally.  She is asymptomatic at this time.   Other medical conditions are listed as follows:   History of IBS  History of hypertension  Orders: Orders Placed This Encounter  Procedures  . CBC with Differential/Platelet  . COMPLETE METABOLIC PANEL WITH GFR  . Urinalysis, Routine w reflex microscopic  . C3 and C4  . Anti-DNA antibody, double-stranded  . Sedimentation rate  . VITAMIN D 25 Hydroxy (Vit-D Deficiency, Fractures)   No orders of the defined types were placed in this encounter.   Face-to-face time spent with patient was30 minutes. Greater than 50% of time was spent in counseling and coordination of care.  Follow-Up Instructions: Return in about 5 months (around 10/21/2019) for Systemic lupus erythematosus, Osteoporosis.   Ofilia Neas, PA-C  Note - This record has  been created using Dragon software.  Chart creation errors have been sought, but may not always  have been located. Such creation errors do not reflect on  the standard of medical care.

## 2019-05-23 ENCOUNTER — Encounter: Payer: Self-pay | Admitting: Physician Assistant

## 2019-05-23 ENCOUNTER — Ambulatory Visit (INDEPENDENT_AMBULATORY_CARE_PROVIDER_SITE_OTHER): Payer: Medicare Other | Admitting: Physician Assistant

## 2019-05-23 ENCOUNTER — Other Ambulatory Visit: Payer: Self-pay

## 2019-05-23 VITALS — BP 138/73 | HR 63 | Resp 12 | Ht 64.5 in | Wt 148.6 lb

## 2019-05-23 DIAGNOSIS — L819 Disorder of pigmentation, unspecified: Secondary | ICD-10-CM | POA: Diagnosis not present

## 2019-05-23 DIAGNOSIS — Z8719 Personal history of other diseases of the digestive system: Secondary | ICD-10-CM | POA: Diagnosis not present

## 2019-05-23 DIAGNOSIS — Z79899 Other long term (current) drug therapy: Secondary | ICD-10-CM | POA: Diagnosis not present

## 2019-05-23 DIAGNOSIS — I7300 Raynaud's syndrome without gangrene: Secondary | ICD-10-CM | POA: Diagnosis not present

## 2019-05-23 DIAGNOSIS — M3219 Other organ or system involvement in systemic lupus erythematosus: Secondary | ICD-10-CM

## 2019-05-23 DIAGNOSIS — L930 Discoid lupus erythematosus: Secondary | ICD-10-CM | POA: Diagnosis not present

## 2019-05-23 DIAGNOSIS — M810 Age-related osteoporosis without current pathological fracture: Secondary | ICD-10-CM | POA: Diagnosis not present

## 2019-05-23 DIAGNOSIS — M7062 Trochanteric bursitis, left hip: Secondary | ICD-10-CM

## 2019-05-23 DIAGNOSIS — Z8679 Personal history of other diseases of the circulatory system: Secondary | ICD-10-CM | POA: Diagnosis not present

## 2019-05-23 DIAGNOSIS — E559 Vitamin D deficiency, unspecified: Secondary | ICD-10-CM

## 2019-05-23 DIAGNOSIS — M654 Radial styloid tenosynovitis [de Quervain]: Secondary | ICD-10-CM

## 2019-05-24 ENCOUNTER — Encounter: Payer: Self-pay | Admitting: *Deleted

## 2019-05-24 LAB — COMPLETE METABOLIC PANEL WITH GFR
AG Ratio: 1.6 (calc) (ref 1.0–2.5)
ALT: 19 U/L (ref 6–29)
AST: 12 U/L (ref 10–35)
Albumin: 4.5 g/dL (ref 3.6–5.1)
Alkaline phosphatase (APISO): 53 U/L (ref 37–153)
BUN: 20 mg/dL (ref 7–25)
CO2: 28 mmol/L (ref 20–32)
Calcium: 10.2 mg/dL (ref 8.6–10.4)
Chloride: 101 mmol/L (ref 98–110)
Creat: 0.84 mg/dL (ref 0.50–0.99)
GFR, Est African American: 83 mL/min/{1.73_m2} (ref >=60)
GFR, Est Non African American: 71 mL/min/{1.73_m2} (ref >=60)
Globulin: 2.9 g/dL (calc) (ref 1.9–3.7)
Glucose, Bld: 82 mg/dL (ref 65–99)
Potassium: 4 mmol/L (ref 3.5–5.3)
Sodium: 138 mmol/L (ref 135–146)
Total Bilirubin: 0.7 mg/dL (ref 0.2–1.2)
Total Protein: 7.4 g/dL (ref 6.1–8.1)

## 2019-05-24 LAB — SEDIMENTATION RATE: Sed Rate: 2 mm/h (ref 0–30)

## 2019-05-24 LAB — CBC WITH DIFFERENTIAL/PLATELET
Absolute Monocytes: 496 cells/uL (ref 200–950)
Basophils Absolute: 57 cells/uL (ref 0–200)
Basophils Relative: 1 %
Eosinophils Absolute: 279 cells/uL (ref 15–500)
Eosinophils Relative: 4.9 %
HCT: 42.3 % (ref 35.0–45.0)
Hemoglobin: 14.2 g/dL (ref 11.7–15.5)
Lymphs Abs: 1670 cells/uL (ref 850–3900)
MCH: 28.5 pg (ref 27.0–33.0)
MCHC: 33.6 g/dL (ref 32.0–36.0)
MCV: 84.9 fL (ref 80.0–100.0)
MPV: 10.4 fL (ref 7.5–12.5)
Monocytes Relative: 8.7 %
Neutro Abs: 3198 cells/uL (ref 1500–7800)
Neutrophils Relative %: 56.1 %
Platelets: 283 10*3/uL (ref 140–400)
RBC: 4.98 10*6/uL (ref 3.80–5.10)
RDW: 12.5 % (ref 11.0–15.0)
Total Lymphocyte: 29.3 %
WBC: 5.7 10*3/uL (ref 3.8–10.8)

## 2019-05-24 LAB — C3 AND C4
C3 Complement: 124 mg/dL (ref 83–193)
C4 Complement: 26 mg/dL (ref 15–57)

## 2019-05-24 LAB — URINALYSIS, ROUTINE W REFLEX MICROSCOPIC
Bilirubin Urine: NEGATIVE
Glucose, UA: NEGATIVE
Hgb urine dipstick: NEGATIVE
Ketones, ur: NEGATIVE
Leukocytes,Ua: NEGATIVE
Nitrite: NEGATIVE
Protein, ur: NEGATIVE
Specific Gravity, Urine: 1.007 (ref 1.001–1.03)
pH: 6 (ref 5.0–8.0)

## 2019-05-24 LAB — VITAMIN D 25 HYDROXY (VIT D DEFICIENCY, FRACTURES): Vit D, 25-Hydroxy: 37 ng/mL (ref 30–100)

## 2019-05-24 LAB — ANTI-DNA ANTIBODY, DOUBLE-STRANDED: ds DNA Ab: <1 IU/mL

## 2019-05-24 NOTE — Progress Notes (Signed)
CBC and CMP WNL.  UA WNL.  Sed rate WNL.  Complements WNL.  Vitamin D within desirable range.  Please recommend a maintenance dose of vitamin D.

## 2019-05-24 NOTE — Progress Notes (Signed)
DsDNA is negative.  Labs do not reveal signs of a flare.

## 2019-05-31 ENCOUNTER — Encounter: Payer: Self-pay | Admitting: Gynecology

## 2019-05-31 DIAGNOSIS — Z803 Family history of malignant neoplasm of breast: Secondary | ICD-10-CM | POA: Diagnosis not present

## 2019-05-31 DIAGNOSIS — Z1231 Encounter for screening mammogram for malignant neoplasm of breast: Secondary | ICD-10-CM | POA: Diagnosis not present

## 2019-06-08 ENCOUNTER — Encounter: Payer: Self-pay | Admitting: Physician Assistant

## 2019-06-08 DIAGNOSIS — Z79899 Other long term (current) drug therapy: Secondary | ICD-10-CM | POA: Diagnosis not present

## 2019-06-08 DIAGNOSIS — H52203 Unspecified astigmatism, bilateral: Secondary | ICD-10-CM | POA: Diagnosis not present

## 2019-06-27 DIAGNOSIS — G5761 Lesion of plantar nerve, right lower limb: Secondary | ICD-10-CM | POA: Diagnosis not present

## 2019-06-27 DIAGNOSIS — Z03818 Encounter for observation for suspected exposure to other biological agents ruled out: Secondary | ICD-10-CM | POA: Diagnosis not present

## 2019-07-07 DIAGNOSIS — Z832 Family history of diseases of the blood and blood-forming organs and certain disorders involving the immune mechanism: Secondary | ICD-10-CM | POA: Diagnosis not present

## 2019-07-08 ENCOUNTER — Other Ambulatory Visit: Payer: Self-pay

## 2019-07-11 ENCOUNTER — Encounter: Payer: Self-pay | Admitting: Gynecology

## 2019-07-11 ENCOUNTER — Ambulatory Visit (INDEPENDENT_AMBULATORY_CARE_PROVIDER_SITE_OTHER): Payer: Medicare Other | Admitting: Gynecology

## 2019-07-11 VITALS — BP 120/76 | Ht 64.5 in | Wt 149.0 lb

## 2019-07-11 DIAGNOSIS — M810 Age-related osteoporosis without current pathological fracture: Secondary | ICD-10-CM | POA: Diagnosis not present

## 2019-07-11 DIAGNOSIS — Z01419 Encounter for gynecological examination (general) (routine) without abnormal findings: Secondary | ICD-10-CM

## 2019-07-11 DIAGNOSIS — B009 Herpesviral infection, unspecified: Secondary | ICD-10-CM

## 2019-07-11 DIAGNOSIS — N952 Postmenopausal atrophic vaginitis: Secondary | ICD-10-CM | POA: Diagnosis not present

## 2019-07-11 DIAGNOSIS — N7091 Salpingitis, unspecified: Secondary | ICD-10-CM

## 2019-07-11 DIAGNOSIS — Z9289 Personal history of other medical treatment: Secondary | ICD-10-CM | POA: Diagnosis not present

## 2019-07-11 MED ORDER — ALENDRONATE SODIUM 70 MG PO TABS: ORAL_TABLET | ORAL | 4 refills | Status: DC

## 2019-07-11 NOTE — Progress Notes (Signed)
    Theresa Graham January 31, 1951 [id number]        68 y.o.  G1P1001 for breast and pelvic exam.  Without gynecologic complaints  Past medical history,surgical history, problem list, medications, allergies, family history and social history were all reviewed and documented as reviewed in the EPIC chart.  ROS:  Performed with pertinent positives and negatives included in the history, assessment and plan.   Additional significant findings : None   Exam: Caryn Bee assistant Vitals:   07/11/19 1423  BP: 120/76  Weight: 149 lb (67.6 kg)  Height: 5' 4.5" (1.638 m)   Body mass index is 25.18 kg/m.  General appearance:  Normal affect, orientation and appearance. Skin: Grossly normal HEENT: Without gross lesions.  No cervical or supraclavicular adenopathy. Thyroid normal.  Lungs:  Clear without wheezing, rales or rhonchi Cardiac: RR, without RMG Abdominal:  Soft, nontender, without masses, guarding, rebound, organomegaly or hernia Breasts:  Examined lying and sitting without masses, retractions, discharge or axillary adenopathy. Pelvic:  Ext, BUS, Vagina: With atrophic changes  Adnexa: Without masses or tenderness    Anus and perineum: Normal   Rectovaginal: Normal sphincter tone without palpated masses or tenderness.    Assessment/Plan:  68 y.o. G50P1001 female for breast and pelvic exam.  Status post TAH for leiomyoma  1. Postmenopausal.  Without significant menopausal symptoms. 2. Osteoporosis.  DEXA 2019 T score -2.8 improved from her prior DEXA.  On Fosamax for 3 years.  Refill x1 year provided.  Plan follow-up DEXA next year at 2+ year interval. 3. Mammography 05/2019.  Continue with annual mammography when due.  Breast exam normal today. 4. Colonoscopy 2016.  Repeat at their recommended interval. 5. Pap smear 2019.  No Pap smear done today.  No history of significant abnormal Pap smears. 6. History of small right hydrosalpinx.  Had been followed with serial stable ultrasounds.   Last measurement 2017 was 16 x 7 x 13 mm with negative color flow Doppler.  We again discussed screening versus observation.  She is asymptomatic.  Physical exam is normal.  We both agree to follow expectantly accepting disclaimer cannot guarantee pathology. 7. History of HSV.  Without outbreaks times years.  Will call if needs acyclovir. 8. Health maintenance.  No routine lab work done as patient does this elsewhere.  Follow-up 1 year, sooner as needed.   Anastasio Auerbach MD, 2:46 PM 07/11/2019

## 2019-07-11 NOTE — Patient Instructions (Signed)
Follow-up in 1 year for annual exam, sooner as needed. 

## 2019-08-01 ENCOUNTER — Telehealth: Payer: Self-pay

## 2019-08-01 MED ORDER — VALACYCLOVIR HCL 500 MG PO TABS: ORAL_TABLET | ORAL | 2 refills | Status: DC

## 2019-08-01 NOTE — Telephone Encounter (Signed)
Patient was in 07/11/19 to see Dr. Loetta Rough for CE but forgot to ask him for her yearly Rx for Valtrex. I do see this in her med history as a yearly Rx.  I went ahead and sent Rx with same directions as last time it was prescribed. Patient informed.

## 2019-08-03 ENCOUNTER — Telehealth: Payer: Self-pay

## 2019-08-03 MED ORDER — VALACYCLOVIR HCL 500 MG PO TABS: ORAL_TABLET | ORAL | 2 refills | Status: DC

## 2019-08-03 NOTE — Telephone Encounter (Signed)
I called patient because [facility] sent a note "Please clarify directions for the prescription Valacylovir 500 and the days supply that #90 tabs should last."  I spoke with patient to clarify. She said Dr. Loetta Rough has her take one tab bid x 5 days when she has an outbreak and the rest of the days she takes one daily.  I increased the 90 days supply quantity to #110 to cover her for outbreaks and make sure she has enough medication in that event.

## 2019-09-06 DIAGNOSIS — L309 Dermatitis, unspecified: Secondary | ICD-10-CM | POA: Diagnosis not present

## 2019-09-25 DIAGNOSIS — M7062 Trochanteric bursitis, left hip: Secondary | ICD-10-CM | POA: Diagnosis not present

## 2019-09-25 DIAGNOSIS — M25562 Pain in left knee: Secondary | ICD-10-CM | POA: Diagnosis not present

## 2019-10-05 DIAGNOSIS — M25552 Pain in left hip: Secondary | ICD-10-CM | POA: Diagnosis not present

## 2019-10-05 DIAGNOSIS — M25562 Pain in left knee: Secondary | ICD-10-CM | POA: Diagnosis not present

## 2019-10-06 DIAGNOSIS — M6281 Muscle weakness (generalized): Secondary | ICD-10-CM | POA: Diagnosis not present

## 2019-10-06 DIAGNOSIS — M7632 Iliotibial band syndrome, left leg: Secondary | ICD-10-CM | POA: Diagnosis not present

## 2019-10-06 DIAGNOSIS — M7062 Trochanteric bursitis, left hip: Secondary | ICD-10-CM | POA: Diagnosis not present

## 2019-10-10 DIAGNOSIS — M7062 Trochanteric bursitis, left hip: Secondary | ICD-10-CM | POA: Diagnosis not present

## 2019-10-10 DIAGNOSIS — M7632 Iliotibial band syndrome, left leg: Secondary | ICD-10-CM | POA: Diagnosis not present

## 2019-10-10 DIAGNOSIS — M6281 Muscle weakness (generalized): Secondary | ICD-10-CM | POA: Diagnosis not present

## 2019-10-12 DIAGNOSIS — M7632 Iliotibial band syndrome, left leg: Secondary | ICD-10-CM | POA: Diagnosis not present

## 2019-10-12 DIAGNOSIS — M7062 Trochanteric bursitis, left hip: Secondary | ICD-10-CM | POA: Diagnosis not present

## 2019-10-12 DIAGNOSIS — M6281 Muscle weakness (generalized): Secondary | ICD-10-CM | POA: Diagnosis not present

## 2019-10-17 DIAGNOSIS — M7062 Trochanteric bursitis, left hip: Secondary | ICD-10-CM | POA: Diagnosis not present

## 2019-10-17 DIAGNOSIS — M6281 Muscle weakness (generalized): Secondary | ICD-10-CM | POA: Diagnosis not present

## 2019-10-17 DIAGNOSIS — M7632 Iliotibial band syndrome, left leg: Secondary | ICD-10-CM | POA: Diagnosis not present

## 2019-10-19 DIAGNOSIS — M7632 Iliotibial band syndrome, left leg: Secondary | ICD-10-CM | POA: Diagnosis not present

## 2019-10-19 DIAGNOSIS — M7062 Trochanteric bursitis, left hip: Secondary | ICD-10-CM | POA: Diagnosis not present

## 2019-10-19 DIAGNOSIS — M6281 Muscle weakness (generalized): Secondary | ICD-10-CM | POA: Diagnosis not present

## 2019-10-20 ENCOUNTER — Ambulatory Visit: Payer: Medicare Other | Admitting: Rheumatology

## 2019-10-24 DIAGNOSIS — M7632 Iliotibial band syndrome, left leg: Secondary | ICD-10-CM | POA: Diagnosis not present

## 2019-10-24 DIAGNOSIS — M7062 Trochanteric bursitis, left hip: Secondary | ICD-10-CM | POA: Diagnosis not present

## 2019-10-24 DIAGNOSIS — M6281 Muscle weakness (generalized): Secondary | ICD-10-CM | POA: Diagnosis not present

## 2019-10-26 ENCOUNTER — Ambulatory Visit: Payer: Medicare Other | Admitting: Rheumatology

## 2019-10-28 DIAGNOSIS — M6281 Muscle weakness (generalized): Secondary | ICD-10-CM | POA: Diagnosis not present

## 2019-10-28 DIAGNOSIS — M7632 Iliotibial band syndrome, left leg: Secondary | ICD-10-CM | POA: Diagnosis not present

## 2019-10-28 DIAGNOSIS — M7062 Trochanteric bursitis, left hip: Secondary | ICD-10-CM | POA: Diagnosis not present

## 2019-10-31 DIAGNOSIS — M7062 Trochanteric bursitis, left hip: Secondary | ICD-10-CM | POA: Diagnosis not present

## 2019-10-31 DIAGNOSIS — M6281 Muscle weakness (generalized): Secondary | ICD-10-CM | POA: Diagnosis not present

## 2019-10-31 DIAGNOSIS — M7632 Iliotibial band syndrome, left leg: Secondary | ICD-10-CM | POA: Diagnosis not present

## 2019-11-02 DIAGNOSIS — M7632 Iliotibial band syndrome, left leg: Secondary | ICD-10-CM | POA: Diagnosis not present

## 2019-11-02 DIAGNOSIS — M7062 Trochanteric bursitis, left hip: Secondary | ICD-10-CM | POA: Diagnosis not present

## 2019-11-02 DIAGNOSIS — M6281 Muscle weakness (generalized): Secondary | ICD-10-CM | POA: Diagnosis not present

## 2019-11-07 DIAGNOSIS — M7632 Iliotibial band syndrome, left leg: Secondary | ICD-10-CM | POA: Diagnosis not present

## 2019-11-07 DIAGNOSIS — M7062 Trochanteric bursitis, left hip: Secondary | ICD-10-CM | POA: Diagnosis not present

## 2019-11-07 DIAGNOSIS — M6281 Muscle weakness (generalized): Secondary | ICD-10-CM | POA: Diagnosis not present

## 2019-11-09 DIAGNOSIS — M7062 Trochanteric bursitis, left hip: Secondary | ICD-10-CM | POA: Diagnosis not present

## 2019-11-09 DIAGNOSIS — M6281 Muscle weakness (generalized): Secondary | ICD-10-CM | POA: Diagnosis not present

## 2019-11-09 DIAGNOSIS — M7632 Iliotibial band syndrome, left leg: Secondary | ICD-10-CM | POA: Diagnosis not present

## 2019-11-14 DIAGNOSIS — M7632 Iliotibial band syndrome, left leg: Secondary | ICD-10-CM | POA: Diagnosis not present

## 2019-11-14 DIAGNOSIS — M6281 Muscle weakness (generalized): Secondary | ICD-10-CM | POA: Diagnosis not present

## 2019-11-14 DIAGNOSIS — M7062 Trochanteric bursitis, left hip: Secondary | ICD-10-CM | POA: Diagnosis not present

## 2019-11-16 DIAGNOSIS — M6281 Muscle weakness (generalized): Secondary | ICD-10-CM | POA: Diagnosis not present

## 2019-11-16 DIAGNOSIS — M7632 Iliotibial band syndrome, left leg: Secondary | ICD-10-CM | POA: Diagnosis not present

## 2019-11-16 DIAGNOSIS — M7062 Trochanteric bursitis, left hip: Secondary | ICD-10-CM | POA: Diagnosis not present

## 2019-11-17 DIAGNOSIS — K5730 Diverticulosis of large intestine without perforation or abscess without bleeding: Secondary | ICD-10-CM | POA: Diagnosis not present

## 2019-11-17 DIAGNOSIS — K5904 Chronic idiopathic constipation: Secondary | ICD-10-CM | POA: Diagnosis not present

## 2019-11-21 DIAGNOSIS — M7062 Trochanteric bursitis, left hip: Secondary | ICD-10-CM | POA: Diagnosis not present

## 2019-11-21 DIAGNOSIS — M6281 Muscle weakness (generalized): Secondary | ICD-10-CM | POA: Diagnosis not present

## 2019-11-21 DIAGNOSIS — M7632 Iliotibial band syndrome, left leg: Secondary | ICD-10-CM | POA: Diagnosis not present

## 2019-11-23 NOTE — Progress Notes (Signed)
Office Visit Note  Patient: Theresa Graham             Date of Birth: 1951/07/04           MRN: [id number]             PCP: Carol Ada, MD Referring: Carol Ada, MD Visit Date: 11/30/2019 Occupation: @GUAROCC @  Subjective:  Medication monitoring   History of Present Illness: Theresa Graham is a 69 y.o. female with history of systemic lupus erythematous, discoid lupus, and osteoporosis.  She is taking plaquenil 200 mg 1 tablet by mouth every other day.  She denies any recent systemic lupus flares. She has not had any discoid lesions recently.  She continues have photosensitivity and tries to avoid sun exposure and wears a daily sunscreen on her face.  She denies any hair loss.  She has intermittent symptoms of Raynaud's but denies any ulcerations.  She denies any chest pain, SOB, or palpitations.  She denies any oral or nasal ulcerations or sicca symptoms.  Denies enlarged lymph nodes.  She continues to have morning stiffness in both hands.  She is currently going to PT for left trochanteric bursitis and IT band syndrome.  She has been noticing an improvement in her discomfort and strength over the past 6 weeks.  She will be following up with Dr. Rhona Raider in June 2021. She continues to have chronic right knee joint pain and intermittent inflammation.  She uses voltaren gel topically twice daily for pain relief.  She exercises 4-5 times per week.  She is taking fosamax 70 mg 1 tablet once weekly for management of osteoporosis.   Activities of Daily Living:  Patient reports morning stiffness for 5 minutes.   Patient Denies nocturnal pain.  Difficulty dressing/grooming: Denies Difficulty climbing stairs: Denies Difficulty getting out of chair: Denies Difficulty using hands for taps, buttons, cutlery, and/or writing: Denies  Review of Systems  Constitutional: Positive for fatigue.  HENT: Negative for mouth sores, mouth dryness and nose dryness.   Eyes: Negative for pain, itching, visual  disturbance and dryness.  Respiratory: Negative for cough, hemoptysis, shortness of breath and difficulty breathing.   Cardiovascular: Negative for chest pain, palpitations, hypertension and swelling in legs/feet.  Gastrointestinal: Positive for constipation. Negative for blood in stool and diarrhea.  Endocrine: Negative for increased urination.  Genitourinary: Negative for difficulty urinating and painful urination.  Musculoskeletal: Positive for arthralgias, joint pain, joint swelling, myalgias, morning stiffness, muscle tenderness and myalgias. Negative for muscle weakness.  Skin: Negative for color change, pallor, rash, hair loss, nodules/bumps, redness, skin tightness, ulcers and sensitivity to sunlight.  Allergic/Immunologic: Negative for susceptible to infections.  Neurological: Negative for dizziness, numbness, headaches, memory loss and weakness.  Hematological: Negative for bruising/bleeding tendency and swollen glands.  Psychiatric/Behavioral: Negative for depressed mood, confusion and sleep disturbance. The patient is not nervous/anxious.     PMFS History:  Patient Active Problem List   Diagnosis Date Noted  . Trochanteric bursitis of left hip 01/08/2017  . 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 Age-related osteoporosis without current pathological fracture 08/04/2016  . Systemic lupus erythematosus ([place]) 08/01/2016  . Vitamin D deficiency 08/01/2016  . History of IBS 08/01/2016  . Plantar fasciitis 08/01/2016  . Raynaud's disease without gangrene 08/01/2016  . History of hypertension 08/01/2016  . High risk medication use 06/18/2016  . Hypertension   . HSV (herpes simplex virus) infection   . [id number])     Past Medical History:  Diagnosis Date  . HSV (  herpes simplex virus) infection   . Hypertension   . Lupus (HCC)    sle and discoid lupus  . Osteoporosis 2019   T score -2.8 statistically improved from prior study    Family History  Problem Relation Age of Onset  .  Diabetes Mother   . Hypertension Mother   . Thyroid disease Mother   . Heart disease Mother   . Breast cancer Mother        Age 102  . Diabetes Father   . Hypertension Father   . Cancer Father        COLON  . Heart disease Father   . Crohn's disease Sister   . Hypertension Sister   . Stroke Sister   . Cancer Sister        Intestinal Ca  . Hypertension Brother   . Colitis Sister   . Diabetes Maternal Grandmother   . Diabetes Paternal Grandmother   . Diabetes Paternal Grandfather   . Sarcoidosis Sister   . Cancer Sister   . Crohn's disease Sister   . Healthy Son   . Rheum arthritis Son    Past Surgical History:  Procedure Laterality Date  . ABDOMINAL HYSTERECTOMY  1987   TAH  . FOOT SURGERY    . ROTATOR CUFF REPAIR  2006  . SHOULDER SURGERY FOR BONE SPUR  2010  . STRESS FRACTURE LEG  08/2007  . TENDON RELEASE Left 01/07/2018  . TUBAL LIGATION  1978  . WRIST SURGERY Right 09/23/2018   tendon release    Social History   Social History Narrative  . Not on file   Immunization History  Administered Date(s) Administered  . Influenza Split 05/05/2012  . Influenza,inj,Quad PF,6+ Mos 05/13/2013, 05/17/2014     Objective: Vital Signs: BP 125/76 (BP Location: Right Arm, Patient Position: Sitting, Cuff Size: Normal)   Pulse 64   Resp 14   Ht 5' 4.5" (1.638 m)   Wt 138 lb 9.6 oz (62.9 kg)   BMI 23.42 kg/m    Physical Exam Vitals and nursing note reviewed.  Constitutional:      Appearance: She is well-developed.  HENT:     Head: Normocephalic and atraumatic.  Eyes:     Conjunctiva/sclera: Conjunctivae normal.  Pulmonary:     Effort: Pulmonary effort is normal.  Abdominal:     General: Bowel sounds are normal.     Palpations: Abdomen is soft.  Musculoskeletal:     Cervical back: Normal range of motion.  Lymphadenopathy:     Cervical: No cervical adenopathy.  Skin:    General: Skin is warm and dry.     Capillary Refill: Capillary refill takes less than 2  seconds.  Neurological:     Mental Status: She is alert and oriented to person, place, and time.  Psychiatric:        Behavior: Behavior normal.      Musculoskeletal Exam: C-spine, thoracic spine, and lumbar spine good ROM.  Shoulder joints, elbow joints, wrist joints, MCPs, PIPs, and DIPs good ROM with no synovitis.  Complete fist formation bilaterally.  Hip joints good ROM with no discomfort. Synovial thickening of the right knee joint.  Left knee has good ROM with no warmth or effusion.  Ankle joints good ROM with no tenderness or inflammation.   CDAI Exam: CDAI Score: -- Patient Global: --; Provider Global: -- Swollen: --; Tender: -- Joint Exam 11/30/2019   No joint exam has been documented for this visit   There is  currently no information documented on the homunculus. Go to the Rheumatology activity and complete the homunculus joint exam.  Investigation: No additional findings.  Imaging: No results found.  Recent Labs: Lab Results  Component Value Date   WBC 5.7 05/23/2019   HGB 14.2 05/23/2019   PLT 283 05/23/2019   NA 138 05/23/2019   K 4.0 05/23/2019   CL 101 05/23/2019   CO2 28 05/23/2019   GLUCOSE 82 05/23/2019   BUN 20 05/23/2019   CREATININE 0.84 05/23/2019   BILITOT 0.7 05/23/2019   ALKPHOS 69 08/04/2016   AST 12 05/23/2019   ALT 19 05/23/2019   PROT 7.4 05/23/2019   ALBUMIN 4.3 08/04/2016   CALCIUM 10.2 05/23/2019   GFRAA 83 05/23/2019    Speciality Comments: PLQ Eye Exam: 06/24/17 WNL @ [facility] Follow up in 1 yr  Procedures:  No procedures performed Allergies: Patient has no known allergies.   Assessment / Plan:     Visit Diagnoses: Other systemic lupus erythematosus with other organ involvement (HCC) - Positive ANA, positive Smith, positive RNP, positive Ro, positive Raynauds, positive photosensitivity, positive arthritis, DLE: She has not had any signs or symptoms of a systemic lupus flare recently.  She is clinically doing  well on Plaquenil 200 mg 1 tablet by mouth every other day.  She has not had any recent rashes or findings consistent with discoid lupus.  She does have ongoing photosensitivity and we discussed the importance of avoiding direct sun exposure and wearing sunscreen SPF greater than 50 on a daily basis.  No Malar rash was noted on exam today.  No signs of alopecia.  She has intermittent symptoms of Raynaud's but no digital ulcerations or signs of gangrene were noted.  No synovitis was noted on exam today.  She experiences discomfort and stiffness in both hands every morning but no inflammation was noted.  She synovial thickening of the right knee joint but no warmth or effusion was noted on exam.  She was encouraged to continue to use Voltaren gel topically as needed.  She has not had any recent oral or nasal ulcerations.  She has not had any sicca symptoms recently.  She denies any shortness of breath, chest pain, or palpitations.  Autoimmune lab work from 05/23/2019 was reviewed with the patient today in the office.  We will update autoimmune lab work today.  Orders were released.  She will continue taking Plaquenil 200 mg 1 tablet by mouth every other day.  She does not need a refill at this time.  She was advised to notify us if she develops signs or symptoms of a flare.  She will follow-up in the office in 5 months.- Plan: CBC with Differential/Platelet, Urinalysis, Routine w reflex microscopic, COMPLETE METABOLIC PANEL WITH GFR, Anti-DNA antibody, double-stranded, C3 and C4, Sedimentation rate  Discoid lupus: No recent discoid lesions. We discussed the importance of avoiding direct sun exposure and wearing sunscreen SPF>50 on a daily basis.    High risk medication use - Plaquenil 200 mg 1 tablet every other day.  CBC and CMP were within normal limits on 05/23/2019.  She is due to update lab work today.  Orders for CBC and CMP were released.- Plan: CBC with Differential/Platelet, COMPLETE METABOLIC PANEL WITH  GFR  Hypopigmentation  Raynaud's syndrome without gangrene: She experiences intermittent symptoms of Raynaud's.  No digital ulcerations or signs of gangrene were noted.  She has good capillary refill and warmth on exam today.  No signs of  cyanosis noted.  She will continue taking aspirin 81 mg 1 tablet by mouth daily, amlodipine 5 mg 1 tablet by mouth daily, Plaquenil 200 mg 1 tablet by mouth every other day.  Age-related osteoporosis without current pathological fracture - DEXA 05/26/2018 T score -2.8 lumbar spine, BMD 0.845 +8% change compared to Nov 2017. Restarted fosamax 08/04/16, previously on it for about 89yr. She is taking Fosamax 70 mg 1 tablet by mouth once weekly.  She continues to take a vitamin D supplement on a daily basis.  Vitamin D deficiency: She is taking vitamin D 5000 units by mouth daily.  Vitamin D was 37 on 05/23/2019.  We discussed that ideally her vitamin D level should be between 50 and 60 as recommended in SLE patients.  Trochanteric bursitis of left hip: She has ongoing discomfort due to trochanter bursitis of the left hip.  She has been seeing Dr. Latanya Maudlin and has been going to physical therapy for the past 6 weeks.  She has noticed an improvement in her discomfort and strength since starting PT.  She will be following up with Dr. Latanya Maudlin in June 2021.   De Quervain's tenosynovitis, bilateral: Resolved  Other medical conditions are listed as follows:  History of IBS  History of hypertension  Orders: Orders Placed This Encounter  Procedures  . CBC with Differential/Platelet  . Urinalysis, Routine w reflex microscopic  . COMPLETE METABOLIC PANEL WITH GFR  . Anti-DNA antibody, double-stranded  . C3 and C4  . Sedimentation rate   No orders of the defined types were placed in this encounter.   Face-to-face time spent with patient was 30 minutes. Greater than 50% of time was spent in counseling and coordination of care.  Follow-Up Instructions: Return in about  5 months (around 05/01/2020) for Systemic lupus erythematosus, Osteoporosis.   Ofilia Neas, PA-C  Note - This record has been created using Dragon software.  Chart creation errors have been sought, but may not always  have been located. Such creation errors do not reflect on  the standard of medical care.

## 2019-11-25 DIAGNOSIS — M6281 Muscle weakness (generalized): Secondary | ICD-10-CM | POA: Diagnosis not present

## 2019-11-25 DIAGNOSIS — M7062 Trochanteric bursitis, left hip: Secondary | ICD-10-CM | POA: Diagnosis not present

## 2019-11-25 DIAGNOSIS — M7632 Iliotibial band syndrome, left leg: Secondary | ICD-10-CM | POA: Diagnosis not present

## 2019-11-28 DIAGNOSIS — M7062 Trochanteric bursitis, left hip: Secondary | ICD-10-CM | POA: Diagnosis not present

## 2019-11-28 DIAGNOSIS — M7632 Iliotibial band syndrome, left leg: Secondary | ICD-10-CM | POA: Diagnosis not present

## 2019-11-28 DIAGNOSIS — M6281 Muscle weakness (generalized): Secondary | ICD-10-CM | POA: Diagnosis not present

## 2019-11-30 ENCOUNTER — Ambulatory Visit (INDEPENDENT_AMBULATORY_CARE_PROVIDER_SITE_OTHER): Payer: Medicare Other | Admitting: Physician Assistant

## 2019-11-30 ENCOUNTER — Ambulatory Visit: Payer: Medicare Other | Admitting: Physician Assistant

## 2019-11-30 ENCOUNTER — Encounter: Payer: Self-pay | Admitting: Physician Assistant

## 2019-11-30 ENCOUNTER — Other Ambulatory Visit: Payer: Self-pay

## 2019-11-30 VITALS — BP 125/76 | HR 64 | Resp 14 | Ht 64.5 in | Wt 138.6 lb

## 2019-11-30 DIAGNOSIS — I7300 Raynaud's syndrome without gangrene: Secondary | ICD-10-CM | POA: Diagnosis not present

## 2019-11-30 DIAGNOSIS — M7062 Trochanteric bursitis, left hip: Secondary | ICD-10-CM | POA: Diagnosis not present

## 2019-11-30 DIAGNOSIS — M654 Radial styloid tenosynovitis [de Quervain]: Secondary | ICD-10-CM | POA: Diagnosis not present

## 2019-11-30 DIAGNOSIS — E559 Vitamin D deficiency, unspecified: Secondary | ICD-10-CM

## 2019-11-30 DIAGNOSIS — M7632 Iliotibial band syndrome, left leg: Secondary | ICD-10-CM | POA: Diagnosis not present

## 2019-11-30 DIAGNOSIS — L930 Discoid lupus erythematosus: Secondary | ICD-10-CM

## 2019-11-30 DIAGNOSIS — Z8679 Personal history of other diseases of the circulatory system: Secondary | ICD-10-CM

## 2019-11-30 DIAGNOSIS — M810 Age-related osteoporosis without current pathological fracture: Secondary | ICD-10-CM | POA: Diagnosis not present

## 2019-11-30 DIAGNOSIS — L819 Disorder of pigmentation, unspecified: Secondary | ICD-10-CM

## 2019-11-30 DIAGNOSIS — Z79899 Other long term (current) drug therapy: Secondary | ICD-10-CM | POA: Diagnosis not present

## 2019-11-30 DIAGNOSIS — M3219 Other organ or system involvement in systemic lupus erythematosus: Secondary | ICD-10-CM | POA: Diagnosis not present

## 2019-11-30 DIAGNOSIS — Z8719 Personal history of other diseases of the digestive system: Secondary | ICD-10-CM

## 2019-11-30 DIAGNOSIS — M6281 Muscle weakness (generalized): Secondary | ICD-10-CM | POA: Diagnosis not present

## 2019-12-01 LAB — CBC WITH DIFFERENTIAL/PLATELET
Absolute Monocytes: 353 cells/uL (ref 200–950)
Basophils Absolute: 59 cells/uL (ref 0–200)
Basophils Relative: 1.4 %
Eosinophils Absolute: 193 cells/uL (ref 15–500)
Eosinophils Relative: 4.6 %
HCT: 42.6 % (ref 35.0–45.0)
Hemoglobin: 13.9 g/dL (ref 11.7–15.5)
Lymphs Abs: 1231 cells/uL (ref 850–3900)
MCH: 28.5 pg (ref 27.0–33.0)
MCHC: 32.6 g/dL (ref 32.0–36.0)
MCV: 87.3 fL (ref 80.0–100.0)
MPV: 10.5 fL (ref 7.5–12.5)
Monocytes Relative: 8.4 %
Neutro Abs: 2365 cells/uL (ref 1500–7800)
Neutrophils Relative %: 56.3 %
Platelets: 226 10*3/uL (ref 140–400)
RBC: 4.88 10*6/uL (ref 3.80–5.10)
RDW: 12.8 % (ref 11.0–15.0)
Total Lymphocyte: 29.3 %
WBC: 4.2 10*3/uL (ref 3.8–10.8)

## 2019-12-01 LAB — COMPLETE METABOLIC PANEL WITH GFR
AG Ratio: 1.9 (calc) (ref 1.0–2.5)
ALT: 25 U/L (ref 6–29)
AST: 17 U/L (ref 10–35)
Albumin: 4.5 g/dL (ref 3.6–5.1)
Alkaline phosphatase (APISO): 46 U/L (ref 37–153)
BUN: 11 mg/dL (ref 7–25)
CO2: 23 mmol/L (ref 20–32)
Calcium: 10 mg/dL (ref 8.6–10.4)
Chloride: 101 mmol/L (ref 98–110)
Creat: 0.89 mg/dL (ref 0.50–0.99)
GFR, Est African American: 77 mL/min/{1.73_m2} (ref >=60)
GFR, Est Non African American: 67 mL/min/{1.73_m2} (ref >=60)
Globulin: 2.4 g/dL (calc) (ref 1.9–3.7)
Glucose, Bld: 89 mg/dL (ref 65–99)
Potassium: 4.4 mmol/L (ref 3.5–5.3)
Sodium: 137 mmol/L (ref 135–146)
Total Bilirubin: 0.8 mg/dL (ref 0.2–1.2)
Total Protein: 6.9 g/dL (ref 6.1–8.1)

## 2019-12-01 LAB — URINALYSIS, ROUTINE W REFLEX MICROSCOPIC
Bacteria, UA: NONE SEEN /HPF
Bilirubin Urine: NEGATIVE
Glucose, UA: NEGATIVE
Hgb urine dipstick: NEGATIVE
Hyaline Cast: NONE SEEN /LPF
Ketones, ur: NEGATIVE
Nitrite: NEGATIVE
Protein, ur: NEGATIVE
RBC / HPF: NONE SEEN /HPF (ref 0–2)
Specific Gravity, Urine: 1.01 (ref 1.001–1.03)
Squamous Epithelial / HPF: NONE SEEN /HPF (ref <=5)
pH: 7 (ref 5.0–8.0)

## 2019-12-01 LAB — C3 AND C4
C3 Complement: 101 mg/dL (ref 83–193)
C4 Complement: 23 mg/dL (ref 15–57)

## 2019-12-01 LAB — SEDIMENTATION RATE: Sed Rate: 2 mm/h (ref 0–30)

## 2019-12-01 LAB — ANTI-DNA ANTIBODY, DOUBLE-STRANDED: ds DNA Ab: <1 IU/mL

## 2019-12-01 NOTE — Progress Notes (Signed)
CBC and CMP WNL.  Complements WNL.  ESR WNL.  UA revealed +2 leukocytes, negative for nitrites and bacteria.  Please advise the patient to follow up with PCP if she develops signs or symptoms of a UTI.

## 2019-12-01 NOTE — Progress Notes (Signed)
DsDNA is negative.

## 2019-12-02 ENCOUNTER — Telehealth: Payer: Self-pay | Admitting: Rheumatology

## 2019-12-02 NOTE — Telephone Encounter (Signed)
Patient left a message stating she was returning Theresa Graham's call in reference to her lab results. Patient would like for you to explain what a negative double strand DNA means? Please leave a detailed message if she does not answer.

## 2019-12-05 DIAGNOSIS — M7062 Trochanteric bursitis, left hip: Secondary | ICD-10-CM | POA: Diagnosis not present

## 2019-12-05 DIAGNOSIS — R399 Unspecified symptoms and signs involving the genitourinary system: Secondary | ICD-10-CM | POA: Diagnosis not present

## 2019-12-05 DIAGNOSIS — M7632 Iliotibial band syndrome, left leg: Secondary | ICD-10-CM | POA: Diagnosis not present

## 2019-12-05 DIAGNOSIS — M6281 Muscle weakness (generalized): Secondary | ICD-10-CM | POA: Diagnosis not present

## 2019-12-05 NOTE — Telephone Encounter (Signed)
Patient advised dsDNA is a test used to diagnosis Lupus along with clinical signs and symptoms. Patient advised per the doctor's lab note hers was negative and she has not been advised to make any changes at this time.

## 2019-12-07 DIAGNOSIS — M7062 Trochanteric bursitis, left hip: Secondary | ICD-10-CM | POA: Diagnosis not present

## 2019-12-07 DIAGNOSIS — M7632 Iliotibial band syndrome, left leg: Secondary | ICD-10-CM | POA: Diagnosis not present

## 2019-12-07 DIAGNOSIS — M6281 Muscle weakness (generalized): Secondary | ICD-10-CM | POA: Diagnosis not present

## 2019-12-14 DIAGNOSIS — M7062 Trochanteric bursitis, left hip: Secondary | ICD-10-CM | POA: Diagnosis not present

## 2019-12-14 DIAGNOSIS — M7632 Iliotibial band syndrome, left leg: Secondary | ICD-10-CM | POA: Diagnosis not present

## 2019-12-14 DIAGNOSIS — M6281 Muscle weakness (generalized): Secondary | ICD-10-CM | POA: Diagnosis not present

## 2019-12-16 DIAGNOSIS — M6281 Muscle weakness (generalized): Secondary | ICD-10-CM | POA: Diagnosis not present

## 2019-12-16 DIAGNOSIS — M7632 Iliotibial band syndrome, left leg: Secondary | ICD-10-CM | POA: Diagnosis not present

## 2019-12-16 DIAGNOSIS — M7062 Trochanteric bursitis, left hip: Secondary | ICD-10-CM | POA: Diagnosis not present

## 2019-12-20 DIAGNOSIS — M5136 Other intervertebral disc degeneration, lumbar region: Secondary | ICD-10-CM | POA: Insufficient documentation

## 2019-12-21 DIAGNOSIS — M7632 Iliotibial band syndrome, left leg: Secondary | ICD-10-CM | POA: Diagnosis not present

## 2019-12-21 DIAGNOSIS — M7062 Trochanteric bursitis, left hip: Secondary | ICD-10-CM | POA: Diagnosis not present

## 2019-12-21 DIAGNOSIS — M6281 Muscle weakness (generalized): Secondary | ICD-10-CM | POA: Diagnosis not present

## 2019-12-23 DIAGNOSIS — M6281 Muscle weakness (generalized): Secondary | ICD-10-CM | POA: Diagnosis not present

## 2019-12-23 DIAGNOSIS — M7062 Trochanteric bursitis, left hip: Secondary | ICD-10-CM | POA: Diagnosis not present

## 2019-12-23 DIAGNOSIS — M7632 Iliotibial band syndrome, left leg: Secondary | ICD-10-CM | POA: Diagnosis not present

## 2019-12-26 DIAGNOSIS — M7062 Trochanteric bursitis, left hip: Secondary | ICD-10-CM | POA: Diagnosis not present

## 2019-12-28 ENCOUNTER — Telehealth: Payer: Self-pay | Admitting: Rheumatology

## 2019-12-28 NOTE — Telephone Encounter (Signed)
Results faxed as requested

## 2019-12-28 NOTE — Telephone Encounter (Signed)
Patient called requesting a copy of her labwork results from 11/30/19 be faxed to Dr. Rhona Raider at [facility].  Fax [phone]

## 2020-01-02 DIAGNOSIS — I10 Essential (primary) hypertension: Secondary | ICD-10-CM | POA: Diagnosis not present

## 2020-01-02 DIAGNOSIS — R7309 Other abnormal glucose: Secondary | ICD-10-CM | POA: Diagnosis not present

## 2020-01-02 DIAGNOSIS — E7800 Pure hypercholesterolemia, unspecified: Secondary | ICD-10-CM | POA: Diagnosis not present

## 2020-01-20 ENCOUNTER — Telehealth: Payer: Self-pay | Admitting: Rheumatology

## 2020-01-20 NOTE — Telephone Encounter (Signed)
Noted and will send my chart message to the providers for review when it comes in.

## 2020-01-20 NOTE — Telephone Encounter (Signed)
FYI:  Patient called stating she has been attending physical therapy for the past 3 months and has questions regarding ongoing pain and if it is related to Lupus. Patient states she will send a message through Mychart so she can go into detail.

## 2020-02-06 DIAGNOSIS — M7062 Trochanteric bursitis, left hip: Secondary | ICD-10-CM | POA: Diagnosis not present

## 2020-02-16 DIAGNOSIS — M79662 Pain in left lower leg: Secondary | ICD-10-CM | POA: Diagnosis not present

## 2020-03-05 DIAGNOSIS — M545 Low back pain: Secondary | ICD-10-CM | POA: Diagnosis not present

## 2020-03-07 DIAGNOSIS — S335XXD Sprain of ligaments of lumbar spine, subsequent encounter: Secondary | ICD-10-CM | POA: Diagnosis not present

## 2020-03-13 DIAGNOSIS — S335XXD Sprain of ligaments of lumbar spine, subsequent encounter: Secondary | ICD-10-CM | POA: Diagnosis not present

## 2020-03-15 DIAGNOSIS — S335XXD Sprain of ligaments of lumbar spine, subsequent encounter: Secondary | ICD-10-CM | POA: Diagnosis not present

## 2020-03-16 DIAGNOSIS — Z20822 Contact with and (suspected) exposure to covid-19: Secondary | ICD-10-CM | POA: Diagnosis not present

## 2020-03-19 DIAGNOSIS — S335XXD Sprain of ligaments of lumbar spine, subsequent encounter: Secondary | ICD-10-CM | POA: Diagnosis not present

## 2020-03-22 DIAGNOSIS — S335XXD Sprain of ligaments of lumbar spine, subsequent encounter: Secondary | ICD-10-CM | POA: Diagnosis not present

## 2020-03-23 DIAGNOSIS — Z23 Encounter for immunization: Secondary | ICD-10-CM | POA: Diagnosis not present

## 2020-03-28 DIAGNOSIS — S335XXD Sprain of ligaments of lumbar spine, subsequent encounter: Secondary | ICD-10-CM | POA: Diagnosis not present

## 2020-03-30 DIAGNOSIS — S335XXD Sprain of ligaments of lumbar spine, subsequent encounter: Secondary | ICD-10-CM | POA: Diagnosis not present

## 2020-04-11 DIAGNOSIS — H903 Sensorineural hearing loss, bilateral: Secondary | ICD-10-CM | POA: Diagnosis not present

## 2020-04-11 DIAGNOSIS — H6123 Impacted cerumen, bilateral: Secondary | ICD-10-CM | POA: Diagnosis not present

## 2020-04-11 DIAGNOSIS — J310 Chronic rhinitis: Secondary | ICD-10-CM | POA: Diagnosis not present

## 2020-04-11 DIAGNOSIS — H9313 Tinnitus, bilateral: Secondary | ICD-10-CM | POA: Diagnosis not present

## 2020-04-13 DIAGNOSIS — M545 Low back pain: Secondary | ICD-10-CM | POA: Diagnosis not present

## 2020-04-18 DIAGNOSIS — M545 Low back pain: Secondary | ICD-10-CM | POA: Diagnosis not present

## 2020-04-18 DIAGNOSIS — M5442 Lumbago with sciatica, left side: Secondary | ICD-10-CM | POA: Diagnosis not present

## 2020-04-18 DIAGNOSIS — L821 Other seborrheic keratosis: Secondary | ICD-10-CM | POA: Diagnosis not present

## 2020-04-18 DIAGNOSIS — L819 Disorder of pigmentation, unspecified: Secondary | ICD-10-CM | POA: Diagnosis not present

## 2020-04-18 DIAGNOSIS — L649 Androgenic alopecia, unspecified: Secondary | ICD-10-CM | POA: Diagnosis not present

## 2020-04-18 DIAGNOSIS — Z85828 Personal history of other malignant neoplasm of skin: Secondary | ICD-10-CM | POA: Diagnosis not present

## 2020-04-18 DIAGNOSIS — H02729 Madarosis of unspecified eye, unspecified eyelid and periocular area: Secondary | ICD-10-CM | POA: Diagnosis not present

## 2020-04-18 DIAGNOSIS — D225 Melanocytic nevi of trunk: Secondary | ICD-10-CM | POA: Diagnosis not present

## 2020-04-18 DIAGNOSIS — D2271 Melanocytic nevi of right lower limb, including hip: Secondary | ICD-10-CM | POA: Diagnosis not present

## 2020-04-19 DIAGNOSIS — S335XXD Sprain of ligaments of lumbar spine, subsequent encounter: Secondary | ICD-10-CM | POA: Diagnosis not present

## 2020-04-19 NOTE — Progress Notes (Deleted)
Office Visit Note  Patient: Theresa Graham             Date of Birth: Jul 03, 1951           MRN: [id number]             PCP: Carol Ada, MD Referring: Carol Ada, MD Visit Date: 05/02/2020 Occupation: @GUAROCC @  Subjective:  No chief complaint on file.   History of Present Illness: Theresa Graham is a 69 y.o. female ***   Activities of Daily Living:  Patient reports morning stiffness for *** {minute/hour:19697}.   Patient {ACTIONS;DENIES/REPORTS:21021675::"Denies"} nocturnal pain.  Difficulty dressing/grooming: {ACTIONS;DENIES/REPORTS:21021675::"Denies"} Difficulty climbing stairs: {ACTIONS;DENIES/REPORTS:21021675::"Denies"} Difficulty getting out of chair: {ACTIONS;DENIES/REPORTS:21021675::"Denies"} Difficulty using hands for taps, buttons, cutlery, and/or writing: {ACTIONS;DENIES/REPORTS:21021675::"Denies"}  No Rheumatology ROS completed.   PMFS History:  Patient Active Problem List   Diagnosis Date Noted  . Trochanteric bursitis of left hip 01/08/2017  . 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 Age-related osteoporosis without current pathological fracture 08/04/2016  . Systemic lupus erythematosus ([place]) 08/01/2016  . Vitamin D deficiency 08/01/2016  . History of IBS 08/01/2016  . Plantar fasciitis 08/01/2016  . Raynaud's disease without gangrene 08/01/2016  . History of hypertension 08/01/2016  . High risk medication use 06/18/2016  . Hypertension   . HSV (herpes simplex virus) infection   . [id number](211.9)     Past Medical History:  Diagnosis Date  . HSV (herpes simplex virus) infection   . Hypertension   . Lupus (HCC)    sle and discoid lupus  . Osteoporosis 2019   T score -2.8 statistically improved from prior study    Family History  Problem Relation Age of Onset  . Diabetes Mother   . Hypertension Mother   . Thyroid disease Mother   . Heart disease Mother   . Breast cancer Mother        Age 59  . Diabetes Father   . Hypertension Father   . Cancer  Father        COLON  . Heart disease Father   . Crohn's disease Sister   . Hypertension Sister   . Stroke Sister   . Cancer Sister        Intestinal Ca  . Hypertension Brother   . Colitis Sister   . Diabetes Maternal Grandmother   . Diabetes Paternal Grandmother   . Diabetes Paternal Grandfather   . Sarcoidosis Sister   . Cancer Sister   . Crohn's disease Sister   . Healthy Son   . Rheum arthritis Son    Past Surgical History:  Procedure Laterality Date  . ABDOMINAL HYSTERECTOMY  1987   TAH  . FOOT SURGERY    . ROTATOR CUFF REPAIR  2006  . SHOULDER SURGERY FOR BONE SPUR  2010  . STRESS FRACTURE LEG  08/2007  . TENDON RELEASE Left 01/07/2018  . TUBAL LIGATION  1978  . WRIST SURGERY Right 09/23/2018   tendon release    Social History   Social History Narrative  . Not on file   Immunization History  Administered Date(s) Administered  . Influenza Split 05/05/2012  . Influenza,inj,Quad PF,6+ Mos 05/13/2013, 05/17/2014     Objective: Vital Signs: There were no vitals taken for this visit.   Physical Exam   Musculoskeletal Exam: ***  CDAI Exam: CDAI Score: -- Patient Global: --; Provider Global: -- Swollen: --; Tender: -- Joint Exam 05/02/2020   No joint exam has been documented for this visit   There is currently no  information documented on the homunculus. Go to the Rheumatology activity and complete the homunculus joint exam.  Investigation: No additional findings.  Imaging: No results found.  Recent Labs: Lab Results  Component Value Date   WBC 4.2 11/30/2019   HGB 13.9 11/30/2019   PLT 226 11/30/2019   NA 137 11/30/2019   K 4.4 11/30/2019   CL 101 11/30/2019   CO2 23 11/30/2019   GLUCOSE 89 11/30/2019   BUN 11 11/30/2019   CREATININE 0.89 11/30/2019   BILITOT 0.8 11/30/2019   ALKPHOS 69 08/04/2016   AST 17 11/30/2019   ALT 25 11/30/2019   PROT 6.9 11/30/2019   ALBUMIN 4.3 08/04/2016   CALCIUM 10.0 11/30/2019   GFRAA 77 11/30/2019     Speciality Comments: PLQ Eye Exam: 06/08/2019 WNL @ [facility] follow up in 1 year  Procedures:  No procedures performed Allergies: Patient has no known allergies.   Assessment / Plan:     Visit Diagnoses: No diagnosis found.  Orders: No orders of the defined types were placed in this encounter.  No orders of the defined types were placed in this encounter.   Face-to-face time spent with patient was *** minutes. Greater than 50% of time was spent in counseling and coordination of care.  Follow-Up Instructions: No follow-ups on file.   Earnestine Mealing, CMA  Note - This record has been created using Editor, commissioning.  Chart creation errors have been sought, but may not always  have been located. Such creation errors do not reflect on  the standard of medical care.

## 2020-04-27 DIAGNOSIS — Z23 Encounter for immunization: Secondary | ICD-10-CM | POA: Diagnosis not present

## 2020-04-30 DIAGNOSIS — M5416 Radiculopathy, lumbar region: Secondary | ICD-10-CM | POA: Diagnosis not present

## 2020-05-02 ENCOUNTER — Ambulatory Visit: Payer: Medicare Other | Admitting: Physician Assistant

## 2020-05-17 NOTE — Progress Notes (Signed)
Office Visit Note  Patient: Theresa Graham             Date of Birth: 07-Jun-1951           MRN: [id number]             PCP: Carol Ada, MD Referring: Carol Ada, MD Visit Date: 05/31/2020 Occupation: @GUAROCC @  Subjective:  Lower back pain and Raynauds.   History of Present Illness: Theresa Graham is a 69 y.o. female with history of systemic and discoid lupus.  She states she has been having left-sided hip pain for a while.  She was seen at [facility] where she was going through physical therapy without much improvement in her left-sided hip pain.  She also had left calf ultrasound to rule out DVT which was negative.  Her lower back pain persist and she had MRI of her lumbar spine which was consistent with degenerative disc disease and spinal stenosis.  She states she had cortisone injection to her lumbar spine 1 week ago and she has had minimal improvement so far.  She denies any history of oral ulcers, nasal ulcers or recent rash.  She continues to have Raynaud's phenomenon.  She is getting DEXA through her GYN.  She is on Fosamax currently.  Activities of Daily Living:  Patient reports morning stiffness for 5 minutes.   Patient Denies nocturnal pain.  Difficulty dressing/grooming: Denies Difficulty climbing stairs: Denies Difficulty getting out of chair: Denies Difficulty using hands for taps, buttons, cutlery, and/or writing: Denies  Review of Systems  Constitutional: Negative for fatigue.  HENT: Negative for mouth sores, mouth dryness and nose dryness.   Eyes: Negative for pain, itching and dryness.  Respiratory: Negative for shortness of breath and difficulty breathing.   Cardiovascular: Negative for chest pain and palpitations.  Gastrointestinal: Positive for constipation. Negative for blood in stool and diarrhea.  Endocrine: Negative for increased urination.  Genitourinary: Negative for difficulty urinating.  Musculoskeletal: Positive for arthralgias, joint  pain and morning stiffness. Negative for joint swelling, myalgias, muscle tenderness and myalgias.  Skin: Positive for color change. Negative for rash and redness.  Allergic/Immunologic: Negative for susceptible to infections.  Neurological: Positive for numbness. Negative for dizziness, headaches, memory loss and weakness.  Hematological: Negative for bruising/bleeding tendency.  Psychiatric/Behavioral: Positive for sleep disturbance. Negative for confusion.    PMFS History:  Patient Active Problem List   Diagnosis Date Noted  . Trochanteric bursitis of left hip 01/08/2017  . 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 Age-related osteoporosis without current pathological fracture 08/04/2016  . Systemic lupus erythematosus ([place]) 08/01/2016  . Vitamin D deficiency 08/01/2016  . History of IBS 08/01/2016  . Plantar fasciitis 08/01/2016  . Raynaud's disease without gangrene 08/01/2016  . History of hypertension 08/01/2016  . High risk medication use 06/18/2016  . Hypertension   . HSV (herpes simplex virus) infection   . VFIEPPIR(518.8)     Past Medical History:  Diagnosis Date  . HSV (herpes simplex virus) infection   . Hypertension   . Lupus (HCC)    sle and discoid lupus  . Osteoporosis 2019   T score -2.8 statistically improved from prior study  . Spinal stenosis    per patient     Family History  Problem Relation Age of Onset  . Diabetes Mother   . Hypertension Mother   . Thyroid disease Mother   . Heart disease Mother   . Breast cancer Mother        Age  2  . Diabetes Father   . Hypertension Father   . Cancer Father        COLON  . Heart disease Father   . Crohn's disease Sister   . Hypertension Sister   . Stroke Sister   . Cancer Sister        Intestinal Ca  . Hypertension Brother   . Colitis Sister   . Diabetes Maternal Grandmother   . Diabetes Paternal Grandmother   . Diabetes Paternal Grandfather   . Sarcoidosis Sister   . Cancer Sister   . Crohn's disease Sister     . Healthy Son   . Rheum arthritis Son    Past Surgical History:  Procedure Laterality Date  . ABDOMINAL HYSTERECTOMY  1987   TAH  . back injection  05/2020  . FOOT SURGERY    . ROTATOR CUFF REPAIR  2006  . SHOULDER SURGERY FOR BONE SPUR  2010  . STRESS FRACTURE LEG  08/2007  . TENDON RELEASE Left 01/07/2018  . TUBAL LIGATION  1978  . WRIST SURGERY Right 09/23/2018   tendon release    Social History   Social History Narrative  . Not on file   Immunization History  Administered Date(s) Administered  . Influenza Split 05/05/2012  . Influenza,inj,Quad PF,6+ Mos 05/13/2013, 05/17/2014  . PFIZER SARS-COV-2 Vaccination 08/16/2019, 09/06/2019, 03/23/2020     Objective: Vital Signs: BP 129/71 (BP Location: Left Arm, Patient Position: Sitting, Cuff Size: Normal)   Pulse 70   Resp 14   Ht 5' 4.5" (1.638 m)   Wt 146 lb 3.2 oz (66.3 kg)   BMI 24.71 kg/m    Physical Exam Vitals and nursing note reviewed.  Constitutional:      Appearance: She is well-developed.  HENT:     Head: Normocephalic and atraumatic.  Eyes:     Conjunctiva/sclera: Conjunctivae normal.  Cardiovascular:     Rate and Rhythm: Normal rate and regular rhythm.     Heart sounds: Normal heart sounds.  Pulmonary:     Effort: Pulmonary effort is normal.     Breath sounds: Normal breath sounds.  Abdominal:     General: Bowel sounds are normal.     Palpations: Abdomen is soft.  Musculoskeletal:     Cervical back: Normal range of motion.  Lymphadenopathy:     Cervical: No cervical adenopathy.  Skin:    General: Skin is warm and dry.     Capillary Refill: Capillary refill takes less than 2 seconds.  Neurological:     Mental Status: She is alert and oriented to person, place, and time.  Psychiatric:        Behavior: Behavior normal.      Musculoskeletal Exam: C-spine was in good range of motion.  She discomfort range of motion for lumbar spine.  Shoulder joints, elbow joints, wrist joints, MCPs PIPs and  DIPs with good range of motion.  She has synovial thickening over MCP joint and mild ulnar deviation.  Hip joints, knee joints, ankles are in good range of motion.  She has limited extension of her right knee joint.  No tenderness over ankle joints or MTPs was noted.  CDAI Exam: CDAI Score: -- Patient Global: --; Provider Global: -- Swollen: --; Tender: -- Joint Exam 05/31/2020   No joint exam has been documented for this visit   There is currently no information documented on the homunculus. Go to the Rheumatology activity and complete the homunculus joint exam.  Investigation: No additional findings.  Imaging: No results found.  Recent Labs: Lab Results  Component Value Date   WBC 4.2 11/30/2019   HGB 13.9 11/30/2019   PLT 226 11/30/2019   NA 137 11/30/2019   K 4.4 11/30/2019   CL 101 11/30/2019   CO2 23 11/30/2019   GLUCOSE 89 11/30/2019   BUN 11 11/30/2019   CREATININE 0.89 11/30/2019   BILITOT 0.8 11/30/2019   ALKPHOS 69 08/04/2016   AST 17 11/30/2019   ALT 25 11/30/2019   PROT 6.9 11/30/2019   ALBUMIN 4.3 08/04/2016   CALCIUM 10.0 11/30/2019   GFRAA 77 11/30/2019    Speciality Comments: PLQ Eye Exam: 06/08/2019 WNL @ [facility] follow up in 1 year  Procedures:  No procedures performed Allergies: Patient has no known allergies.   Assessment / Plan:     Visit Diagnoses: Other systemic lupus erythematosus with other organ involvement (HCC) - Positive ANA, positive Smith, positive RNP, positive Ro, positive Raynauds, positive photosensitivity, positive arthritis, DLE: -She is clinically doing well on Plaquenil.  She had no recent rash.  She continues to have Raynaud's.  She denies any history of inflammatory arthritis or sicca symptoms.  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  High risk  medication use - Plaquenil 200 mg 1 tablet every other day.PLQ Eye Exam: 06/08/2019 - Plan: CBC with Differential/Platelet, COMPLETE METABOLIC PANEL WITH GFR today and then every 5 months to monitor for drug toxicity.  Hypopigmentation-due to previous rash.  Raynaud's syndrome without gangrene-she continues to have Raynaud's symptoms.  Warm clothing and keeping core temperature warm was discussed.  She has no capillary changes or digital ulcers on examination.  Age-related osteoporosis without current pathological fracture - DEXA 05/26/2018 T score -2.8 lumbar spine, BMD 0.845 +8% change compared to Nov 2017. Restarted fosamax 08/04/16, previously on it for about 66yr.  She is getting DEXA through her PCP.  I have advised her to bring results at the follow-up visit.  Vitamin D deficiency -she gives history of vitamin D deficiency.  She is on bisphosphonates.  I will check vitamin D level today.  Plan: VITAMIN D 25 Hydroxy (Vit-D Deficiency, Fractures)  DDD (degenerative disc disease), lumbar - With a spinal stenosis.  She was recently diagnosed with a spinal stenosis and had cortisone injection.  She continues to have some discomfort and left-sided radiculopathy.  Trochanteric bursitis of left hip-improved.  De Quervain's tenosynovitis, bilateral-it is currently not bothering her.  History of hypertension-her blood pressure is normal.  History of IBS  Educated about COVID-19 virus infection-she is fully vaccinated against COVID-19 and also received a booster.  Use of mask, social distancing and hand hygiene was discussed.  Use of monoclonal antibodies were also discussed in case she develops COVID-19 infection.  Orders: Orders Placed This Encounter  Procedures  . CBC with Differential/Platelet  . COMPLETE METABOLIC PANEL WITH GFR  . Urinalysis, Routine w reflex microscopic  . Anti-DNA antibody, double-stranded  . C3 and C4  . Sedimentation rate  . VITAMIN D 25 Hydroxy (Vit-D Deficiency,  Fractures)   No orders of the defined types were placed in this encounter.     Follow-Up Instructions: Return in about 5 months (around 10/29/2020) for Systemic lupus.   Bo Merino, MD  Note - This record has been created using Editor, commissioning.  Chart creation errors have been sought, but may not always  have been located.  Such creation errors do not reflect on  the standard of medical care.

## 2020-05-21 HISTORY — PX: OTHER SURGICAL HISTORY: SHX169

## 2020-05-24 DIAGNOSIS — M5416 Radiculopathy, lumbar region: Secondary | ICD-10-CM | POA: Diagnosis not present

## 2020-05-31 ENCOUNTER — Encounter: Payer: Self-pay | Admitting: Rheumatology

## 2020-05-31 ENCOUNTER — Ambulatory Visit (INDEPENDENT_AMBULATORY_CARE_PROVIDER_SITE_OTHER): Payer: Medicare Other | Admitting: Rheumatology

## 2020-05-31 ENCOUNTER — Other Ambulatory Visit: Payer: Self-pay

## 2020-05-31 VITALS — BP 129/71 | HR 70 | Resp 14 | Ht 64.5 in | Wt 146.2 lb

## 2020-05-31 DIAGNOSIS — M3219 Other organ or system involvement in systemic lupus erythematosus: Secondary | ICD-10-CM | POA: Diagnosis not present

## 2020-05-31 DIAGNOSIS — Z8679 Personal history of other diseases of the circulatory system: Secondary | ICD-10-CM

## 2020-05-31 DIAGNOSIS — Z7189 Other specified counseling: Secondary | ICD-10-CM

## 2020-05-31 DIAGNOSIS — M654 Radial styloid tenosynovitis [de Quervain]: Secondary | ICD-10-CM

## 2020-05-31 DIAGNOSIS — L819 Disorder of pigmentation, unspecified: Secondary | ICD-10-CM | POA: Diagnosis not present

## 2020-05-31 DIAGNOSIS — E559 Vitamin D deficiency, unspecified: Secondary | ICD-10-CM | POA: Diagnosis not present

## 2020-05-31 DIAGNOSIS — M810 Age-related osteoporosis without current pathological fracture: Secondary | ICD-10-CM | POA: Diagnosis not present

## 2020-05-31 DIAGNOSIS — L930 Discoid lupus erythematosus: Secondary | ICD-10-CM

## 2020-05-31 DIAGNOSIS — Z8719 Personal history of other diseases of the digestive system: Secondary | ICD-10-CM | POA: Diagnosis not present

## 2020-05-31 DIAGNOSIS — Z79899 Other long term (current) drug therapy: Secondary | ICD-10-CM | POA: Diagnosis not present

## 2020-05-31 DIAGNOSIS — M7062 Trochanteric bursitis, left hip: Secondary | ICD-10-CM | POA: Diagnosis not present

## 2020-05-31 DIAGNOSIS — M5136 Other intervertebral disc degeneration, lumbar region: Secondary | ICD-10-CM | POA: Diagnosis not present

## 2020-05-31 DIAGNOSIS — I7300 Raynaud's syndrome without gangrene: Secondary | ICD-10-CM

## 2020-05-31 NOTE — Patient Instructions (Signed)

## 2020-06-01 ENCOUNTER — Telehealth: Payer: Self-pay

## 2020-06-01 DIAGNOSIS — Z1231 Encounter for screening mammogram for malignant neoplasm of breast: Secondary | ICD-10-CM | POA: Diagnosis not present

## 2020-06-01 LAB — COMPLETE METABOLIC PANEL WITH GFR
AG Ratio: 1.9 (calc) (ref 1.0–2.5)
ALT: 17 U/L (ref 6–29)
AST: 11 U/L (ref 10–35)
Albumin: 4.7 g/dL (ref 3.6–5.1)
Alkaline phosphatase (APISO): 52 U/L (ref 37–153)
BUN: 19 mg/dL (ref 7–25)
CO2: 25 mmol/L (ref 20–32)
Calcium: 10 mg/dL (ref 8.6–10.4)
Chloride: 100 mmol/L (ref 98–110)
Creat: 0.96 mg/dL (ref 0.50–0.99)
GFR, Est African American: 70 mL/min/{1.73_m2} (ref >=60)
GFR, Est Non African American: 60 mL/min/{1.73_m2} (ref >=60)
Globulin: 2.5 g/dL (calc) (ref 1.9–3.7)
Glucose, Bld: 101 mg/dL (ref 65–139)
Potassium: 4 mmol/L (ref 3.5–5.3)
Sodium: 137 mmol/L (ref 135–146)
Total Bilirubin: 0.9 mg/dL (ref 0.2–1.2)
Total Protein: 7.2 g/dL (ref 6.1–8.1)

## 2020-06-01 LAB — CBC WITH DIFFERENTIAL/PLATELET
Absolute Monocytes: 657 cells/uL (ref 200–950)
Basophils Absolute: 58 cells/uL (ref 0–200)
Basophils Relative: 1.1 %
Eosinophils Absolute: 445 cells/uL (ref 15–500)
Eosinophils Relative: 8.4 %
HCT: 42.9 % (ref 35.0–45.0)
Hemoglobin: 14.4 g/dL (ref 11.7–15.5)
Lymphs Abs: 1081 cells/uL (ref 850–3900)
MCH: 28.7 pg (ref 27.0–33.0)
MCHC: 33.6 g/dL (ref 32.0–36.0)
MCV: 85.6 fL (ref 80.0–100.0)
MPV: 10.4 fL (ref 7.5–12.5)
Monocytes Relative: 12.4 %
Neutro Abs: 3058 cells/uL (ref 1500–7800)
Neutrophils Relative %: 57.7 %
Platelets: 290 10*3/uL (ref 140–400)
RBC: 5.01 10*6/uL (ref 3.80–5.10)
RDW: 13.1 % (ref 11.0–15.0)
Total Lymphocyte: 20.4 %
WBC: 5.3 10*3/uL (ref 3.8–10.8)

## 2020-06-01 LAB — ANTI-DNA ANTIBODY, DOUBLE-STRANDED: ds DNA Ab: <1 IU/mL

## 2020-06-01 LAB — SEDIMENTATION RATE: Sed Rate: 2 mm/h (ref 0–30)

## 2020-06-01 LAB — URINALYSIS, ROUTINE W REFLEX MICROSCOPIC
Bacteria, UA: NONE SEEN /HPF
Bilirubin Urine: NEGATIVE
Glucose, UA: NEGATIVE
Hgb urine dipstick: NEGATIVE
Hyaline Cast: NONE SEEN /LPF
Ketones, ur: NEGATIVE
Nitrite: NEGATIVE
Protein, ur: NEGATIVE
RBC / HPF: NONE SEEN /HPF (ref 0–2)
Specific Gravity, Urine: 1.016 (ref 1.001–1.03)
Squamous Epithelial / HPF: NONE SEEN /HPF (ref <=5)
pH: 6.5 (ref 5.0–8.0)

## 2020-06-01 LAB — C3 AND C4
C3 Complement: 134 mg/dL (ref 83–193)
C4 Complement: 28 mg/dL (ref 15–57)

## 2020-06-01 LAB — VITAMIN D 25 HYDROXY (VIT D DEFICIENCY, FRACTURES): Vit D, 25-Hydroxy: 41 ng/mL (ref 30–100)

## 2020-06-01 NOTE — Telephone Encounter (Signed)
Former TF patient who plans to see JK.  She asked me to send Bone Density order to [facility] as it is time for her to have test.  Last one was 05/2018 and it did say return in two years. Just need okay to order. thanks

## 2020-06-01 NOTE — Telephone Encounter (Signed)
Yes please

## 2020-06-01 NOTE — Telephone Encounter (Signed)
Order faxed. Patient notified.

## 2020-06-03 NOTE — Progress Notes (Signed)
All the labs are within normal limits.

## 2020-06-18 DIAGNOSIS — H5213 Myopia, bilateral: Secondary | ICD-10-CM | POA: Diagnosis not present

## 2020-06-18 DIAGNOSIS — Z79899 Other long term (current) drug therapy: Secondary | ICD-10-CM | POA: Diagnosis not present

## 2020-06-18 DIAGNOSIS — H25013 Cortical age-related cataract, bilateral: Secondary | ICD-10-CM | POA: Diagnosis not present

## 2020-06-25 DIAGNOSIS — M5416 Radiculopathy, lumbar region: Secondary | ICD-10-CM | POA: Diagnosis not present

## 2020-06-25 DIAGNOSIS — M47816 Spondylosis without myelopathy or radiculopathy, lumbar region: Secondary | ICD-10-CM | POA: Diagnosis not present

## 2020-07-09 DIAGNOSIS — L602 Onychogryphosis: Secondary | ICD-10-CM | POA: Diagnosis not present

## 2020-07-09 DIAGNOSIS — M792 Neuralgia and neuritis, unspecified: Secondary | ICD-10-CM | POA: Diagnosis not present

## 2020-07-09 DIAGNOSIS — D2371 Other benign neoplasm of skin of right lower limb, including hip: Secondary | ICD-10-CM | POA: Diagnosis not present

## 2020-07-09 DIAGNOSIS — M7671 Peroneal tendinitis, right leg: Secondary | ICD-10-CM | POA: Diagnosis not present

## 2020-07-25 ENCOUNTER — Encounter: Payer: Medicare Other | Admitting: Obstetrics and Gynecology

## 2020-08-01 ENCOUNTER — Encounter: Payer: Self-pay | Admitting: Obstetrics and Gynecology

## 2020-08-01 DIAGNOSIS — M8588 Other specified disorders of bone density and structure, other site: Secondary | ICD-10-CM | POA: Diagnosis not present

## 2020-08-01 DIAGNOSIS — Z780 Asymptomatic menopausal state: Secondary | ICD-10-CM | POA: Diagnosis not present

## 2020-08-20 ENCOUNTER — Ambulatory Visit: Payer: Medicare Other | Admitting: Obstetrics and Gynecology

## 2020-08-20 ENCOUNTER — Encounter: Payer: Self-pay | Admitting: Obstetrics and Gynecology

## 2020-08-22 NOTE — Progress Notes (Signed)
GYNECOLOGY  VISIT   HPI: 70 y.o.   Widowed  [place]  female   G1P1001 with No LMP recorded. Patient has had a hysterectomy.   here for Breast and Pelvic exam.   Also followed for HSV, osteoporosis, and right hydrosalpinx.  Last [place] in 2017.  Denies pain.   Patient wants to know if needs a pelvic ultrasound.  Had her bone density at [facility] in January.  She has osteoporosis and on Fosamax for a second time.  Is currently on for 2 years and hopes to discontinue.  No problems with taking Fosamax.   Mammogram at the end of November, beginning of December.   Lupus is under control. Sees Dr. Nelson Chimes.   Working at [employer] currently.  Stays active and [facility] [employer]  GYNECOLOGIC HISTORY: No LMP recorded. Patient has had a hysterectomy. Contraception:  Tubal/Hyst Menopausal hormone therapy:  none Last mammogram: 05/2020 normal with [facility]--call with results. Last pap smear: 07-09-09 Neg, 07-04-15 Neg, 05-17-14 Neg        OB History    Gravida  1   Para  1   Term  1   Preterm      AB      Living  1     SAB      IAB      Ectopic      Multiple      Live Births                 Patient Active Problem List   Diagnosis Date Noted   Trochanteric bursitis of left hip 01/08/2017   Discoid lupus 01/08/2017   Age-related osteoporosis without current pathological fracture 08/04/2016   Systemic lupus erythematosus ([place]) 08/01/2016   Vitamin D deficiency 08/01/2016   History of IBS 08/01/2016   Plantar fasciitis 08/01/2016   Raynaud's disease without gangrene 08/01/2016   History of hypertension 08/01/2016   High risk medication use 06/18/2016   Hypertension    HSV (herpes simplex virus) infection    Headache(784.0)     Past Medical History:  Diagnosis Date   HSV (herpes simplex virus) infection    Hydrosalpinx    Hypertension    Lupus ([place])    sle and discoid lupus   Osteoporosis 2019   T score -2.8 statistically improved from  prior study   Spinal stenosis    per patient     Past Surgical History:  Procedure Laterality Date   ABDOMINAL HYSTERECTOMY  1987   TAH   back injection  05/2020   FOOT SURGERY     ROTATOR CUFF REPAIR  2006   SHOULDER SURGERY FOR BONE SPUR  2010   STRESS FRACTURE LEG  08/2007   TENDON RELEASE Left 01/07/2018   TUBAL LIGATION  1978   WRIST SURGERY Right 09/23/2018   tendon release     Current Outpatient Medications  Medication Sig Dispense Refill   alendronate (FOSAMAX) 70 MG tablet TAKE 1 TABLET BY MOUTH ONCE A WEEK. TAKE WITH A FULL  GLASS OF WATER ON AN EMPTY  STOMACH. 12 tablet 4   amLODipine (NORVASC) 5 MG tablet Take 5 mg by mouth daily.       cetirizine (ZYRTEC) 10 MG tablet Take 1 tablet by mouth as needed.     Cholecalciferol (VITAMIN D PO) Take 5,000 Units by mouth.     diclofenac sodium (VOLTAREN) 1 % GEL Apply 3 g topically 3 (three) times daily as needed.  hydroxychloroquine (PLAQUENIL) 200 MG tablet TAKE 1 TABLET BY MOUTH  EVERY OTHER DAY 45 tablet 0   linaclotide (LINZESS) 290 MCG CAPS capsule Take by mouth daily before breakfast.      magnesium oxide (MAG-OX) 400 MG tablet Take 400 mg by mouth daily.     Melatonin 10 MG CAPS      montelukast (SINGULAIR) 10 MG tablet Take 10 mg by mouth as needed.     Multiple Vitamin (MULTIVITAMIN) capsule Take 1 capsule by mouth daily.       Probiotic Product (PROBIOTIC-10 PO) Take 1 tablet by mouth daily.     triamcinolone cream (KENALOG) 0.1 % APP AA ON THE SKIN D PRN  0   triamterene-hydrochlorothiazide (MAXZIDE-25) 37.5-25 MG tablet Take 1 tablet by mouth daily.     valACYclovir (VALTREX) 500 MG tablet Take 500 mg by mouth 2 (two) times daily. Take for 3 days as needed.     No current facility-administered medications for this visit.     ALLERGIES: Patient has no known allergies.  Family History  Problem Relation Age of Onset   Diabetes Mother    Hypertension Mother    Thyroid disease  Mother    Heart disease Mother    Breast cancer Mother        Age 95   Diabetes Father    Hypertension Father    Cancer Father        COLON   Heart disease Father    Crohn's disease Sister    Hypertension Sister    Stroke Sister    Cancer Sister        Intestinal Ca   Hypertension Brother    Colitis Sister    Diabetes Maternal Grandmother    Diabetes Paternal Grandmother    Diabetes Paternal Grandfather    Sarcoidosis Sister    Cancer Sister    Crohn's disease Sister    Healthy Son    Rheum arthritis Son     Social History   Socioeconomic History   Marital status: Widowed    Spouse name: Not on file   Number of children: Not on file   Years of education: Not on file   Highest education level: Not on file  Occupational History   Not on file  Tobacco Use   Smoking status: Never Smoker   Smokeless tobacco: Never Used  Vaping Use   Vaping Use: Never used  Substance and Sexual Activity   Alcohol use: Yes    Alcohol/week: 2.0 standard drinks    Types: 2 Standard drinks or equivalent per week    Comment: occ   Drug use: No   Sexual activity: Not Currently    Birth control/protection: Surgical    Comment: 1st intercourse 70 yo-Fewer than 5 partners  Other Topics Concern   Not on file  Social History Narrative   Not on file   Social Determinants of Health   Financial Resource Strain: Not on file  Food Insecurity: Not on file  Transportation Needs: Not on file  Physical Activity: Not on file  Stress: Not on file  Social Connections: Not on file  Intimate Partner Violence: Not on file    Review of Systems  All other systems reviewed and are negative.   PHYSICAL EXAMINATION:    BP (!) 144/80    Pulse 79    Ht 5\' 4"  (1.626 m)    Wt 149 lb (67.6 kg)    SpO2 100%    BMI 25.58 kg/m  General appearance: alert, cooperative and appears stated age Head: Normocephalic, without obvious abnormality, atraumatic Neck: no  adenopathy, supple, symmetrical, trachea midline and thyroid normal to inspection and palpation Lungs: clear to auscultation bilaterally Breasts: normal appearance, no masses or tenderness, No nipple retraction or dimpling, No nipple discharge or bleeding, No axillary or supraclavicular adenopathy Heart: regular rate and rhythm Abdomen: soft, non-tender, no masses,  no organomegaly Extremities: extremities normal, atraumatic, no cyanosis or edema Skin: Skin color, texture, turgor normal. No rashes or lesions Lymph nodes: Cervical, supraclavicular, and axillary nodes normal. No abnormal inguinal nodes palpated Neurologic: Grossly normal  Pelvic: External genitalia:  5 mm ulcer of right labia majora.               Urethra:  normal appearing urethra with no masses, tenderness or lesions              Bartholins and Skenes: normal                 Vagina: normal appearing vagina with normal color and discharge, no lesions              Cervix: absent                Bimanual Exam:  Uterus:  absent              Adnexa: no mass, fullness, tenderness              Rectal exam: Yes.  .  Confirms.              Anus:  normal sphincter tone, no lesions  Chaperone was present for exam.  ASSESSMENT  Status post TAH for fibroids.  Ovaries and tubes remain. Osteoporosis.  Hx right hydrosalpinx.  Last [place] in 2017.  Hx HSV.  Active lesion today. Lupus.  FH breast and intestinal cancer.   PLAN  Return for pelvic ultrasound to check hydrosalpinx.  Will get copy of mammogram and bone density.  Final recommendations regarding Fosamax to follow.  Continue calcium and vit D.  Self breast exam encouraged.  Valtrex 500 mg po bid x 3 days as needed.  Does not need refill currently.  Labs with PCP and Rheumatology.    33 min  total time was spent for this patient encounter, including preparation, face-to-face counseling with the patient, coordination of care, and documentation of the encounter.

## 2020-08-23 ENCOUNTER — Ambulatory Visit (INDEPENDENT_AMBULATORY_CARE_PROVIDER_SITE_OTHER): Payer: Medicare Other | Admitting: Obstetrics and Gynecology

## 2020-08-23 ENCOUNTER — Encounter: Payer: Self-pay | Admitting: Obstetrics and Gynecology

## 2020-08-23 ENCOUNTER — Other Ambulatory Visit: Payer: Self-pay

## 2020-08-23 ENCOUNTER — Ambulatory Visit: Payer: Medicare Other | Admitting: Obstetrics and Gynecology

## 2020-08-23 VITALS — BP 144/80 | HR 79 | Ht 64.0 in | Wt 149.0 lb

## 2020-08-23 DIAGNOSIS — M7671 Peroneal tendinitis, right leg: Secondary | ICD-10-CM | POA: Diagnosis not present

## 2020-08-23 DIAGNOSIS — M810 Age-related osteoporosis without current pathological fracture: Secondary | ICD-10-CM

## 2020-08-23 DIAGNOSIS — M792 Neuralgia and neuritis, unspecified: Secondary | ICD-10-CM | POA: Diagnosis not present

## 2020-08-23 DIAGNOSIS — B009 Herpesviral infection, unspecified: Secondary | ICD-10-CM

## 2020-08-23 DIAGNOSIS — N7011 Chronic salpingitis: Secondary | ICD-10-CM

## 2020-08-23 DIAGNOSIS — Z1239 Encounter for other screening for malignant neoplasm of breast: Secondary | ICD-10-CM

## 2020-08-23 DIAGNOSIS — Z01411 Encounter for gynecological examination (general) (routine) with abnormal findings: Secondary | ICD-10-CM | POA: Diagnosis not present

## 2020-08-23 DIAGNOSIS — D2371 Other benign neoplasm of skin of right lower limb, including hip: Secondary | ICD-10-CM | POA: Diagnosis not present

## 2020-08-23 NOTE — Patient Instructions (Signed)

## 2020-09-05 DIAGNOSIS — M5416 Radiculopathy, lumbar region: Secondary | ICD-10-CM | POA: Diagnosis not present

## 2020-09-05 DIAGNOSIS — M545 Low back pain, unspecified: Secondary | ICD-10-CM | POA: Diagnosis not present

## 2020-09-05 DIAGNOSIS — M79671 Pain in right foot: Secondary | ICD-10-CM | POA: Diagnosis not present

## 2020-09-19 DIAGNOSIS — M5416 Radiculopathy, lumbar region: Secondary | ICD-10-CM | POA: Diagnosis not present

## 2020-09-19 DIAGNOSIS — M545 Low back pain, unspecified: Secondary | ICD-10-CM | POA: Diagnosis not present

## 2020-09-19 DIAGNOSIS — M79671 Pain in right foot: Secondary | ICD-10-CM | POA: Diagnosis not present

## 2020-09-26 DIAGNOSIS — M79671 Pain in right foot: Secondary | ICD-10-CM | POA: Diagnosis not present

## 2020-09-26 DIAGNOSIS — M545 Low back pain, unspecified: Secondary | ICD-10-CM | POA: Diagnosis not present

## 2020-09-26 DIAGNOSIS — M5416 Radiculopathy, lumbar region: Secondary | ICD-10-CM | POA: Diagnosis not present

## 2020-10-01 DIAGNOSIS — M79671 Pain in right foot: Secondary | ICD-10-CM | POA: Diagnosis not present

## 2020-10-01 DIAGNOSIS — M545 Low back pain, unspecified: Secondary | ICD-10-CM | POA: Diagnosis not present

## 2020-10-01 DIAGNOSIS — M5416 Radiculopathy, lumbar region: Secondary | ICD-10-CM | POA: Diagnosis not present

## 2020-10-04 ENCOUNTER — Ambulatory Visit (INDEPENDENT_AMBULATORY_CARE_PROVIDER_SITE_OTHER): Payer: Medicare Other

## 2020-10-04 ENCOUNTER — Other Ambulatory Visit: Payer: Self-pay

## 2020-10-04 ENCOUNTER — Encounter: Payer: Self-pay | Admitting: Obstetrics and Gynecology

## 2020-10-04 ENCOUNTER — Ambulatory Visit (INDEPENDENT_AMBULATORY_CARE_PROVIDER_SITE_OTHER): Payer: Medicare Other | Admitting: Obstetrics and Gynecology

## 2020-10-04 VITALS — BP 128/70 | HR 63 | Ht 64.0 in | Wt 149.0 lb

## 2020-10-04 DIAGNOSIS — M8580 Other specified disorders of bone density and structure, unspecified site: Secondary | ICD-10-CM | POA: Diagnosis not present

## 2020-10-04 DIAGNOSIS — N7011 Chronic salpingitis: Secondary | ICD-10-CM

## 2020-10-04 MED ORDER — ALENDRONATE SODIUM 70 MG PO TABS: ORAL_TABLET | ORAL | 4 refills | Status: DC

## 2020-10-04 NOTE — Progress Notes (Signed)
GYNECOLOGY  VISIT   HPI: 70 y.o.   Widowed  [place] American  female   G1P1001 with No LMP recorded. Patient has had a hysterectomy.   here for pelvic ultrasound for history of right hydrosalpinx.   She is considering stopping her Fosamax.  Her sister had a femur fracture while taking a bisphosphonate and has been in long term therapy. She is taking this for a second time and taking it for 2 years.  When she used it in the past, it was many years ago.   BMD report 08/01/20 received from [facility] showing osteopenia of right hip and spine.  T score of right femur neck -1.0 T score of total right femur -0.6 T score of left femur neck -0.7 T score of total left femur +0.10 T score of spine -1.6 (was -2.8) She has statistically significant increase in lumbar spine and total left hip.  Her total right hip is stable.  No future dental procedures.  No reflux.  No renal disease.  No hx of fracture.   GYNECOLOGIC HISTORY: No LMP recorded. Patient has had a hysterectomy. Contraception:  Hyst Menopausal hormone therapy:  none Last mammogram: 06/01/20 - Bi-RADS1, cat B density. Last pap smear: 07-09-09 Neg, 07-04-15 Neg, 05-17-14 Neg         OB History    Gravida  1   Para  1   Term  1   Preterm      AB      Living  1     SAB      IAB      Ectopic      Multiple      Live Births                 Patient Active Problem List   Diagnosis Date Noted  . Trochanteric bursitis of left hip 01/08/2017  . 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 Age-related osteoporosis without current pathological fracture 08/04/2016  . Systemic lupus erythematosus (Progress) 08/01/2016  . Vitamin D deficiency 08/01/2016  . History of IBS 08/01/2016  . Plantar fasciitis 08/01/2016  . Raynaud's disease without gangrene 08/01/2016  . History of hypertension 08/01/2016  . High risk medication use 06/18/2016  . Hypertension   . HSV (herpes simplex virus) infection   . GGYIRSWN(462.7)     Past Medical  History:  Diagnosis Date  . HSV (herpes simplex virus) infection   . Hydrosalpinx   . Hypertension   . Lupus (HCC)    sle and discoid lupus  . Osteoporosis 2019   T score -2.8 statistically improved from prior study  . Spinal stenosis    per patient     Past Surgical History:  Procedure Laterality Date  . ABDOMINAL HYSTERECTOMY  1987   TAH  . back injection  05/2020  . FOOT SURGERY    . ROTATOR CUFF REPAIR  2006  . SHOULDER SURGERY FOR BONE SPUR  2010  . STRESS FRACTURE LEG  08/2007  . TENDON RELEASE Left 01/07/2018  . TUBAL LIGATION  1978  . WRIST SURGERY Right 09/23/2018   tendon release     Current Outpatient Medications  Medication Sig Dispense Refill  . alendronate (FOSAMAX) 70 MG tablet TAKE 1 TABLET BY MOUTH ONCE A WEEK. TAKE WITH A FULL  GLASS OF WATER ON AN EMPTY  STOMACH. 12 tablet 4  . amLODipine (NORVASC) 5 MG tablet Take 5 mg by mouth daily.      . cetirizine (ZYRTEC) 10 MG tablet  Take 1 tablet by mouth as needed.    . Cholecalciferol (VITAMIN D PO) Take 5,000 Units by mouth.    . diclofenac sodium (VOLTAREN) 1 % GEL Apply 3 g topically 3 (three) times daily as needed.    . hydroxychloroquine (PLAQUENIL) 200 MG tablet TAKE 1 TABLET BY MOUTH  EVERY OTHER DAY 45 tablet 0  . linaclotide (LINZESS) 290 MCG CAPS capsule Take by mouth daily before breakfast.     . magnesium oxide (MAG-OX) 400 MG tablet Take 400 mg by mouth daily.    . Melatonin 10 MG CAPS     . montelukast (SINGULAIR) 10 MG tablet Take 10 mg by mouth as needed.    . Multiple Vitamin (MULTIVITAMIN) capsule Take 1 capsule by mouth daily.      . Probiotic Product (PROBIOTIC-10 PO) Take 1 tablet by mouth daily.    Marland Kitchen triamcinolone cream (KENALOG) 0.1 % APP AA ON THE SKIN D PRN  0  . triamterene-hydrochlorothiazide (MAXZIDE-25) 37.5-25 MG tablet Take 1 tablet by mouth daily.    . valACYclovir (VALTREX) 500 MG tablet Take 500 mg by mouth 2 (two) times daily. Take for 3 days as needed.     No current  facility-administered medications for this visit.     ALLERGIES: Patient has no known allergies.  Family History  Problem Relation Age of Onset  . Diabetes Mother   . Hypertension Mother   . Thyroid disease Mother   . Heart disease Mother   . Breast cancer Mother        Age 73  . Diabetes Father   . Hypertension Father   . Cancer Father        COLON  . Heart disease Father   . Crohn's disease Sister   . Hypertension Sister   . Stroke Sister   . Cancer Sister        Intestinal Ca  . Hypertension Brother   . Colitis Sister   . Diabetes Maternal Grandmother   . Diabetes Paternal Grandmother   . Diabetes Paternal Grandfather   . Sarcoidosis Sister   . Cancer Sister   . Crohn's disease Sister   . Healthy Son   . Rheum arthritis Son     Social History   Socioeconomic History  . Marital status: Widowed    Spouse name: Not on file  . Number of children: Not on file  . Years of education: Not on file  . Highest education level: Not on file  Occupational History  . Not on file  Tobacco Use  . Smoking status: Never Smoker  . Smokeless tobacco: Never Used  Vaping Use  . Vaping Use: Never used  Substance and Sexual Activity  . Alcohol use: Yes    Alcohol/week: 2.0 standard drinks    Types: 2 Standard drinks or equivalent per week    Comment: occ  . Drug use: No  . Sexual activity: Not Currently    Birth control/protection: Surgical    Comment: 1st intercourse 70 yo-Fewer than 5 partners  Other Topics Concern  . Not on file  Social History Narrative  . Not on file   Social Determinants of Health   Financial Resource Strain: Not on file  Food Insecurity: Not on file  Transportation Needs: Not on file  Physical Activity: Not on file  Stress: Not on file  Social Connections: Not on file  Intimate Partner Violence: Not on file    Review of Systems  All other systems reviewed and  are negative.   PHYSICAL EXAMINATION:    BP 128/70   Pulse 63   Ht 5\' 4"   (1.626 m)   Wt 149 lb (67.6 kg)   SpO2 99%   BMI 25.58 kg/m     General appearance: alert, cooperative and appears stated age  Pelvic US  Uterus absent.  Normal vaginal cuff.  Right ovary with residual follicle 3 mm, avascular, echofree and with thin walls.  Left ovary normal.  No residual adnexal cyst.  No free fluid.   ASSESSMENT  Hydrosalpinx (right) resolved.  History of osteoporosis.  Successful treatment with Fosamax.  Now with osteopenia.     PLAN  Pelvic ultrasound images and report reviewed.  No routine further pelvic ultrasounds are needed for follow up of a history of hydrosalpinx. We reviewed Up to Date literature about length of treatment of Fosamax (5 years) and about atypical fractures while on bisphosphonates.  She will continue Fosamax for an additional 2 years and then reassess with a bone density. Refills of Fosamax sent to her mail order pharmacy. Calcium 1200 mg total intake daily recommended.  She will continue her current vitamin D regimen.  Fu yearly and prn.      44 min  total time was spent for this patient encounter, including preparation, face-to-face counseling with the patient, coordination of care, and documentation of the encounter.

## 2020-10-05 ENCOUNTER — Telehealth: Payer: Self-pay | Admitting: *Deleted

## 2020-10-05 ENCOUNTER — Encounter: Payer: Self-pay | Admitting: *Deleted

## 2020-10-05 NOTE — Telephone Encounter (Signed)
My chart message sent with Dr. Elza Rafter advice

## 2020-10-05 NOTE — Telephone Encounter (Signed)
Left message for pt to return my call.

## 2020-10-05 NOTE — Telephone Encounter (Signed)
I suspect the bleeding and pain are due to the ultrasound probe having contact with her vaginal tissues which become thin from menopause.  This can occur with a speculum exam as well.  I recommend she take some Tylenol 325 mg every 4 - 5 hours as needed for pain and contact the office back if the pain is persistent or increasing.

## 2020-10-05 NOTE — Telephone Encounter (Signed)
Pt states after [place] she noticed bleeding with pain in vagina.. Bleeding has gone away. But pain is still present severity 2-3. Patient would like to know if this is common after [place] or should she be seen to take a closer look.Will route to provider for recommendations.

## 2020-10-09 DIAGNOSIS — Z8249 Family history of ischemic heart disease and other diseases of the circulatory system: Secondary | ICD-10-CM | POA: Diagnosis not present

## 2020-10-09 DIAGNOSIS — R7303 Prediabetes: Secondary | ICD-10-CM | POA: Diagnosis not present

## 2020-10-09 DIAGNOSIS — M79671 Pain in right foot: Secondary | ICD-10-CM | POA: Diagnosis not present

## 2020-10-09 DIAGNOSIS — M5416 Radiculopathy, lumbar region: Secondary | ICD-10-CM | POA: Diagnosis not present

## 2020-10-09 DIAGNOSIS — I10 Essential (primary) hypertension: Secondary | ICD-10-CM | POA: Diagnosis not present

## 2020-10-09 DIAGNOSIS — Z1389 Encounter for screening for other disorder: Secondary | ICD-10-CM | POA: Diagnosis not present

## 2020-10-09 DIAGNOSIS — M329 Systemic lupus erythematosus, unspecified: Secondary | ICD-10-CM | POA: Diagnosis not present

## 2020-10-09 DIAGNOSIS — M818 Other osteoporosis without current pathological fracture: Secondary | ICD-10-CM | POA: Diagnosis not present

## 2020-10-09 DIAGNOSIS — Z0000 Encounter for general adult medical examination without abnormal findings: Secondary | ICD-10-CM | POA: Diagnosis not present

## 2020-10-09 DIAGNOSIS — E7800 Pure hypercholesterolemia, unspecified: Secondary | ICD-10-CM | POA: Diagnosis not present

## 2020-10-09 DIAGNOSIS — M545 Low back pain, unspecified: Secondary | ICD-10-CM | POA: Diagnosis not present

## 2020-10-10 ENCOUNTER — Other Ambulatory Visit: Payer: Self-pay

## 2020-10-10 NOTE — Telephone Encounter (Signed)
NOTES ON FILE FROM DR Carol Ada, REFERRAL IN PROFICIENT

## 2020-10-15 ENCOUNTER — Ambulatory Visit: Payer: Medicare Other | Admitting: Physician Assistant

## 2020-10-17 ENCOUNTER — Ambulatory Visit: Payer: Medicare Other | Admitting: Physician Assistant

## 2020-10-18 DIAGNOSIS — M47816 Spondylosis without myelopathy or radiculopathy, lumbar region: Secondary | ICD-10-CM | POA: Diagnosis not present

## 2020-10-22 DIAGNOSIS — N951 Menopausal and female climacteric states: Secondary | ICD-10-CM | POA: Diagnosis not present

## 2020-10-22 DIAGNOSIS — R635 Abnormal weight gain: Secondary | ICD-10-CM | POA: Diagnosis not present

## 2020-10-22 DIAGNOSIS — Z23 Encounter for immunization: Secondary | ICD-10-CM | POA: Diagnosis not present

## 2020-10-22 NOTE — Progress Notes (Signed)
Office Visit Note  Patient: Theresa Graham             Date of Birth: [birth year]           MRN: [id number]             PCP: Carol Ada, MD Referring: Carol Ada, MD Visit Date: 11/05/2020 Occupation: @GUAROCC @  Subjective:  Low back pain   History of Present Illness: Theresa Graham is a 70 y.o. female with history of systemic lupus erythematosus.  She is taking plaquenil 200 mg 1 tablet by mouth every other day. She denies any signs or symptoms of a lupus flare. She experiences intermittent rashes on her neck, which are typically are exacerbated by sun exposure.  She tries to avoid direct sun exposure and wears long sleeves if she knows she is going to be outside.  She has been using OTC cortisone cream or triamcinolone cream topically as needed.  She denies any discoid lesions at this time. She denies any signs of alopecia.  She has intermittent symptoms of raynaud's but denies any ulcerations. She continues to take norvasc 5 mg by mouth daily. She denies any oral or nasal ulcers.  She denies any sicca symptoms currently.  She continues to take fosamax 70 mg 1 tablet by mouth once weekly. She continues to take calcium and vitamin D as recommended.  She denies any falls or fractures.  She has been trying to perform weight bearing exercises. She has been exercising intermittent lower back pain.  She has completed physical therapy and has continued home exercises.  She had a MRI of the lumbar spine in fall 2021.  She had a cortisone injection performed by Dr. Jacelyn Grip which improved her symptoms but she continues to have intermittent discomfort and stiffness. Denies any nocturnal pain at this time. She experiences stiffness in both hands in the morning lasting about 15 minutes daily. she denies any joint swelling. She uses voltarel gel topically as needed for pain relief. She has been seeing her podiatrist for management of peritoneal tendonitis of the right ankle.  She completed physical therapy  which improved her symptoms significantly.  She was fitted for a brace which she has tried but she does no plan on continuing to use it since her symptoms have improved. She denies any recent infections.  She has received 4 pfizer covid-19 vaccine doses.      Activities of Daily Living:  Patient reports morning stiffness for 15 minutes.   Patient Denies nocturnal pain.  Difficulty dressing/grooming: Denies Difficulty climbing stairs: Denies Difficulty getting out of chair: Denies Difficulty using hands for taps, buttons, cutlery, and/or writing: Denies  Review of Systems  Constitutional: Positive for fatigue.  HENT: Negative for mouth sores, mouth dryness and nose dryness.   Eyes: Negative for pain, visual disturbance and dryness.  Respiratory: Negative for cough, hemoptysis, shortness of breath and difficulty breathing.   Cardiovascular: Negative for chest pain, palpitations, hypertension and swelling in legs/feet.  Gastrointestinal: Positive for constipation (Taking Linzess). Negative for blood in stool and diarrhea.  Endocrine: Negative for increased urination.  Genitourinary: Negative for painful urination.  Musculoskeletal: Positive for arthralgias, joint pain and morning stiffness. Negative for joint swelling, myalgias, muscle weakness, muscle tenderness and myalgias.  Skin: Positive for color change and sensitivity to sunlight. Negative for pallor, rash, hair loss, nodules/bumps, skin tightness and ulcers.  Allergic/Immunologic: Negative for susceptible to infections.  Neurological: Negative for dizziness, numbness, headaches and weakness.  Hematological: Negative for swollen  glands.  Psychiatric/Behavioral: Positive for sleep disturbance. Negative for depressed mood. The patient is not nervous/anxious.     PMFS History:  Patient Active Problem List   Diagnosis Date Noted  . Trochanteric bursitis of left hip 01/08/2017  . 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 Age-related osteoporosis  without current pathological fracture 08/04/2016  . Systemic lupus erythematosus ([place]) 08/01/2016  . Vitamin D deficiency 08/01/2016  . History of IBS 08/01/2016  . Plantar fasciitis 08/01/2016  . Raynaud's disease without gangrene 08/01/2016  . History of hypertension 08/01/2016  . High risk medication use 06/18/2016  . Hypertension   . HSV (herpes simplex virus) infection   . IRSWNIOE(703.5)     Past Medical History:  Diagnosis Date  . HSV (herpes simplex virus) infection   . Hydrosalpinx   . Hypertension   . Lupus (HCC)    sle and discoid lupus  . Osteoporosis 2019   T score -2.8 statistically improved from prior study  . Spinal stenosis    per patient     Family History  Problem Relation Age of Onset  . Diabetes Mother   . Hypertension Mother   . Thyroid disease Mother   . Heart disease Mother   . Breast cancer Mother        Age 22  . Diabetes Father   . Hypertension Father   . Cancer Father        COLON  . Heart disease Father   . Crohn's disease Sister   . Hypertension Sister   . Stroke Sister   . Cancer Sister        Intestinal Ca  . Hypertension Brother   . Colitis Sister   . Diabetes Maternal Grandmother   . Diabetes Paternal Grandmother   . Diabetes Paternal Grandfather   . Sarcoidosis Sister   . Cancer Sister   . Crohn's disease Sister   . Healthy Son   . Rheum arthritis Son    Past Surgical History:  Procedure Laterality Date  . ABDOMINAL HYSTERECTOMY  1987   TAH  . back injection  05/2020  . FOOT SURGERY    . ROTATOR CUFF REPAIR  2006  . SHOULDER SURGERY FOR BONE SPUR  2010  . STRESS FRACTURE LEG  08/2007  . TENDON RELEASE Left 01/07/2018  . TUBAL LIGATION  1978  . WRIST SURGERY Right 09/23/2018   tendon release    Social History   Social History Narrative  . Not on file   Immunization History  Administered Date(s) Administered  . Influenza Split 05/05/2012  . Influenza,inj,Quad PF,6+ Mos 05/13/2013, 05/17/2014  . PFIZER(Purple  Top)SARS-COV-2 Vaccination 08/16/2019, 09/06/2019, 03/23/2020     Objective: Vital Signs: Wt 152 lb (68.9 kg)   BMI 26.09 kg/m    Physical Exam Vitals and nursing note reviewed.  Constitutional:      Appearance: She is well-developed.  HENT:     Head: Normocephalic and atraumatic.     Mouth/Throat:     Comments: No parotid swelling or tenderness. Eyes:     Conjunctiva/sclera: Conjunctivae normal.  Pulmonary:     Effort: Pulmonary effort is normal.  Abdominal:     Palpations: Abdomen is soft.  Musculoskeletal:     Cervical back: Normal range of motion.  Lymphadenopathy:     Cervical: No cervical adenopathy.  Skin:    General: Skin is warm and dry.     Capillary Refill: Capillary refill takes less than 2 seconds.     Comments: No digital ulcerations or  signs of gangrene. No discoid lesions noted.  Neurological:     Mental Status: She is alert and oriented to person, place, and time.  Psychiatric:        Behavior: Behavior normal.      Musculoskeletal Exam: C-spine, thoracic spine, and lumbar spine good ROM. No midline spinal tenderness or SI joint tenderness. Shoulder joints, elbow joints, wrist joints, MCPs, PIPs, and DIPs good ROM with no synovitis.  Complete fist formation bilaterally.  Synovial thickening over MCPs of the right hand.  Hip joints good ROM with no discomfort.  Knee joints good ROM with no warmth or effusion.  Ankle joints good ROM with no tenderness or joint swelling.    CDAI Exam: CDAI Score: -- Patient Global: --; Provider Global: -- Swollen: --; Tender: -- Joint Exam 11/05/2020   No joint exam has been documented for this visit   There is currently no information documented on the homunculus. Go to the Rheumatology activity and complete the homunculus joint exam.  Investigation: No additional findings.  Imaging: No results found.  Recent Labs: Lab Results  Component Value Date   WBC 5.3 05/31/2020   HGB 14.4 05/31/2020   PLT 290  05/31/2020   NA 137 05/31/2020   K 4.0 05/31/2020   CL 100 05/31/2020   CO2 25 05/31/2020   GLUCOSE 101 05/31/2020   BUN 19 05/31/2020   CREATININE 0.96 05/31/2020   BILITOT 0.9 05/31/2020   ALKPHOS 69 08/04/2016   AST 11 05/31/2020   ALT 17 05/31/2020   PROT 7.2 05/31/2020   ALBUMIN 4.3 08/04/2016   CALCIUM 10.0 05/31/2020   GFRAA 70 05/31/2020    Speciality Comments: PLQ Eye Exam: 06/18/2020 @ [facility] follow up in 1 year  Procedures:  No procedures performed Allergies: Patient has no known allergies.   Assessment / Plan:     Visit Diagnoses: Other systemic lupus erythematosus with other organ involvement (HCC) - Positive ANA, positive Smith, positive RNP, positive Ro, positive Raynauds, positive photosensitivity, positive arthritis, DLE: She has not had any signs or symptoms of a flare.  She is clinically doing well taking plaquenil 200 mg 1 tablet by mouth every other day.  She is tolerating PLQ without any side effects.  Lab work from 05/31/20 was reviewed today in the office: dsDNA is negative, complements WNL, and ESR WNL.  She is due to update lab work so orders were provided.  She has ongoing photosensitivity and has a occasional rashes on her neck.  No discoid lesions at this time.  Discussed the importance of wearing sunscreen and sun protective clothing on a daily basis.  She has intermittent symptoms of raynaud's but no ulcerations were noted. She will remain on norvasc 5 mg 1 tablet by mouth daily.  She has not had any oral or nasal ulcers or sicca symptoms recently.  No joint tenderness or synovitis was noted on exam.  She has not had any palpitations, SOB, or pleuritic chest pain.  No cervical lymphadenopathy or parotid swelling/tenderness was noted. She will remain on the current dose of PLQ as prescribed.  She was advised to notify us if she develops signs or symptoms of a flare.  She will follow up in 5 months.   - Plan: Urinalysis, Routine w reflex  microscopic, Anti-DNA antibody, double-stranded, Sedimentation rate, C3 and C4  Discoid lupus: No recurrence since her last office visit. Discussed the importance of avoiding direct sun exposure and to wear sunscreen on a daily basis.  She  will continue taking PLQ as prescribed.  She has triamcinolone cream which she can use as needed. She was advised to notify us if she has a flare.  High risk medication use - Plaquenil 200 mg 1 tablet by mouth every other day.  PLQ Eye Exam: 06/18/2020 @ [facility] follow up in 1 year.  CBC and CMP updated on 10/09/20.   She has not had any recent infections. She has received 4 pfizer covid-19 vaccine doses.  Hypopigmentation: Due to previous rash.   Raynaud's syndrome without gangrene: She has intermittent symptoms of raynaud's.  No digital ulcerations or signs of gangrene. Discussed the importance of avoiding triggers. She will continue taking norvasc 5 mg 1 tablet by mouth daily.  De Quervain's tenosynovitis, bilateral: Resolved.   Age-related osteoporosis without current pathological fracture - DEXA 05/26/2018 T score -2.8 lumbar spine, BMD 0.845 +8% change compared to Nov 2017. Restarted fosamax 08/04/16, previously on it for about 46yr  She had a recent DEXA  On 08/01/20 which revealed an improvement in the spine from -2.8 to -1.6.  She continues to take fosamax 70 mg 1 tablet by mouth once weekly.  She has also been taking calcium and vitamin D 5,000 units daily as recommended.  She performs weight bearing exercises. She has not had any recent falls or fractures. She will likely require a drug holiday after updating her next DEXA in 2024.  Vitamin D deficiency: She has been taking vitamin D 5,000 units daily.    DDD (degenerative disc disease), lumbar: Chronic pain.  According to the patient she had a MRI of the lumbar spine in fall 2021-no records in epic.  She has completed physical therapy and continues to perform home exercises.  She had  an injection performed by Dr. WJacelyn Griprecently, which alleviated most of her discomfort.  She does not want to proceed with a spinal nerve ablation at this time.   Trochanteric bursitis of left hip: Resolved   Other medical conditions are listed as follow:   History of hypertension  History of IBS  Orders: Orders Placed This Encounter  Procedures  . Urinalysis, Routine w reflex microscopic  . Anti-DNA antibody, double-stranded  . Sedimentation rate  . C3 and C4   Meds ordered this encounter  Medications  . hydroxychloroquine (PLAQUENIL) 200 MG tablet    Sig: Take 1 tablet (200 mg total) by mouth every other day.    Dispense:  45 tablet    Refill:  0     Follow-Up Instructions: Return in about 5 months (around 04/07/2021) for Systemic lupus erythematosus.   TOfilia Neas PA-C  Note - This record has been created using Dragon software.  Chart creation errors have been sought, but may not always  have been located. Such creation errors do not reflect on  the standard of medical care.

## 2020-10-25 DIAGNOSIS — M792 Neuralgia and neuritis, unspecified: Secondary | ICD-10-CM | POA: Diagnosis not present

## 2020-10-25 DIAGNOSIS — R635 Abnormal weight gain: Secondary | ICD-10-CM | POA: Diagnosis not present

## 2020-10-25 DIAGNOSIS — I10 Essential (primary) hypertension: Secondary | ICD-10-CM | POA: Diagnosis not present

## 2020-10-25 DIAGNOSIS — Z1331 Encounter for screening for depression: Secondary | ICD-10-CM | POA: Diagnosis not present

## 2020-10-25 DIAGNOSIS — M7671 Peroneal tendinitis, right leg: Secondary | ICD-10-CM | POA: Diagnosis not present

## 2020-10-25 DIAGNOSIS — N951 Menopausal and female climacteric states: Secondary | ICD-10-CM | POA: Diagnosis not present

## 2020-10-25 DIAGNOSIS — M8580 Other specified disorders of bone density and structure, unspecified site: Secondary | ICD-10-CM | POA: Diagnosis not present

## 2020-10-25 DIAGNOSIS — Z6825 Body mass index (BMI) 25.0-25.9, adult: Secondary | ICD-10-CM | POA: Diagnosis not present

## 2020-10-25 DIAGNOSIS — L602 Onychogryphosis: Secondary | ICD-10-CM | POA: Diagnosis not present

## 2020-10-25 DIAGNOSIS — Z1339 Encounter for screening examination for other mental health and behavioral disorders: Secondary | ICD-10-CM | POA: Diagnosis not present

## 2020-11-05 ENCOUNTER — Ambulatory Visit (INDEPENDENT_AMBULATORY_CARE_PROVIDER_SITE_OTHER): Payer: Medicare Other | Admitting: Physician Assistant

## 2020-11-05 ENCOUNTER — Other Ambulatory Visit: Payer: Self-pay

## 2020-11-05 ENCOUNTER — Encounter: Payer: Self-pay | Admitting: Physician Assistant

## 2020-11-05 ENCOUNTER — Ambulatory Visit: Payer: Medicare Other | Admitting: Physician Assistant

## 2020-11-05 VITALS — BP 147/81 | HR 63 | Resp 12 | Ht 64.0 in | Wt 152.0 lb

## 2020-11-05 DIAGNOSIS — Z79899 Other long term (current) drug therapy: Secondary | ICD-10-CM | POA: Diagnosis not present

## 2020-11-05 DIAGNOSIS — M7062 Trochanteric bursitis, left hip: Secondary | ICD-10-CM | POA: Diagnosis not present

## 2020-11-05 DIAGNOSIS — Z8679 Personal history of other diseases of the circulatory system: Secondary | ICD-10-CM | POA: Diagnosis not present

## 2020-11-05 DIAGNOSIS — E559 Vitamin D deficiency, unspecified: Secondary | ICD-10-CM | POA: Diagnosis not present

## 2020-11-05 DIAGNOSIS — Z8719 Personal history of other diseases of the digestive system: Secondary | ICD-10-CM

## 2020-11-05 DIAGNOSIS — L819 Disorder of pigmentation, unspecified: Secondary | ICD-10-CM | POA: Diagnosis not present

## 2020-11-05 DIAGNOSIS — M5136 Other intervertebral disc degeneration, lumbar region: Secondary | ICD-10-CM

## 2020-11-05 DIAGNOSIS — L930 Discoid lupus erythematosus: Secondary | ICD-10-CM

## 2020-11-05 DIAGNOSIS — M3219 Other organ or system involvement in systemic lupus erythematosus: Secondary | ICD-10-CM | POA: Diagnosis not present

## 2020-11-05 DIAGNOSIS — I7300 Raynaud's syndrome without gangrene: Secondary | ICD-10-CM

## 2020-11-05 DIAGNOSIS — I10 Essential (primary) hypertension: Secondary | ICD-10-CM | POA: Diagnosis not present

## 2020-11-05 DIAGNOSIS — M810 Age-related osteoporosis without current pathological fracture: Secondary | ICD-10-CM | POA: Diagnosis not present

## 2020-11-05 DIAGNOSIS — M47816 Spondylosis without myelopathy or radiculopathy, lumbar region: Secondary | ICD-10-CM | POA: Diagnosis not present

## 2020-11-05 DIAGNOSIS — Z6825 Body mass index (BMI) 25.0-25.9, adult: Secondary | ICD-10-CM | POA: Diagnosis not present

## 2020-11-05 DIAGNOSIS — M654 Radial styloid tenosynovitis [de Quervain]: Secondary | ICD-10-CM | POA: Diagnosis not present

## 2020-11-05 MED ORDER — HYDROXYCHLOROQUINE SULFATE 200 MG PO TABS: 200.0000 mg | ORAL_TABLET | ORAL | 0 refills | Status: DC

## 2020-11-05 NOTE — Patient Instructions (Signed)
Standing Labs We placed an order today for your standing lab work.   Please have your standing labs drawn in April   We have open lab daily Monday through Thursday from 1:30-4:30 PM and Friday from 1:30-4:00 PM at the office of Dr. Bo Merino, [facility].   Please be advised, all patients with office appointments requiring lab work will take precedents over walk-in lab work.  If possible, please come for your lab work on Monday and Friday afternoons, as you may experience shorter wait times. The office is located at [address] [place], [place], [place] [address] No appointment is necessary.   Labs are drawn by [facility]. Please bring your co-pay at the time of your lab draw.  You may receive a bill from [facility] for your lab work.  If you wish to have your labs drawn at another location, please call the office 24 hours in advance to send orders.  If you have any questions regarding directions or hours of operation,  please call [phone].   As a reminder, please drink plenty of water prior to coming for your lab work. Thanks!

## 2020-11-06 LAB — URINALYSIS, ROUTINE W REFLEX MICROSCOPIC
Bilirubin Urine: NEGATIVE
Glucose, UA: NEGATIVE
Hgb urine dipstick: NEGATIVE
Ketones, ur: NEGATIVE
Leukocytes,Ua: NEGATIVE
Nitrite: NEGATIVE
Protein, ur: NEGATIVE
Specific Gravity, Urine: 1.01 (ref 1.001–1.03)
pH: 6 (ref 5.0–8.0)

## 2020-11-06 LAB — C3 AND C4
C3 Complement: 123 mg/dL (ref 83–193)
C4 Complement: 31 mg/dL (ref 15–57)

## 2020-11-06 LAB — ANTI-DNA ANTIBODY, DOUBLE-STRANDED: ds DNA Ab: <1 IU/mL

## 2020-11-06 LAB — SEDIMENTATION RATE: Sed Rate: 6 mm/h (ref 0–30)

## 2020-11-06 NOTE — Progress Notes (Signed)
ESR and complement WNL.  dsDNA is negative.  UA normal.

## 2020-11-09 ENCOUNTER — Other Ambulatory Visit: Payer: Self-pay | Admitting: Physician Assistant

## 2020-11-12 DIAGNOSIS — Z6825 Body mass index (BMI) 25.0-25.9, adult: Secondary | ICD-10-CM | POA: Diagnosis not present

## 2020-11-12 DIAGNOSIS — I10 Essential (primary) hypertension: Secondary | ICD-10-CM | POA: Diagnosis not present

## 2020-11-15 DIAGNOSIS — R140 Abdominal distension (gaseous): Secondary | ICD-10-CM | POA: Diagnosis not present

## 2020-11-15 DIAGNOSIS — K5730 Diverticulosis of large intestine without perforation or abscess without bleeding: Secondary | ICD-10-CM | POA: Diagnosis not present

## 2020-11-15 DIAGNOSIS — K5904 Chronic idiopathic constipation: Secondary | ICD-10-CM | POA: Diagnosis not present

## 2020-11-16 DIAGNOSIS — M545 Low back pain, unspecified: Secondary | ICD-10-CM | POA: Diagnosis not present

## 2020-11-16 DIAGNOSIS — M79671 Pain in right foot: Secondary | ICD-10-CM | POA: Diagnosis not present

## 2020-11-16 DIAGNOSIS — M5416 Radiculopathy, lumbar region: Secondary | ICD-10-CM | POA: Diagnosis not present

## 2020-11-19 DIAGNOSIS — I10 Essential (primary) hypertension: Secondary | ICD-10-CM | POA: Diagnosis not present

## 2020-11-19 DIAGNOSIS — Z6824 Body mass index (BMI) 24.0-24.9, adult: Secondary | ICD-10-CM | POA: Diagnosis not present

## 2020-11-27 DIAGNOSIS — Z6824 Body mass index (BMI) 24.0-24.9, adult: Secondary | ICD-10-CM | POA: Diagnosis not present

## 2020-11-27 DIAGNOSIS — I10 Essential (primary) hypertension: Secondary | ICD-10-CM | POA: Diagnosis not present

## 2020-12-06 DIAGNOSIS — I10 Essential (primary) hypertension: Secondary | ICD-10-CM | POA: Diagnosis not present

## 2020-12-06 DIAGNOSIS — Z6824 Body mass index (BMI) 24.0-24.9, adult: Secondary | ICD-10-CM | POA: Diagnosis not present

## 2020-12-07 ENCOUNTER — Ambulatory Visit: Payer: Medicare Other | Admitting: Cardiology

## 2020-12-14 DIAGNOSIS — Z6824 Body mass index (BMI) 24.0-24.9, adult: Secondary | ICD-10-CM | POA: Diagnosis not present

## 2020-12-14 DIAGNOSIS — M2550 Pain in unspecified joint: Secondary | ICD-10-CM | POA: Diagnosis not present

## 2020-12-26 DIAGNOSIS — H6121 Impacted cerumen, right ear: Secondary | ICD-10-CM | POA: Diagnosis not present

## 2020-12-31 DIAGNOSIS — I10 Essential (primary) hypertension: Secondary | ICD-10-CM | POA: Diagnosis not present

## 2020-12-31 DIAGNOSIS — Z6824 Body mass index (BMI) 24.0-24.9, adult: Secondary | ICD-10-CM | POA: Diagnosis not present

## 2021-01-06 ENCOUNTER — Other Ambulatory Visit: Payer: Self-pay | Admitting: Physician Assistant

## 2021-01-07 DIAGNOSIS — M722 Plantar fascial fibromatosis: Secondary | ICD-10-CM | POA: Diagnosis not present

## 2021-01-07 DIAGNOSIS — Z6824 Body mass index (BMI) 24.0-24.9, adult: Secondary | ICD-10-CM | POA: Diagnosis not present

## 2021-01-07 DIAGNOSIS — M7671 Peroneal tendinitis, right leg: Secondary | ICD-10-CM | POA: Diagnosis not present

## 2021-01-07 DIAGNOSIS — M792 Neuralgia and neuritis, unspecified: Secondary | ICD-10-CM | POA: Diagnosis not present

## 2021-01-07 DIAGNOSIS — M2550 Pain in unspecified joint: Secondary | ICD-10-CM | POA: Diagnosis not present

## 2021-01-09 ENCOUNTER — Other Ambulatory Visit: Payer: Self-pay | Admitting: Physician Assistant

## 2021-01-14 DIAGNOSIS — I10 Essential (primary) hypertension: Secondary | ICD-10-CM | POA: Diagnosis not present

## 2021-01-14 DIAGNOSIS — Z6823 Body mass index (BMI) 23.0-23.9, adult: Secondary | ICD-10-CM | POA: Diagnosis not present

## 2021-01-23 DIAGNOSIS — M810 Age-related osteoporosis without current pathological fracture: Secondary | ICD-10-CM | POA: Diagnosis not present

## 2021-01-23 DIAGNOSIS — I10 Essential (primary) hypertension: Secondary | ICD-10-CM | POA: Diagnosis not present

## 2021-01-23 DIAGNOSIS — Z6823 Body mass index (BMI) 23.0-23.9, adult: Secondary | ICD-10-CM | POA: Diagnosis not present

## 2021-01-25 ENCOUNTER — Other Ambulatory Visit: Payer: Self-pay | Admitting: Physician Assistant

## 2021-01-25 DIAGNOSIS — Z20822 Contact with and (suspected) exposure to covid-19: Secondary | ICD-10-CM | POA: Diagnosis not present

## 2021-01-28 NOTE — Telephone Encounter (Signed)
Last Visit: 11/05/2020 Next Visit: 04/17/2021 Labs: 11/05/2020 ESR and complement WNL.  dsDNA is negative.  UA normal.  Eye exam: 06/18/2020   Current Dose per office note on 11/05/2020: Plaquenil 200 mg 1 tablet by mouth every other day.  DX: Other systemic lupus erythematosus with other organ involvement  Last Fill: 11/05/2020   In need of CBC and CMP, patient states she had drawn at PCP in March 2022. I called PCP and requested lab results.   Okay to refill Plaquenil?

## 2021-01-30 ENCOUNTER — Telehealth: Payer: Self-pay | Admitting: *Deleted

## 2021-01-30 NOTE — Telephone Encounter (Signed)
Labs received from:Dr. Carol Ada  Drawn on:10/09/2020  Reviewed by:Hazel Sams PA-C  Labs drawn:CBC, CMP, Hgb A1C, Lipid panel, TSH  Results:Baso % 1.4, Chloride 96, Calcium 10.4, Albumin 4.9, T. Bilirubin 1.2, Hgb A1C 6.0, Cholesterol 219, HDLD 114, Chol/HDL Ratio 1.9  Patient on PLQ 200 mg po every other day.

## 2021-02-04 DIAGNOSIS — I10 Essential (primary) hypertension: Secondary | ICD-10-CM | POA: Diagnosis not present

## 2021-02-04 DIAGNOSIS — Z6823 Body mass index (BMI) 23.0-23.9, adult: Secondary | ICD-10-CM | POA: Diagnosis not present

## 2021-02-07 ENCOUNTER — Encounter: Payer: Self-pay | Admitting: Cardiology

## 2021-02-07 ENCOUNTER — Ambulatory Visit (INDEPENDENT_AMBULATORY_CARE_PROVIDER_SITE_OTHER): Payer: Medicare Other | Admitting: Cardiology

## 2021-02-07 ENCOUNTER — Other Ambulatory Visit: Payer: Self-pay

## 2021-02-07 ENCOUNTER — Ambulatory Visit (INDEPENDENT_AMBULATORY_CARE_PROVIDER_SITE_OTHER)
Admission: RE | Admit: 2021-02-07 | Discharge: 2021-02-07 | Disposition: A | Discharge status: Home / Self Care | Payer: Self-pay | Source: Ambulatory Visit | Attending: Cardiology | Admitting: Cardiology

## 2021-02-07 VITALS — BP 120/60 | HR 58 | Ht 64.0 in | Wt 138.0 lb

## 2021-02-07 DIAGNOSIS — I10 Essential (primary) hypertension: Secondary | ICD-10-CM

## 2021-02-07 DIAGNOSIS — Z8249 Family history of ischemic heart disease and other diseases of the circulatory system: Secondary | ICD-10-CM

## 2021-02-07 DIAGNOSIS — R072 Precordial pain: Secondary | ICD-10-CM | POA: Diagnosis not present

## 2021-02-07 DIAGNOSIS — Z20822 Contact with and (suspected) exposure to covid-19: Secondary | ICD-10-CM | POA: Diagnosis not present

## 2021-02-07 NOTE — Progress Notes (Signed)
Cardiology Office Note:    Date:  02/07/2021   ID:  Theresa Graham, DOB 03/24/1951, MRN [id number]  PCP:  Carol Ada, MD   [facility] Providers Cardiologist:  None     Referring MD: Carol Ada, MD    History of Present Illness:    Theresa Graham is a 70 y.o. female here for evaluation of early family history of coronary artery disease at the request of Dr. Carol Ada.  She has a history of Raynaud's phenomenon prediabetes, mild lupus hypertension diagnosed in her 55s.  She is currently taking amlodipine 5 mg and triamterene hydrochlorothiazide 37.5/25 mg.  Her father died at age 44 from a myocardial infarction.  1 sister had a TIA in her 6s.  Another sister had a myocardial infarction at age 69 and AFIB, CVA. Mother died 38 MI. Brother had MI at 33.  LDL 96. Walks exercise.  Occasionally utilizes an electrolyte packet before and exercise to help prevent cramping.  This is fine.  BP controlled.  She is not having any symptoms, no chest pain, no shortness of breath, no fever chills nausea vomiting syncope palpitations bleeding.  She works at [employer].  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   Lupus ([place])    Osteoporosis 2019   T score -2.8 statistically improved from prior study   Prediabetes    Raynaud's disease    Spinal stenosis    per patient     Past Surgical History:  Procedure Laterality Date   ABDOMINAL HYSTERECTOMY  1987   TAH   back injection  05/2020   FOOT SURGERY Left 2010   hysterectomy  1987   ROTATOR CUFF REPAIR  2006   SHOULDER SURGERY FOR  BONE SPUR  2010   STRESS FRACTURE LEG  08/2007   TENDON RELEASE Left 01/07/2018   TUBAL LIGATION  1978   WRIST SURGERY Right 09/23/2018   tendon release    WRIST SURGERY Left 12/2017    Current Medications: Current Meds  Medication Sig   alendronate (FOSAMAX) 70 MG tablet TAKE 1 TABLET BY MOUTH ONCE A WEEK. TAKE WITH A FULL  GLASS OF WATER ON AN EMPTY  STOMACH.   amLODipine (NORVASC) 5 MG tablet Take 5 mg by mouth daily.     cetirizine (ZYRTEC) 10 MG tablet Take 1 tablet by mouth as needed.   Cholecalciferol (VITAMIN D PO) Take 5,000 Units by mouth.   diclofenac sodium (VOLTAREN) 1 % GEL Apply 3 g topically 3 (three) times daily as needed.   hydroxychloroquine (PLAQUENIL) 200 MG tablet TAKE 1 TABLET BY MOUTH  EVERY OTHER DAY   linaclotide (LINZESS) 290 MCG CAPS capsule Take by mouth daily before breakfast.    magnesium oxide (MAG-OX) 400 MG tablet Take 400 mg by mouth daily.   Melatonin 10 MG CAPS as needed.   montelukast (SINGULAIR) 10 MG tablet Take 10 mg by mouth as needed.   Multiple Vitamin (MULTIVITAMIN) capsule Take 1 capsule by mouth daily.     Probiotic Product (PROBIOTIC-10 PO) Take 1 tablet by  mouth daily.   triamcinolone cream (KENALOG) 0.1 % APP AA ON THE SKIN D PRN   triamterene-hydrochlorothiazide (MAXZIDE-25) 37.5-25 MG tablet Take 1 tablet by mouth daily.     Allergies:   Patient has no known allergies.   Social History   Socioeconomic History   Marital status: Widowed    Spouse name: Not on file   Number of children: Not on file   Years of education: Not on file   Highest education level: Not on file  Occupational History   Not on file  Tobacco Use   Smoking status: Never   Smokeless tobacco: Never  Vaping Use   Vaping Use: Never used  Substance and Sexual Activity   Alcohol use: Yes    Alcohol/week: 2.0 standard drinks    Types: 2 Standard drinks or equivalent per week    Comment: occ   Drug use: No   Sexual activity: Not Currently    Birth  control/protection: Surgical    Comment: 1st intercourse 70 yo-Fewer than 5 partners  Other Topics Concern   Not on file  Social History Narrative   Not on file   Social Determinants of Health   Financial Resource Strain: Not on file  Food Insecurity: Not on file  Transportation Needs: Not on file  Physical Activity: Not on file  Stress: Not on file  Social Connections: Not on file     Family History: The patient's family history includes Breast cancer in her mother; Cancer in her father, mother, sister, and sister; Colitis in her sister; Crohn's disease in her sister and sister; Diabetes in her father, maternal grandmother, mother, paternal grandfather, and paternal grandmother; Healthy in her son; Heart disease in her father and mother; Hypertension in her brother, father, maternal grandmother, mother, paternal grandmother, and sister; Rheum arthritis in her son; Sarcoidosis in her sister; Stroke in her sister; Thyroid disease in her mother.  ROS:   Please see the history of present illness.    Denies any fevers chills nausea vomiting syncope bleeding chest pain shortness of breath all other systems reviewed and are negative.  EKGs/Labs/Other Studies Reviewed:     EKG:  EKG is  ordered today.  The ekg ordered today demonstrates sinus rhythm/sinus bradycardia rate 58 with nonspecific ST-T wave changes  Recent Labs: 05/31/2020: ALT 17; BUN 19; Creat 0.96; Hemoglobin 14.4; Platelets 290; Potassium 4.0; Sodium 137  Recent Lipid Panel No results found for: CHOL, TRIG, HDL, CHOLHDL, VLDL, LDLCALC, LDLDIRECT   Risk Assessment/Calculations:          Physical Exam:    VS:  BP 120/60 (BP Location: Left Arm, Patient Position: Sitting, Cuff Size: Normal)   Pulse (!) 58   Ht 5\' 4"  (1.626 m)   Wt 138 lb (62.6 kg)   SpO2 97%   BMI 23.69 kg/m     Wt Readings from Last 3 Encounters:  02/07/21 138 lb (62.6 kg)  11/05/20 152 lb (68.9 kg)  10/04/20 149 lb (67.6 kg)     GEN:   Well nourished, well developed in no acute distress HEENT: Normal NECK: No JVD; No carotid bruits LYMPHATICS: No lymphadenopathy CARDIAC: RRR, no murmurs, rubs, gallops RESPIRATORY:  Clear to auscultation without rales, wheezing or rhonchi  ABDOMEN: Soft, non-tender, non-distended MUSCULOSKELETAL:  No edema; No deformity  SKIN: Warm and dry NEUROLOGIC:  Alert and oriented x 3 PSYCHIATRIC:  Normal affect   ASSESSMENT:    1. Hypertension, unspecified type   2. Family history of cardiovascular disease  3. Precordial pain     PLAN:    In order of problems listed above:  Family history of CAD/MI - Would not be unreasonable to proceed with coronary calcium score, $99 test, in order to further stratify.  If coronary atherosclerosis was present, would likely recommend further enhanced goal-directed medical therapy with Crestor for instance.  Discussed preventive strategies diet, exercise.  Essential hypertension - Very well controlled on triamterene hydrochlorothiazide and amlodipine.  Excellent.  No changes made.  She watches her salt.  Continue with exercise.  Total cholesterol 219 HDL 114 LDL 96 triglycerides 49.  ALT 19 hemoglobin A1c 6 hemoglobin 14.1 creatinine 0.9 potassium 3.9 from outside labs.   Medication Adjustments/Labs and Tests Ordered: Current medicines are reviewed at length with the patient today.  Concerns regarding medicines are outlined above.  Orders Placed This Encounter  Procedures   CT CARDIAC SCORING (SELF PAY ONLY)   EKG 12-Lead    No orders of the defined types were placed in this encounter.   Patient Instructions  Medication Instructions:  The current medical regimen is effective;  continue present plan and medications.  *If you need a refill on your cardiac medications before your next appointment, please call your pharmacy*  Testing/Procedures: Your physician has requested that you have Coronary Calcium score which is completed by CT. Cardiac  computed tomography (CT) is a painless test that uses an x-ray machine to take clear, detailed pictures of your heart.   Follow-Up: At [facility], you and your health needs are our priority.  As part of our continuing mission to provide you with exceptional heart care, we have created designated Provider Care Teams.  These Care Teams include your primary Cardiologist (physician) and Advanced Practice Providers (APPs -  Physician Assistants and Nurse Practitioners) who all work together to provide you with the care you need, when you need it.  We recommend signing up for the patient portal called "MyChart".  Sign up information is provided on this After Visit Summary.  MyChart is used to connect with patients for Virtual Visits (Telemedicine).  Patients are able to view lab/test results, encounter notes, upcoming appointments, etc.  Non-urgent messages can be sent to your provider as well.   To learn more about what you can do with MyChart, go to [email or web address].    Your next appointment:   As needed.  Thank you for choosing [facility]!!     Signed, Candee Furbish, MD  02/07/2021 3:46 PM    [place]

## 2021-02-07 NOTE — Patient Instructions (Signed)
Medication Instructions:  The current medical regimen is effective;  continue present plan and medications.  *If you need a refill on your cardiac medications before your next appointment, please call your pharmacy*  Testing/Procedures: Your physician has requested that you have Coronary Calcium score which is completed by CT. Cardiac computed tomography (CT) is a painless test that uses an x-ray machine to take clear, detailed pictures of your heart.   Follow-Up: At [facility], you and your health needs are our priority.  As part of our continuing mission to provide you with exceptional heart care, we have created designated Provider Care Teams.  These Care Teams include your primary Cardiologist (physician) and Advanced Practice Providers (APPs -  Physician Assistants and Nurse Practitioners) who all work together to provide you with the care you need, when you need it.  We recommend signing up for the patient portal called "MyChart".  Sign up information is provided on this After Visit Summary.  MyChart is used to connect with patients for Virtual Visits (Telemedicine).  Patients are able to view lab/test results, encounter notes, upcoming appointments, etc.  Non-urgent messages can be sent to your provider as well.   To learn more about what you can do with MyChart, go to [email or web address].    Your next appointment:   As needed.  Thank you for choosing [facility]!!

## 2021-02-08 ENCOUNTER — Telehealth: Payer: Self-pay | Admitting: Cardiology

## 2021-02-08 NOTE — Telephone Encounter (Signed)
Called and spoke with pt regarding her Calcium score.  Advised Dr Marlou Porch has not reviewed these results at this time however when he does she will be contacted with them and any new orders.  She states understanding and will await a return phone call.

## 2021-02-08 NOTE — Telephone Encounter (Signed)
Pt is calling to go over her results

## 2021-02-11 ENCOUNTER — Telehealth: Payer: Self-pay | Admitting: Cardiology

## 2021-02-11 DIAGNOSIS — Z79899 Other long term (current) drug therapy: Secondary | ICD-10-CM

## 2021-02-11 DIAGNOSIS — I7090 Unspecified atherosclerosis: Secondary | ICD-10-CM

## 2021-02-11 DIAGNOSIS — R931 Abnormal findings on diagnostic imaging of heart and coronary circulation: Secondary | ICD-10-CM

## 2021-02-11 MED ORDER — ROSUVASTATIN CALCIUM 10 MG PO TABS: 10.0000 mg | ORAL_TABLET | Freq: Every day | ORAL | 3 refills | Status: DC

## 2021-02-11 NOTE — Telephone Encounter (Signed)
The patient has been notified of the result and verbalized understanding.  All questions (if any) were answered. Darrell Jewel, RN 02/11/2021 12:24 PM    Ordered Crestor, ordered labs, schedules labs. Patient in agreement and verbalized understanding.

## 2021-02-11 NOTE — Telephone Encounter (Signed)
Left message to call back  

## 2021-02-11 NOTE — Telephone Encounter (Signed)
-----   Message from Jerline Pain, MD sent at 02/08/2021  5:23 PM EDT ----- Coronary artery calcification is noted.  Given this finding, recommend starting Crestor '10mg'$  PO QD. Repeat lipid panel in 3 months with ALT.  This will help stabilize the heart.  Eventual goal will be Crestor '20mg'$  PO QD.   Candee Furbish, MD

## 2021-02-11 NOTE — Telephone Encounter (Signed)
Patient returning call for CT results. 

## 2021-02-13 ENCOUNTER — Other Ambulatory Visit: Payer: Self-pay | Admitting: Family Medicine

## 2021-02-13 DIAGNOSIS — M24571 Contracture, right ankle: Secondary | ICD-10-CM | POA: Diagnosis not present

## 2021-02-13 DIAGNOSIS — Z6823 Body mass index (BMI) 23.0-23.9, adult: Secondary | ICD-10-CM | POA: Diagnosis not present

## 2021-02-13 DIAGNOSIS — N858 Other specified noninflammatory disorders of uterus: Secondary | ICD-10-CM

## 2021-02-13 DIAGNOSIS — M2550 Pain in unspecified joint: Secondary | ICD-10-CM | POA: Diagnosis not present

## 2021-02-13 DIAGNOSIS — M24572 Contracture, left ankle: Secondary | ICD-10-CM | POA: Diagnosis not present

## 2021-02-13 DIAGNOSIS — M7671 Peroneal tendinitis, right leg: Secondary | ICD-10-CM | POA: Diagnosis not present

## 2021-02-13 DIAGNOSIS — M722 Plantar fascial fibromatosis: Secondary | ICD-10-CM | POA: Diagnosis not present

## 2021-02-25 DIAGNOSIS — I10 Essential (primary) hypertension: Secondary | ICD-10-CM | POA: Diagnosis not present

## 2021-02-25 DIAGNOSIS — Z6823 Body mass index (BMI) 23.0-23.9, adult: Secondary | ICD-10-CM | POA: Diagnosis not present

## 2021-03-04 DIAGNOSIS — I10 Essential (primary) hypertension: Secondary | ICD-10-CM | POA: Diagnosis not present

## 2021-03-04 DIAGNOSIS — Z6822 Body mass index (BMI) 22.0-22.9, adult: Secondary | ICD-10-CM | POA: Diagnosis not present

## 2021-03-04 DIAGNOSIS — K5901 Slow transit constipation: Secondary | ICD-10-CM | POA: Diagnosis not present

## 2021-03-18 DIAGNOSIS — M792 Neuralgia and neuritis, unspecified: Secondary | ICD-10-CM | POA: Diagnosis not present

## 2021-03-18 DIAGNOSIS — M7671 Peroneal tendinitis, right leg: Secondary | ICD-10-CM | POA: Diagnosis not present

## 2021-03-18 DIAGNOSIS — M24571 Contracture, right ankle: Secondary | ICD-10-CM | POA: Diagnosis not present

## 2021-03-18 DIAGNOSIS — M24572 Contracture, left ankle: Secondary | ICD-10-CM | POA: Diagnosis not present

## 2021-03-20 DIAGNOSIS — Z23 Encounter for immunization: Secondary | ICD-10-CM | POA: Diagnosis not present

## 2021-03-26 NOTE — Telephone Encounter (Signed)
Left message for pt to call back but adv that I had sent her concerns about Crestor to PharmD and that I wanted to review that w her on the phone.  Adv I would send the info back to her via her MyChart message and that I will also route to Dr. Marlou Porch who is working in the hospital this week.

## 2021-03-27 DIAGNOSIS — Z6823 Body mass index (BMI) 23.0-23.9, adult: Secondary | ICD-10-CM | POA: Diagnosis not present

## 2021-03-27 DIAGNOSIS — I10 Essential (primary) hypertension: Secondary | ICD-10-CM | POA: Diagnosis not present

## 2021-03-27 NOTE — Telephone Encounter (Signed)
   Pt is returning call, she gave phone # [phone] for callback

## 2021-03-27 NOTE — Telephone Encounter (Signed)
Attempted to call patient back. Left message for patient that information has been sent to her through [place] and advised to call back if she has any questions or concerns.

## 2021-04-01 DIAGNOSIS — Z20822 Contact with and (suspected) exposure to covid-19: Secondary | ICD-10-CM | POA: Diagnosis not present

## 2021-04-03 NOTE — Progress Notes (Signed)
Office Visit Note  Patient: Theresa Graham             Date of Birth: 1951/07/14           MRN: [id number]             PCP: Carol Ada, MD Referring: Carol Ada, MD Visit Date: 04/17/2021 Occupation: '@GUAROCC'$ @  Subjective:  Medication management   History of Present Illness: KORINNA BUMGARDNER is a 70 y.o. female with a history of systemic and discoid lupus.  She has been taking hydroxychloroquine 200 mg 1 tablet every other day.  She has not had any recent episode of rash.  She has intermittent raynaud's phenominon during the winter months.  She denies any history of oral ulcers, nasal ulcers, malar rash, lymphadenopathy.  She still gets some photosensitivity.  He has some discomfort in her knee joints off and on.  She also has some lower back pain off and on.  There is no history of joint swelling.  She had recent DEXA scan by her GYN and her BMD has improved.  She will continue Fosamax until her next follow-up bone density per patient.  Activities of Daily Living:  Patient reports morning stiffness for 5 minutes.   Patient Denies nocturnal pain.  Difficulty dressing/grooming: Denies Difficulty climbing stairs: Denies Difficulty getting out of chair: Denies Difficulty using hands for taps, buttons, cutlery, and/or writing: Denies  Review of Systems  Constitutional:  Positive for fatigue. Negative for night sweats, weight gain and weight loss.  HENT:  Negative for mouth sores, trouble swallowing, trouble swallowing, mouth dryness and nose dryness.   Eyes:  Negative for pain, redness, visual disturbance and dryness.  Respiratory:  Negative for cough, shortness of breath and difficulty breathing.   Cardiovascular:  Negative for chest pain, palpitations, hypertension, irregular heartbeat and swelling in legs/feet.  Gastrointestinal:  Positive for constipation. Negative for blood in stool and diarrhea.  Endocrine: Negative for excessive thirst and increased urination.  Genitourinary:   Negative for difficulty urinating and vaginal dryness.  Musculoskeletal:  Positive for joint pain, joint pain and morning stiffness. Negative for joint swelling, myalgias, muscle weakness, muscle tenderness and myalgias.  Skin:  Positive for color change and sensitivity to sunlight. Negative for rash, hair loss, skin tightness and ulcers.  Allergic/Immunologic: Negative for susceptible to infections.  Neurological:  Negative for dizziness, numbness, memory loss, night sweats and weakness.  Hematological:  Negative for bruising/bleeding tendency and swollen glands.  Psychiatric/Behavioral:  Positive for sleep disturbance. Negative for depressed mood. The patient is not nervous/anxious.    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   Age-related osteoporosis without current pathological fracture 08/04/2016   Systemic lupus erythematosus ([place]) 08/01/2016   Vitamin D deficiency 08/01/2016   History of IBS 08/01/2016   Plantar fasciitis 08/01/2016   Raynaud's disease without gangrene 08/01/2016   History of hypertension 08/01/2016   High risk medication use 06/18/2016   Hypertension    HSV (herpes simplex virus) infection    Headache(784.0)     Past Medical History:  Diagnosis Date   HSV (herpes simplex virus) infection    Hydrosalpinx    Hypertension    IBS (irritable bowel syndrome)    Insomnia    Lupus (HCC)    sle and discoid lupus   Lupus ([place])    Osteoporosis 2019   T score -2.8 statistically improved from  prior study   Prediabetes    Raynaud's disease    Spinal stenosis    per patient    Systemic lupus erythematosus ([place])     Family History  Problem Relation Age of Onset   Cancer Mother    Diabetes Mother    Hypertension Mother    Thyroid disease Mother    Heart disease Mother    Breast cancer Mother        Age 86   Diabetes Father    Hypertension Father    Cancer  Father        [place]   Heart disease Father    Crohn's disease Sister    Hypertension Sister    Stroke Sister    Cancer Sister        Intestinal Ca   Colitis Sister    Sarcoidosis Sister    Cancer Sister    Crohn's disease Sister    Hypertension Brother    Hypertension Maternal Grandmother    Diabetes Maternal Grandmother    Hypertension Paternal Grandmother    Diabetes Paternal Grandmother    Diabetes Paternal Grandfather    Healthy Son    Rheum arthritis Son    Past Surgical History:  Procedure Laterality Date   ABDOMINAL HYSTERECTOMY  1987   TAH   back injection  05/2020   FOOT SURGERY Left 2010   hysterectomy  1987   ROTATOR CUFF REPAIR  2006   SHOULDER SURGERY FOR BONE SPUR  2010   STRESS FRACTURE LEG  08/2007   TENDON RELEASE Left 01/07/2018   TUBAL LIGATION  1978   WRIST SURGERY Right 09/23/2018   tendon release    WRIST SURGERY Left 12/2017   Social History   Social History Narrative   Not on file   Immunization History  Administered Date(s) Administered   Influenza Split 05/05/2012   Influenza,inj,Quad PF,6+ Mos 05/13/2013, 05/17/2014   PFIZER(Purple Top)SARS-COV-2 Vaccination 08/16/2019, 09/06/2019, 03/23/2020, 10/22/2020     Objective: Vital Signs: BP 113/69 (BP Location: Left Arm, Patient Position: Sitting, Cuff Size: Normal)   Pulse 73   Resp 16   Ht 5' 4.5" (1.638 m)   Wt 142 lb (64.4 kg)   BMI 24.00 kg/m    Physical Exam Vitals and nursing note reviewed.  Constitutional:      Appearance: She is well-developed.  HENT:     Head: Normocephalic and atraumatic.  Eyes:     Conjunctiva/sclera: Conjunctivae normal.  Cardiovascular:     Rate and Rhythm: Normal rate and regular rhythm.     Heart sounds: Normal heart sounds.  Pulmonary:     Effort: Pulmonary effort is normal.     Breath sounds: Normal breath sounds.  Abdominal:     General: Bowel sounds are normal.     Palpations: Abdomen is soft.  Musculoskeletal:     Cervical back:  Normal range of motion.  Lymphadenopathy:     Cervical: No cervical adenopathy.  Skin:    General: Skin is warm and dry.     Capillary Refill: Capillary refill takes less than 2 seconds.  Neurological:     Mental Status: She is alert and oriented to person, place, and time.  Psychiatric:        Behavior: Behavior normal.     Musculoskeletal Exam: C-spine thoracic and lumbar spine were in good range of motion.  She has some discomfort range of motion lumbar spine.  Shoulder joints, elbow joints, wrist joints, MCPs PIPs and DIPs with good  range of motion with no synovitis.  She has some thickening of the right second and third MCP joint.  Hip joints, knee joints, ankles, MTPs and PIPs with good range of motion with no synovitis.  CDAI Exam: CDAI Score: -- Patient Global: --; Provider Global: -- Swollen: --; Tender: -- Joint Exam 04/17/2021   No joint exam has been documented for this visit   There is currently no information documented on the homunculus. Go to the Rheumatology activity and complete the homunculus joint exam.  Investigation: No additional findings.  Imaging: No results found.  Recent Labs: Lab Results  Component Value Date   WBC 5.3 05/31/2020   HGB 14.4 05/31/2020   PLT 290 05/31/2020   NA 137 05/31/2020   K 4.0 05/31/2020   CL 100 05/31/2020   CO2 25 05/31/2020   GLUCOSE 101 05/31/2020   BUN 19 05/31/2020   CREATININE 0.96 05/31/2020   BILITOT 0.9 05/31/2020   ALKPHOS 69 08/04/2016   AST 11 05/31/2020   ALT 17 05/31/2020   PROT 7.2 05/31/2020   ALBUMIN 4.3 08/04/2016   CALCIUM 10.0 05/31/2020   GFRAA 70 05/31/2020    Speciality Comments: PLQ Eye Exam: 06/18/2020 @ [facility] follow up in 1 year  Procedures:  No procedures performed Allergies: Patient has no known allergies.   Assessment / Plan:     Visit Diagnoses: Other systemic lupus erythematosus with other organ involvement (HCC) - Positive ANA, positive Smith, positive  RNP, positive Ro, positive Raynauds, positive photosensitivity, positive arthritis, DLE: She is clinically doing well.  She has mild Raynauds symptoms and arthralgias but no synovitis was noted.  We will check double-stranded DNA, complements today.  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  High risk medication use - Plaquenil 200 mg 1 tablet by mouth every other day.  PLQ Eye Exam: 06/18/2020.  Will obtain labs today to include CBC with differential, CMP with GFR.  She will be getting eye examination this year.  Recommendations regarding immunization was placed in the AVS plan  Hypopigmentation - Due to previous rash.   Raynaud's syndrome without gangrene - norvasc 5 mg 1 tablet by mouth daily.  DDD (degenerative disc disease), lumbar - According to the patient she had a MRI of the lumbar spine in fall 2021-no records in epic.  She continues to have some discomfort in her lower back off and on.  I gave her a handout on core strengthening exercises.  Age-related osteoporosis without current pathological fracture - DEXA  On 08/01/20 which revealed an improvement in the spine from -2.8 to -1.6. fosamax 70 mg 1 tablet by mouth once weekly.  Her DEXA is followed by her GYN.  Vitamin D deficiency-she has been taking vitamin D.  Per her request vitamin D will be checked today.  History of hypertension-her blood pressure is normal today.  History of IBS-she is on Linzess for constipation.  Orders: No orders of the defined types were placed in this encounter.  No orders of the defined types were placed in this encounter.   Follow-Up Instructions: Return in about 5 months (around 09/17/2021) for Systemic lupus.   Bo Merino, MD  Note - This record has been created using Editor, commissioning.  Chart creation errors have been sought, but may not always  have been located. Such creation errors do not reflect on  the standard of medical care.

## 2021-04-08 ENCOUNTER — Ambulatory Visit: Payer: Medicare Other | Admitting: Rheumatology

## 2021-04-09 DIAGNOSIS — Z20822 Contact with and (suspected) exposure to covid-19: Secondary | ICD-10-CM | POA: Diagnosis not present

## 2021-04-17 ENCOUNTER — Encounter: Payer: Self-pay | Admitting: Rheumatology

## 2021-04-17 ENCOUNTER — Other Ambulatory Visit: Payer: Self-pay

## 2021-04-17 ENCOUNTER — Ambulatory Visit (INDEPENDENT_AMBULATORY_CARE_PROVIDER_SITE_OTHER): Payer: Medicare Other | Admitting: Rheumatology

## 2021-04-17 VITALS — BP 113/69 | HR 73 | Resp 16 | Ht 64.5 in | Wt 142.0 lb

## 2021-04-17 DIAGNOSIS — D225 Melanocytic nevi of trunk: Secondary | ICD-10-CM | POA: Diagnosis not present

## 2021-04-17 DIAGNOSIS — M5136 Other intervertebral disc degeneration, lumbar region: Secondary | ICD-10-CM | POA: Diagnosis not present

## 2021-04-17 DIAGNOSIS — M810 Age-related osteoporosis without current pathological fracture: Secondary | ICD-10-CM

## 2021-04-17 DIAGNOSIS — L819 Disorder of pigmentation, unspecified: Secondary | ICD-10-CM | POA: Diagnosis not present

## 2021-04-17 DIAGNOSIS — D2261 Melanocytic nevi of right upper limb, including shoulder: Secondary | ICD-10-CM | POA: Diagnosis not present

## 2021-04-17 DIAGNOSIS — I7300 Raynaud's syndrome without gangrene: Secondary | ICD-10-CM | POA: Diagnosis not present

## 2021-04-17 DIAGNOSIS — B078 Other viral warts: Secondary | ICD-10-CM | POA: Diagnosis not present

## 2021-04-17 DIAGNOSIS — Z8719 Personal history of other diseases of the digestive system: Secondary | ICD-10-CM

## 2021-04-17 DIAGNOSIS — L930 Discoid lupus erythematosus: Secondary | ICD-10-CM

## 2021-04-17 DIAGNOSIS — M3219 Other organ or system involvement in systemic lupus erythematosus: Secondary | ICD-10-CM | POA: Diagnosis not present

## 2021-04-17 DIAGNOSIS — Z8679 Personal history of other diseases of the circulatory system: Secondary | ICD-10-CM | POA: Diagnosis not present

## 2021-04-17 DIAGNOSIS — D2271 Melanocytic nevi of right lower limb, including hip: Secondary | ICD-10-CM | POA: Diagnosis not present

## 2021-04-17 DIAGNOSIS — L720 Epidermal cyst: Secondary | ICD-10-CM | POA: Diagnosis not present

## 2021-04-17 DIAGNOSIS — E559 Vitamin D deficiency, unspecified: Secondary | ICD-10-CM

## 2021-04-17 DIAGNOSIS — L821 Other seborrheic keratosis: Secondary | ICD-10-CM | POA: Diagnosis not present

## 2021-04-17 DIAGNOSIS — Z79899 Other long term (current) drug therapy: Secondary | ICD-10-CM | POA: Diagnosis not present

## 2021-04-17 DIAGNOSIS — M654 Radial styloid tenosynovitis [de Quervain]: Secondary | ICD-10-CM

## 2021-04-17 DIAGNOSIS — D485 Neoplasm of uncertain behavior of skin: Secondary | ICD-10-CM | POA: Diagnosis not present

## 2021-04-17 DIAGNOSIS — Z85828 Personal history of other malignant neoplasm of skin: Secondary | ICD-10-CM | POA: Diagnosis not present

## 2021-04-17 DIAGNOSIS — M7062 Trochanteric bursitis, left hip: Secondary | ICD-10-CM

## 2021-04-17 MED ORDER — HYDROXYCHLOROQUINE SULFATE 200 MG PO TABS: 200.0000 mg | ORAL_TABLET | ORAL | 0 refills | Status: DC

## 2021-04-17 NOTE — Addendum Note (Signed)
Addended by: Bo Merino on: 04/17/2021 12:24 PM   Modules accepted: Orders

## 2021-04-17 NOTE — Patient Instructions (Addendum)
Vaccines You are taking a medication(s) that can suppress your immune system.  The following immunizations are recommended: Flu annually Covid-19  Td/Tdap (tetanus, diphtheria, pertussis) every 10 years Pneumonia (Prevnar 15 then Pneumovax 23 at least 1 year apart.  Alternatively, can take Prevnar 20 without needing additional dose) Shingrix: 2 doses from 4 weeks to 6 months apart  Please check with your PCP to make sure you are up to date.    Back Exercises The following exercises strengthen the muscles that help to support the trunk (torso) and back. They also help to keep the lower back flexible. Doing these exercises can help to prevent or lessen existing low back pain. If you have back pain or discomfort, try doing these exercises 2-3 times each day or as told by your health care provider. As your pain improves, do them once each day, but increase the number of times that you repeat the steps for each exercise (do more repetitions). To prevent the recurrence of back pain, continue to do these exercises once each day or as told by your health care provider. Do exercises exactly as told by your health care provider and adjust them as directed. It is normal to feel mild stretching, pulling, tightness, or discomfort as you do these exercises, but you should stop right away if you feel sudden pain or your pain gets worse. Exercises Single knee to chest Repeat these steps 3-5 times for each leg: Lie on your back on a firm bed or the floor with your legs extended. Bring one knee to your chest. Your other leg should stay extended and in contact with the floor. Hold your knee in place by grabbing your knee or thigh with both hands and hold. Pull on your knee until you feel a gentle stretch in your lower back or buttocks. Hold the stretch for 10-30 seconds. Slowly release and straighten your leg. Pelvic tilt Repeat these steps 5-10 times: Lie on your back on a firm bed or the floor with your  legs extended. Bend your knees so they are pointing toward the ceiling and your feet are flat on the floor. Tighten your lower abdominal muscles to press your lower back against the floor. This motion will tilt your pelvis so your tailbone points up toward the ceiling instead of pointing to your feet or the floor. With gentle tension and even breathing, hold this position for 5-10 seconds. Cat-cow Repeat these steps until your lower back becomes more flexible: Get into a hands-and-knees position on a firm bed or the floor. Keep your hands under your shoulders, and keep your knees under your hips. You may place padding under your knees for comfort. Let your head hang down toward your chest. Contract your abdominal muscles and point your tailbone toward the floor so your lower back becomes rounded like the back of a cat. Hold this position for 5 seconds. Slowly lift your head, let your abdominal muscles relax, and point your tailbone up toward the ceiling so your back forms a sagging arch like the back of a cow. Hold this position for 5 seconds.  Press-ups Repeat these steps 5-10 times: Lie on your abdomen (face-down) on a firm bed or the floor. Place your palms near your head, about shoulder-width apart. Keeping your back as relaxed as possible and keeping your hips on the floor, slowly straighten your arms to raise the top half of your body and lift your shoulders. Do not use your back muscles to raise your upper  torso. You may adjust the placement of your hands to make yourself more comfortable. Hold this position for 5 seconds while you keep your back relaxed. Slowly return to lying flat on the floor.  Bridges Repeat these steps 10 times: Lie on your back on a firm bed or the floor. Bend your knees so they are pointing toward the ceiling and your feet are flat on the floor. Your arms should be flat at your sides, next to your body. Tighten your buttocks muscles and lift your buttocks off  the floor until your waist is at almost the same height as your knees. You should feel the muscles working in your buttocks and the back of your thighs. If you do not feel these muscles, slide your feet 1-2 inches (2.5-5 cm) farther away from your buttocks. Hold this position for 3-5 seconds. Slowly lower your hips to the starting position, and allow your buttocks muscles to relax completely. If this exercise is too easy, try doing it with your arms crossed over your chest. Abdominal crunches Repeat these steps 5-10 times: Lie on your back on a firm bed or the floor with your legs extended. Bend your knees so they are pointing toward the ceiling and your feet are flat on the floor. Cross your arms over your chest. Tip your chin slightly toward your chest without bending your neck. Tighten your abdominal muscles and slowly raise your torso high enough to lift your shoulder blades a tiny bit off the floor. Avoid raising your torso higher than that because it can put too much stress on your lower back and does not help to strengthen your abdominal muscles. Slowly return to your starting position. Back lifts Repeat these steps 5-10 times: Lie on your abdomen (face-down) with your arms at your sides, and rest your forehead on the floor. Tighten the muscles in your legs and your buttocks. Slowly lift your chest off the floor while you keep your hips pressed to the floor. Keep the back of your head in line with the curve in your back. Your eyes should be looking at the floor. Hold this position for 3-5 seconds. Slowly return to your starting position. Contact a health care provider if: Your back pain or discomfort gets much worse when you do an exercise. Your worsening back pain or discomfort does not lessen within 2 hours after you exercise. If you have any of these problems, stop doing these exercises right away. Do not do them again unless your health care provider says that you can. Get help  right away if: You develop sudden, severe back pain. If this happens, stop doing the exercises right away. Do not do them again unless your health care provider says that you can. This information is not intended to replace advice given to you by your health care provider. Make sure you discuss any questions you have with your health care provider. Document Revised: 09/19/2020 Document Reviewed: 09/19/2020 Elsevier Patient Education  [place].

## 2021-04-18 LAB — COMPLETE METABOLIC PANEL WITH GFR
AG Ratio: 1.6 (calc) (ref 1.0–2.5)
ALT: 21 U/L (ref 6–29)
AST: 13 U/L (ref 10–35)
Albumin: 5 g/dL (ref 3.6–5.1)
Alkaline phosphatase (APISO): 55 U/L (ref 37–153)
BUN: 20 mg/dL (ref 7–25)
CO2: 24 mmol/L (ref 20–32)
Calcium: 10.4 mg/dL (ref 8.6–10.4)
Chloride: 100 mmol/L (ref 98–110)
Creat: 0.95 mg/dL (ref 0.60–1.00)
Globulin: 3.2 g/dL (calc) (ref 1.9–3.7)
Glucose, Bld: 81 mg/dL (ref 65–99)
Potassium: 4.2 mmol/L (ref 3.5–5.3)
Sodium: 138 mmol/L (ref 135–146)
Total Bilirubin: 0.9 mg/dL (ref 0.2–1.2)
Total Protein: 8.2 g/dL — ABNORMAL HIGH (ref 6.1–8.1)
eGFR: 64 mL/min/{1.73_m2} (ref >=60)

## 2021-04-18 LAB — CBC WITH DIFFERENTIAL/PLATELET
Absolute Monocytes: 589 cells/uL (ref 200–950)
Basophils Absolute: 77 cells/uL (ref 0–200)
Basophils Relative: 1.2 %
Eosinophils Absolute: 218 cells/uL (ref 15–500)
Eosinophils Relative: 3.4 %
HCT: 44.2 % (ref 35.0–45.0)
Hemoglobin: 14.4 g/dL (ref 11.7–15.5)
Lymphs Abs: 1210 cells/uL (ref 850–3900)
MCH: 28.6 pg (ref 27.0–33.0)
MCHC: 32.6 g/dL (ref 32.0–36.0)
MCV: 87.7 fL (ref 80.0–100.0)
MPV: 9.9 fL (ref 7.5–12.5)
Monocytes Relative: 9.2 %
Neutro Abs: 4307 cells/uL (ref 1500–7800)
Neutrophils Relative %: 67.3 %
Platelets: 284 10*3/uL (ref 140–400)
RBC: 5.04 10*6/uL (ref 3.80–5.10)
RDW: 12.8 % (ref 11.0–15.0)
Total Lymphocyte: 18.9 %
WBC: 6.4 10*3/uL (ref 3.8–10.8)

## 2021-04-18 LAB — ANTI-DNA ANTIBODY, DOUBLE-STRANDED: ds DNA Ab: <1 IU/mL

## 2021-04-18 LAB — URINALYSIS, ROUTINE W REFLEX MICROSCOPIC
Bilirubin Urine: NEGATIVE
Glucose, UA: NEGATIVE
Hgb urine dipstick: NEGATIVE
Leukocytes,Ua: NEGATIVE
Nitrite: NEGATIVE
Protein, ur: NEGATIVE
Specific Gravity, Urine: 1.013 (ref 1.001–1.035)
pH: 6 (ref 5.0–8.0)

## 2021-04-18 LAB — SEDIMENTATION RATE: Sed Rate: 2 mm/h (ref 0–30)

## 2021-04-18 LAB — C3 AND C4
C3 Complement: 148 mg/dL (ref 83–193)
C4 Complement: 30 mg/dL (ref 15–57)

## 2021-04-18 LAB — VITAMIN D 25 HYDROXY (VIT D DEFICIENCY, FRACTURES): Vit D, 25-Hydroxy: 40 ng/mL (ref 30–100)

## 2021-04-18 NOTE — Progress Notes (Signed)
CBC, CMP, UA, sed rate, vitamin D, complements, double-stranded DNA and vitamin D are all within normal limits.

## 2021-04-19 DIAGNOSIS — Z6823 Body mass index (BMI) 23.0-23.9, adult: Secondary | ICD-10-CM | POA: Diagnosis not present

## 2021-04-19 DIAGNOSIS — I10 Essential (primary) hypertension: Secondary | ICD-10-CM | POA: Diagnosis not present

## 2021-04-22 DIAGNOSIS — E7800 Pure hypercholesterolemia, unspecified: Secondary | ICD-10-CM | POA: Diagnosis not present

## 2021-04-22 DIAGNOSIS — I10 Essential (primary) hypertension: Secondary | ICD-10-CM | POA: Diagnosis not present

## 2021-04-22 DIAGNOSIS — R7303 Prediabetes: Secondary | ICD-10-CM | POA: Diagnosis not present

## 2021-04-22 DIAGNOSIS — M818 Other osteoporosis without current pathological fracture: Secondary | ICD-10-CM | POA: Diagnosis not present

## 2021-04-22 DIAGNOSIS — Z23 Encounter for immunization: Secondary | ICD-10-CM | POA: Diagnosis not present

## 2021-04-22 DIAGNOSIS — M329 Systemic lupus erythematosus, unspecified: Secondary | ICD-10-CM | POA: Diagnosis not present

## 2021-04-29 DIAGNOSIS — M2550 Pain in unspecified joint: Secondary | ICD-10-CM | POA: Diagnosis not present

## 2021-04-29 DIAGNOSIS — Z6823 Body mass index (BMI) 23.0-23.9, adult: Secondary | ICD-10-CM | POA: Diagnosis not present

## 2021-05-06 DIAGNOSIS — Z6823 Body mass index (BMI) 23.0-23.9, adult: Secondary | ICD-10-CM | POA: Diagnosis not present

## 2021-05-06 DIAGNOSIS — I10 Essential (primary) hypertension: Secondary | ICD-10-CM | POA: Diagnosis not present

## 2021-05-13 DIAGNOSIS — J329 Chronic sinusitis, unspecified: Secondary | ICD-10-CM | POA: Diagnosis not present

## 2021-05-13 DIAGNOSIS — H6123 Impacted cerumen, bilateral: Secondary | ICD-10-CM | POA: Insufficient documentation

## 2021-05-13 DIAGNOSIS — H903 Sensorineural hearing loss, bilateral: Secondary | ICD-10-CM | POA: Diagnosis not present

## 2021-05-15 ENCOUNTER — Other Ambulatory Visit: Payer: Medicare Other

## 2021-05-23 DIAGNOSIS — Z6823 Body mass index (BMI) 23.0-23.9, adult: Secondary | ICD-10-CM | POA: Diagnosis not present

## 2021-05-23 DIAGNOSIS — M2550 Pain in unspecified joint: Secondary | ICD-10-CM | POA: Diagnosis not present

## 2021-05-29 DIAGNOSIS — Z20828 Contact with and (suspected) exposure to other viral communicable diseases: Secondary | ICD-10-CM | POA: Diagnosis not present

## 2021-06-11 DIAGNOSIS — I10 Essential (primary) hypertension: Secondary | ICD-10-CM | POA: Diagnosis not present

## 2021-06-11 DIAGNOSIS — Z6822 Body mass index (BMI) 22.0-22.9, adult: Secondary | ICD-10-CM | POA: Diagnosis not present

## 2021-06-17 DIAGNOSIS — N951 Menopausal and female climacteric states: Secondary | ICD-10-CM | POA: Diagnosis not present

## 2021-06-18 ENCOUNTER — Other Ambulatory Visit: Payer: Self-pay | Admitting: Rheumatology

## 2021-06-18 ENCOUNTER — Telehealth: Payer: Self-pay

## 2021-06-18 NOTE — Telephone Encounter (Signed)
Left message to advise patient she is due to update her PLQ eye exam. Patient advised to call the office to let us know when it is scheduled for.

## 2021-06-18 NOTE — Telephone Encounter (Signed)
Noted  

## 2021-06-18 NOTE — Telephone Encounter (Signed)
Please advise patient to get an eye exam as soon as possible.

## 2021-06-18 NOTE — Telephone Encounter (Signed)
Next Visit: 09/16/2021  Last Visit: 04/17/2021  Labs: 04/17/2021 CBC, CMP, UA, sed rate, vitamin D, complements, double-stranded DNA and vitamin D are all within normal limits.  Eye exam: 06/18/2020.    Current Dose per office note 04/17/2021: Plaquenil 200 mg 1 tablet by mouth every other day  GN:PHQNE systemic lupus erythematosus with other organ involvement   Last Fill: 04/17/2021  Okay to refill Plaquenil?

## 2021-06-18 NOTE — Telephone Encounter (Signed)
Patient left a voicemail stating she is scheduled for her Plaquenil eye exam on Monday, June 24, 2021 with Dr. Melissa Noon.  Patient states she will have their office fax the results.

## 2021-06-24 ENCOUNTER — Encounter: Payer: Self-pay | Admitting: Rheumatology

## 2021-06-24 DIAGNOSIS — H52203 Unspecified astigmatism, bilateral: Secondary | ICD-10-CM | POA: Diagnosis not present

## 2021-06-24 DIAGNOSIS — H25813 Combined forms of age-related cataract, bilateral: Secondary | ICD-10-CM | POA: Diagnosis not present

## 2021-06-24 DIAGNOSIS — Z79899 Other long term (current) drug therapy: Secondary | ICD-10-CM | POA: Diagnosis not present

## 2021-06-26 DIAGNOSIS — I10 Essential (primary) hypertension: Secondary | ICD-10-CM | POA: Diagnosis not present

## 2021-06-26 DIAGNOSIS — Z6823 Body mass index (BMI) 23.0-23.9, adult: Secondary | ICD-10-CM | POA: Diagnosis not present

## 2021-06-28 DIAGNOSIS — Z1231 Encounter for screening mammogram for malignant neoplasm of breast: Secondary | ICD-10-CM | POA: Diagnosis not present

## 2021-06-28 DIAGNOSIS — Z20822 Contact with and (suspected) exposure to covid-19: Secondary | ICD-10-CM | POA: Diagnosis not present

## 2021-07-01 ENCOUNTER — Encounter: Payer: Self-pay | Admitting: Obstetrics and Gynecology

## 2021-07-02 DIAGNOSIS — B078 Other viral warts: Secondary | ICD-10-CM | POA: Diagnosis not present

## 2021-07-02 DIAGNOSIS — D1801 Hemangioma of skin and subcutaneous tissue: Secondary | ICD-10-CM | POA: Diagnosis not present

## 2021-07-02 DIAGNOSIS — L819 Disorder of pigmentation, unspecified: Secondary | ICD-10-CM | POA: Diagnosis not present

## 2021-07-02 DIAGNOSIS — L858 Other specified epidermal thickening: Secondary | ICD-10-CM | POA: Diagnosis not present

## 2021-07-09 DIAGNOSIS — Z20822 Contact with and (suspected) exposure to covid-19: Secondary | ICD-10-CM | POA: Diagnosis not present

## 2021-07-31 ENCOUNTER — Other Ambulatory Visit: Payer: Self-pay

## 2021-07-31 ENCOUNTER — Other Ambulatory Visit: Payer: Medicare Other | Admitting: *Deleted

## 2021-07-31 DIAGNOSIS — Z79899 Other long term (current) drug therapy: Secondary | ICD-10-CM

## 2021-07-31 DIAGNOSIS — I7090 Unspecified atherosclerosis: Secondary | ICD-10-CM

## 2021-07-31 DIAGNOSIS — R931 Abnormal findings on diagnostic imaging of heart and coronary circulation: Secondary | ICD-10-CM | POA: Diagnosis not present

## 2021-08-01 LAB — LIPID PANEL
Chol/HDL Ratio: 1.6 ratio (ref 0.0–4.4)
Cholesterol, Total: 184 mg/dL (ref 100–199)
HDL: 118 mg/dL (ref >39)
LDL Chol Calc (NIH): 57 mg/dL (ref 0–99)
Triglycerides: 41 mg/dL (ref 0–149)
VLDL Cholesterol Cal: 9 mg/dL (ref 5–40)

## 2021-08-01 LAB — ALT: ALT: 30 IU/L (ref 0–32)

## 2021-08-28 DIAGNOSIS — Z6824 Body mass index (BMI) 24.0-24.9, adult: Secondary | ICD-10-CM | POA: Diagnosis not present

## 2021-08-28 DIAGNOSIS — M2550 Pain in unspecified joint: Secondary | ICD-10-CM | POA: Diagnosis not present

## 2021-09-02 NOTE — Progress Notes (Signed)
Office Visit Note  Patient: Theresa Graham             Date of Birth: [birth year]           MRN: [id number]             PCP: Carol Ada, MD Referring: Carol Ada, MD Visit Date: 09/16/2021 Occupation: @GUAROCC @  Subjective:  Nocturnal muscle cramps   History of Present Illness: Theresa Graham is a 71 y.o. female with history of systemic lupus erythematosus, discoid lupus, and osteoporosis.  Patient is taking Plaquenil 200 mg 1 tablet by mouth every other day.  She continues to tolerate Plaquenil without any side effects and has not missed any doses recently.  She denies any signs or symptoms of a systemic lupus flare.  She has not had any recent discoid lesions or facial rashes.  She denies any oral or nasal ulcerations.  She has some eye dryness but no mouth dryness.  She denies any swollen lymph nodes or increased fatigue.  She denies any shortness of breath, pleuritic chest pain, or palpitations.  She has intermittent symptoms of Raynaud's but denies any digital ulcerations.  She has not noticed any hair loss.  She experiences stiffness in both hands intermittently.  She has also been noticing more muscle cramps at night.  She has occasional discomfort in the left knee joint and uses Voltaren gel topically as needed for symptomatic relief.  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  She tries to remain active and eat healthy.    Activities of Daily Living:  Patient reports morning stiffness for a few minutes.   Patient Denies nocturnal pain.  Difficulty dressing/grooming: Denies Difficulty climbing stairs: Denies Difficulty getting out of chair: Denies Difficulty using hands for taps, buttons, cutlery, and/or writing: Denies  Review of Systems  Constitutional:  Positive for fatigue.  HENT:  Negative for mouth sores, mouth dryness and nose dryness.   Eyes:  Positive for dryness. Negative for pain and itching.  Respiratory:  Negative for  shortness of breath and difficulty breathing.   Cardiovascular:  Negative for chest pain and palpitations.  Gastrointestinal:  Positive for constipation. Negative for blood in stool and diarrhea.  Endocrine: Negative for increased urination.  Genitourinary:  Negative for difficulty urinating.  Musculoskeletal:  Positive for joint pain, joint pain, joint swelling, myalgias, morning stiffness and myalgias. Negative for muscle tenderness.  Skin:  Negative for color change, rash and redness.  Allergic/Immunologic: Negative for susceptible to infections.  Neurological:  Negative for dizziness, numbness, headaches, memory loss and weakness.  Hematological:  Positive for bruising/bleeding tendency.  Psychiatric/Behavioral:  Negative for confusion.    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   Age-related osteoporosis without current pathological fracture 08/04/2016   Systemic lupus erythematosus ([place]) 08/01/2016   Vitamin D deficiency 08/01/2016   History of IBS 08/01/2016   Plantar fasciitis 08/01/2016   Raynaud's disease without gangrene 08/01/2016   History of hypertension 08/01/2016   High risk medication use 06/18/2016   Hypertension    HSV (herpes simplex virus) infection    Headache(784.0)     Past Medical History:  Diagnosis Date   HSV (herpes simplex virus) infection    Hydrosalpinx    Hypertension    IBS (irritable bowel syndrome)    Insomnia    Lupus ([place])  sle and discoid lupus   Lupus ([place])    Osteoporosis 2019   T score -2.8 statistically improved from prior study   Prediabetes    Raynaud's disease    Spinal stenosis    per patient    Systemic lupus erythematosus ([place])     Family History  Problem Relation Age of Onset   Cancer Mother    Diabetes Mother    Hypertension Mother    Thyroid disease Mother    Heart disease Mother    Breast cancer Mother         Age 33   Diabetes Father    Hypertension Father    Cancer Father        COLON   Heart disease Father    Crohn's disease Sister    Hypertension Sister    Stroke Sister    Cancer Sister        Intestinal Ca   Colitis Sister    Sarcoidosis Sister    Cancer Sister    Crohn's disease Sister    Hypertension Brother    Hypertension Maternal Grandmother    Diabetes Maternal Grandmother    Hypertension Paternal Grandmother    Diabetes Paternal Grandmother    Diabetes Paternal Grandfather    Healthy Son    Rheum arthritis Son    Past Surgical History:  Procedure Laterality Date   ABDOMINAL HYSTERECTOMY  1987   TAH   back injection  05/2020   FOOT SURGERY Left 2010   hysterectomy  1987   ROTATOR CUFF REPAIR  2006   SHOULDER SURGERY FOR BONE SPUR  2010   STRESS FRACTURE LEG  08/2007   TENDON RELEASE Left 01/07/2018   TUBAL LIGATION  1978   WRIST SURGERY Right 09/23/2018   tendon release    WRIST SURGERY Left 12/2017   Social History   Social History Narrative   Not on file   Immunization History  Administered Date(s) Administered   Influenza Split 05/05/2012   Influenza, High Dose Seasonal PF 04/25/2017, 04/19/2018, 03/09/2019   Influenza,inj,Quad PF,6+ Mos 05/13/2013, 05/17/2014   Influenza-Unspecified 04/25/2017   PFIZER(Purple Top)SARS-COV-2 Vaccination 08/16/2019, 09/06/2019, 03/23/2020, 10/22/2020   Zoster Recombinat (Shingrix) 05/27/2017     Objective: Vital Signs: BP 128/72 (BP Location: Left Arm, Patient Position: Sitting, Cuff Size: Normal)    Pulse 65    Ht 5' 4.5" (1.638 m)    Wt 141 lb 12.8 oz (64.3 kg)    BMI 23.96 kg/m    Physical Exam Vitals and nursing note reviewed.  Constitutional:      Appearance: She is well-developed.  HENT:     Head: Normocephalic and atraumatic.  Eyes:     Conjunctiva/sclera: Conjunctivae normal.  Cardiovascular:     Rate and Rhythm: Normal rate and regular rhythm.     Heart sounds: Normal heart sounds.  Pulmonary:      Effort: Pulmonary effort is normal.     Breath sounds: Normal breath sounds.  Abdominal:     General: Bowel sounds are normal.     Palpations: Abdomen is soft.  Musculoskeletal:     Cervical back: Normal range of motion.  Lymphadenopathy:     Cervical: No cervical adenopathy.  Skin:    General: Skin is warm and dry.     Capillary Refill: Capillary refill takes less than 2 seconds.  Neurological:     Mental Status: She is alert and oriented to person, place, and time.  Psychiatric:  Behavior: Behavior normal.     Musculoskeletal Exam: C-spine, thoracic spine, and lumbar spine good ROM.  Shoulder joints, elbow joints, wrist joints, MCPs, PIPs, and DIPs good ROM with no synovitis.  She has PIP and DIP thickening consistent with osteoarthritis of both hands.  Synovial thickening over the right MCP joints but no synovitis was noted.  Complete fist formation bilaterally.  Hip joints, knee joints, and ankle joints have good ROM with no discomfort.  No warmth or effusion of knee joints.  No tenderness or swelling of ankle joints.   CDAI Exam: CDAI Score: -- Patient Global: --; Provider Global: -- Swollen: --; Tender: -- Joint Exam 09/16/2021   No joint exam has been documented for this visit   There is currently no information documented on the homunculus. Go to the Rheumatology activity and complete the homunculus joint exam.  Investigation: No additional findings.  Imaging: No results found.  Recent Labs: Lab Results  Component Value Date   WBC 6.4 04/17/2021   HGB 14.4 04/17/2021   PLT 284 04/17/2021   NA 138 04/17/2021   K 4.2 04/17/2021   CL 100 04/17/2021   CO2 24 04/17/2021   GLUCOSE 81 04/17/2021   BUN 20 04/17/2021   CREATININE 0.95 04/17/2021   BILITOT 0.9 04/17/2021   ALKPHOS 69 08/04/2016   AST 13 04/17/2021   ALT 30 07/31/2021   PROT 8.2 (H) 04/17/2021   ALBUMIN 4.3 08/04/2016   CALCIUM 10.4 04/17/2021   GFRAA 70 05/31/2020    Speciality Comments:  PLQ Eye Exam: 06/24/2021 WNL @ [facility] Ophthalmology follow up in 1 year  Procedures:  No procedures performed Allergies: Patient has no known allergies.    Assessment / Plan:     Visit Diagnoses: Other systemic lupus erythematosus with other organ involvement (HCC) - Positive ANA, positive Smith, positive RNP, positive Ro, positive Raynauds, positive photosensitivity, positive arthritis, DLE: She has not had any signs or symptoms of a systemic lupus flare.  No discoid lesions.  She is clinically doing well taking Plaquenil 200 mg 1 tablet by mouth every other day.  She continues to tolerate Plaquenil without any side effects and has not missed any doses recently.  She is up-to-date on her Plaquenil eye examination.  She experiences some stiffness in both hands but has no synovitis on examination today.  She experiences intermittent symptoms of Raynaud's but no digital ulcerations or signs of gangrene were noted.  She has not had any shortness of breath, pleuritic chest pain, or palpitations.  Her lungs were clear to auscultation on examination today.  No cervical lymphadenopathy was palpable.  Overall her energy level has been stable and she remains active exercising several days per week. Lab work from 04/17/2021 was reviewed today in the office: Double-stranded DNA negative, complements within normal limits, and ESR within normal limits.  The following lab work will be updated today.  She will remain on the current dose of Plaquenil.  She was advised to notify us if she develops signs or symptoms of a flare.  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  She will remain on Plaquenil as prescribed.  She should avoid direct sun exposure and wear sunscreen SPF greater than 50 on a daily  basis.  High risk medication use - Plaquenil 200 mg 1 tablet by mouth  every other day. PLQ Eye Exam: 06/24/2021 WNL @ [facility] Ophthalmology follow up in 1 year.  CBC and CMP were drawn on 04/17/2021.  She is due to update lab work today.  Orders for CBC and CMP were released. - Plan: CBC with Differential/Platelet, COMPLETE METABOLIC PANEL WITH GFR  Hypopigmentation - Due to previous rash.  Raynaud's syndrome without gangrene -She experiences intermittent symptoms of Raynaud's in her fingertips.  She has no digital ulcerations or signs of gangrene on examination today.  She continues to take norvasc 5 mg 1 tablet by mouth daily.  DDD (degenerative disc disease), lumbar -She has not experiencing any increased discomfort in her lower back at this time.  She has been working with a Physiological scientist as well as performing Zumb[employer] for exercise several days a week.  Age-related osteoporosis without current pathological fracture - DEXA on 08/01/20: revealed an improvement in the spine from -2.8 to -1.6.  She remains on fosamax 70 mg 1 tablet by mouth once weekly and vitamin D 5000 units daily.  She is been taking calcium 500 mg daily.  She has not had any recent falls or fractures.  She has been working with a Physiological scientist several days a week for muscle strengthening.  Vitamin D deficiency - She is taking vitamin D 5000 units daily. Vitamin D will be checked today.  Plan: VITAMIN D 25 Hydroxy (Vit-D Deficiency, Fractures)  Other medical conditions are listed as follows:   History of hypertension: BP was 128/72 today.   History of IBS  Muscle cramps -She has been experiencing nocturnal muscle cramps.  She takes magnesium 400 mg daily.  Patient requested to have magnesium checked today.   Plan: Magnesium  Orders: Orders Placed This Encounter  Procedures   Protein / creatinine ratio, urine   CBC with Differential/Platelet   COMPLETE METABOLIC PANEL WITH GFR   ANA   Anti-DNA antibody,  double-stranded   C3 and C4   Sedimentation rate   Magnesium   VITAMIN D 25 Hydroxy (Vit-D Deficiency, Fractures)   No orders of the defined types were placed in this encounter.    Follow-Up Instructions: Return in about 5 months (around 02/13/2022) for Systemic lupus erythematosus.   Ofilia Neas, PA-C  Note - This record has been created using Dragon software.  Chart creation errors have been sought, but may not always  have been located. Such creation errors do not reflect on  the standard of medical care.

## 2021-09-04 NOTE — Progress Notes (Signed)
71 y.o. G89P1001 Widowed [place] American female here for annual breast and pelvic exam.    She is followed for osteoporosis and takes Fosamax.   She is having breakouts of herpes.  Not feeling stressed any more than usual.  Used Valrex in the past but it did not really help a lot.  Not sexually active since her husband passed 5.5 years ago.   Asking about the cyst she has been followed for.   PCP:   Carol Ada, MD  No LMP recorded. Patient has had a hysterectomy.           Sexually active: No.  The current method of family planning is status post hysterectomy.    Exercising: Yes.     Zumba 2x/week, works out at [place]:  no  Health Maintenance: Pap: 07-09-18 Neg, 07-04-15 Neg, 05-17-14 Neg History of abnormal Pap:  no MMG:  06-28-21 Neg/BiRads1 Colonoscopy:  2018 polyps removed;next 10 years BMD:  08-01-20  Result :Osteopenia TDaP:  PCP Gardasil:   n/a HIV: Neg years ago Hep C: 2017 Neg Screening Labs:  PCP   reports that she has never smoked. She has never used smokeless tobacco. She reports current alcohol use of about 2.0 standard drinks per week. She reports that she does not use drugs.  Past Medical History:  Diagnosis Date   HSV (herpes simplex virus) infection    Hydrosalpinx    Hypertension    IBS (irritable bowel syndrome)    Insomnia    Lupus (HCC)    sle and discoid lupus   Lupus ([place])    Osteoporosis 2019   T score -2.8 statistically improved from prior study   Prediabetes    Raynaud's disease    Spinal stenosis    per patient    Systemic lupus erythematosus ([place])     Past Surgical History:  Procedure Laterality Date   ABDOMINAL HYSTERECTOMY  1987   TAH   back injection  05/2020   FOOT SURGERY Left 2010   hysterectomy  1987   ROTATOR CUFF REPAIR  2006   SHOULDER SURGERY FOR BONE SPUR  2010   STRESS FRACTURE LEG  08/2007   TENDON RELEASE Left 01/07/2018   TUBAL LIGATION  1978   WRIST SURGERY Right 09/23/2018   tendon release     WRIST SURGERY Left 12/2017    Current Outpatient Medications  Medication Sig Dispense Refill   alendronate (FOSAMAX) 70 MG tablet TAKE 1 TABLET BY MOUTH ONCE A WEEK. TAKE WITH A FULL  GLASS OF WATER ON AN EMPTY  STOMACH. 12 tablet 4   amLODipine (NORVASC) 5 MG tablet Take 5 mg by mouth daily.       Ascorbic Acid (VITAMIN C CR) 1000 MG TBCR SMARTSIG:1 By Mouth     bimatoprost (LATISSE) 0.03 % ophthalmic solution Apply 1 drop to eye daily.     CALCIUM 600 1500 (600 Ca) MG TABS tablet SMARTSIG:1 By Mouth     cetirizine (ZYRTEC) 10 MG tablet Take 1 tablet by mouth as needed.     Cholecalciferol (VITAMIN D PO) Take 5,000 Units by mouth.     diclofenac sodium (VOLTAREN) 1 % GEL Apply 3 g topically 3 (three) times daily as needed.     hydroxychloroquine (PLAQUENIL) 200 MG tablet TAKE 1 TABLET BY MOUTH  EVERY OTHER DAY 45 tablet 0   linaclotide (LINZESS) 290 MCG CAPS capsule Take by mouth daily before breakfast.      magnesium oxide (MAG-OX) 400 MG tablet Take  400 mg by mouth daily.     montelukast (SINGULAIR) 10 MG tablet Take 10 mg by mouth as needed.     Multiple Vitamin (MULTIVITAMIN) capsule Take 1 capsule by mouth daily.       Probiotic Product (PROBIOTIC-10 PO) Take 1 tablet by mouth daily.     rosuvastatin (CRESTOR) 10 MG tablet 1 tablet     triamcinolone cream (KENALOG) 0.1 % APP AA ON THE SKIN D PRN  0   triamterene-hydrochlorothiazide (MAXZIDE-25) 37.5-25 MG tablet Take 1 tablet by mouth daily.     No current facility-administered medications for this visit.    Family History  Problem Relation Age of Onset   Cancer Mother    Diabetes Mother    Hypertension Mother    Thyroid disease Mother    Heart disease Mother    Breast cancer Mother        Age 73   Diabetes Father    Hypertension Father    Cancer Father        COLON   Heart disease Father    Crohn's disease Sister    Hypertension Sister    Stroke Sister    Cancer Sister        Intestinal Ca   Colitis Sister     Sarcoidosis Sister    Cancer Sister    Crohn's disease Sister    Hypertension Brother    Hypertension Maternal Grandmother    Diabetes Maternal Grandmother    Hypertension Paternal Grandmother    Diabetes Paternal Grandmother    Diabetes Paternal Grandfather    Healthy Son    Rheum arthritis Son     Review of Systems  All other systems reviewed and are negative.  Exam:   BP 124/70    Pulse 64    Ht 5\' 4"  (1.626 m)    Wt 147 lb (66.7 kg)    SpO2 100%    BMI 25.23 kg/m     General appearance: alert, cooperative and appears stated age Head: normocephalic, without obvious abnormality, atraumatic Neck: no adenopathy, supple, symmetrical, trachea midline and thyroid normal to inspection and palpation Lungs: clear to auscultation bilaterally Breasts: normal appearance, no masses or tenderness, No nipple retraction or dimpling, No nipple discharge or bleeding, No axillary adenopathy Heart: regular rate and rhythm Abdomen: soft, non-tender; no masses, no organomegaly Extremities: extremities normal, atraumatic, no cyanosis or edema Skin: skin color, texture, turgor normal. No rashes or lesions Lymph nodes: cervical, supraclavicular, and axillary nodes normal. Neurologic: grossly normal  Pelvic: External genitalia:  no lesions              No abnormal inguinal nodes palpated.              Urethra:  normal appearing urethra with no masses, tenderness or lesions              Bartholins and Skenes: normal                 Vagina: normal appearing vagina with normal color and discharge, no lesions              Cervix: absent              Pap taken: yes Bimanual Exam:  Uterus:  normal size, contour, position, consistency, mobility, non-tender              Adnexa: no mass, fullness, tenderness              Rectal exam:  yes.  Confirms.              Anus:  normal sphincter tone, no lesions  Chaperone was present for exam:  Estill Bamberg, CMA  Assessment:   Well woman visit with gynecologic  exam. GYN exam for high risk Medicare patient.  Status post TAH. Hx osteoporosis.  Now with osteopenia on Fosamax.  Hx HSV.  Resolution of right hydrosalpinx.   Plan: Mammogram screening discussed. Self breast awareness reviewed. Pap and reflex Hr HPV  Guidelines for Calcium, Vitamin D, regular exercise program including cardiovascular and weight bearing exercise. Discussion about her bone density.  Will continue Fosamax weekly for one more year.  BMD in January, 2024.   We discussed HSV, reduction of transmission to partners with condom use and continuous use of Valtrex daily.  She will also instructed in use of Valtrex 500 mg po bid x 3 days prn outbreak.  #90, RF 3.   Potential side effects discussed.  Follow up annually and prn.   Prior US in March, 2022 reviewed and resolution of hydrosalpinx confirmed.  After visit summary provided.   42 min  total time was spent for this patient encounter, including preparation, face-to-face counseling with the patient, coordination of care, and documentation of the encounter.

## 2021-09-05 ENCOUNTER — Ambulatory Visit (INDEPENDENT_AMBULATORY_CARE_PROVIDER_SITE_OTHER): Payer: Medicare Other | Admitting: Obstetrics and Gynecology

## 2021-09-05 ENCOUNTER — Encounter: Payer: Self-pay | Admitting: Obstetrics and Gynecology

## 2021-09-05 ENCOUNTER — Other Ambulatory Visit (HOSPITAL_COMMUNITY)
Admission: RE | Admit: 2021-09-05 | Discharge: 2021-09-05 | Disposition: A | Discharge status: Home / Self Care | Payer: Medicare Other | Source: Ambulatory Visit | Attending: Obstetrics and Gynecology | Admitting: Obstetrics and Gynecology

## 2021-09-05 ENCOUNTER — Other Ambulatory Visit: Payer: Self-pay

## 2021-09-05 VITALS — BP 124/70 | HR 64 | Ht 64.0 in | Wt 147.0 lb

## 2021-09-05 DIAGNOSIS — Z8619 Personal history of other infectious and parasitic diseases: Secondary | ICD-10-CM | POA: Diagnosis not present

## 2021-09-05 DIAGNOSIS — Z01419 Encounter for gynecological examination (general) (routine) without abnormal findings: Secondary | ICD-10-CM

## 2021-09-05 DIAGNOSIS — Z9289 Personal history of other medical treatment: Secondary | ICD-10-CM

## 2021-09-05 DIAGNOSIS — Z9189 Other specified personal risk factors, not elsewhere classified: Secondary | ICD-10-CM

## 2021-09-05 DIAGNOSIS — B009 Herpesviral infection, unspecified: Secondary | ICD-10-CM

## 2021-09-05 DIAGNOSIS — M810 Age-related osteoporosis without current pathological fracture: Secondary | ICD-10-CM | POA: Diagnosis not present

## 2021-09-05 DIAGNOSIS — Z124 Encounter for screening for malignant neoplasm of cervix: Secondary | ICD-10-CM

## 2021-09-05 MED ORDER — ALENDRONATE SODIUM 70 MG PO TABS: ORAL_TABLET | ORAL | 4 refills | Status: DC

## 2021-09-05 MED ORDER — VALACYCLOVIR HCL 500 MG PO TABS: 500.0000 mg | ORAL_TABLET | Freq: Every day | ORAL | 3 refills | Status: DC

## 2021-09-05 NOTE — Patient Instructions (Signed)

## 2021-09-06 LAB — CYTOLOGY - PAP: Diagnosis: NEGATIVE

## 2021-09-07 DIAGNOSIS — Z20822 Contact with and (suspected) exposure to covid-19: Secondary | ICD-10-CM | POA: Diagnosis not present

## 2021-09-16 ENCOUNTER — Ambulatory Visit (INDEPENDENT_AMBULATORY_CARE_PROVIDER_SITE_OTHER): Payer: Medicare Other | Admitting: Physician Assistant

## 2021-09-16 ENCOUNTER — Other Ambulatory Visit: Payer: Self-pay

## 2021-09-16 ENCOUNTER — Encounter: Payer: Self-pay | Admitting: Physician Assistant

## 2021-09-16 VITALS — BP 128/72 | HR 65 | Ht 64.5 in | Wt 141.8 lb

## 2021-09-16 DIAGNOSIS — L819 Disorder of pigmentation, unspecified: Secondary | ICD-10-CM | POA: Diagnosis not present

## 2021-09-16 DIAGNOSIS — Z6823 Body mass index (BMI) 23.0-23.9, adult: Secondary | ICD-10-CM | POA: Diagnosis not present

## 2021-09-16 DIAGNOSIS — Z8679 Personal history of other diseases of the circulatory system: Secondary | ICD-10-CM | POA: Diagnosis not present

## 2021-09-16 DIAGNOSIS — I7300 Raynaud's syndrome without gangrene: Secondary | ICD-10-CM

## 2021-09-16 DIAGNOSIS — M810 Age-related osteoporosis without current pathological fracture: Secondary | ICD-10-CM | POA: Diagnosis not present

## 2021-09-16 DIAGNOSIS — Z8719 Personal history of other diseases of the digestive system: Secondary | ICD-10-CM | POA: Diagnosis not present

## 2021-09-16 DIAGNOSIS — M5136 Other intervertebral disc degeneration, lumbar region: Secondary | ICD-10-CM

## 2021-09-16 DIAGNOSIS — E559 Vitamin D deficiency, unspecified: Secondary | ICD-10-CM | POA: Diagnosis not present

## 2021-09-16 DIAGNOSIS — R252 Cramp and spasm: Secondary | ICD-10-CM

## 2021-09-16 DIAGNOSIS — Z79899 Other long term (current) drug therapy: Secondary | ICD-10-CM

## 2021-09-16 DIAGNOSIS — L930 Discoid lupus erythematosus: Secondary | ICD-10-CM | POA: Diagnosis not present

## 2021-09-16 DIAGNOSIS — I10 Essential (primary) hypertension: Secondary | ICD-10-CM | POA: Diagnosis not present

## 2021-09-16 DIAGNOSIS — M3219 Other organ or system involvement in systemic lupus erythematosus: Secondary | ICD-10-CM | POA: Diagnosis not present

## 2021-09-17 NOTE — Progress Notes (Signed)
dsDNA is negative

## 2021-09-17 NOTE — Progress Notes (Signed)
CBC and CMP WNL.  Complements and ESR WNL.  Vitamin D WNL.  Mg WNL.  Protein creatinine WNL.

## 2021-09-19 LAB — C3 AND C4
C3 Complement: 133 mg/dL (ref 83–193)
C4 Complement: 28 mg/dL (ref 15–57)

## 2021-09-19 LAB — CBC WITH DIFFERENTIAL/PLATELET
Absolute Monocytes: 514 cells/uL (ref 200–950)
Basophils Absolute: 53 cells/uL (ref 0–200)
Basophils Relative: 1.1 %
Eosinophils Absolute: 322 cells/uL (ref 15–500)
Eosinophils Relative: 6.7 %
HCT: 42.6 % (ref 35.0–45.0)
Hemoglobin: 14.1 g/dL (ref 11.7–15.5)
Lymphs Abs: 1483 cells/uL (ref 850–3900)
MCH: 28.3 pg (ref 27.0–33.0)
MCHC: 33.1 g/dL (ref 32.0–36.0)
MCV: 85.4 fL (ref 80.0–100.0)
MPV: 9.9 fL (ref 7.5–12.5)
Monocytes Relative: 10.7 %
Neutro Abs: 2429 cells/uL (ref 1500–7800)
Neutrophils Relative %: 50.6 %
Platelets: 295 10*3/uL (ref 140–400)
RBC: 4.99 10*6/uL (ref 3.80–5.10)
RDW: 12.4 % (ref 11.0–15.0)
Total Lymphocyte: 30.9 %
WBC: 4.8 10*3/uL (ref 3.8–10.8)

## 2021-09-19 LAB — COMPLETE METABOLIC PANEL WITH GFR
AG Ratio: 1.6 (calc) (ref 1.0–2.5)
ALT: 26 U/L (ref 6–29)
AST: 16 U/L (ref 10–35)
Albumin: 4.9 g/dL (ref 3.6–5.1)
Alkaline phosphatase (APISO): 57 U/L (ref 37–153)
BUN: 13 mg/dL (ref 7–25)
CO2: 27 mmol/L (ref 20–32)
Calcium: 10.3 mg/dL (ref 8.6–10.4)
Chloride: 98 mmol/L (ref 98–110)
Creat: 0.93 mg/dL (ref 0.60–1.00)
Globulin: 3 g/dL (calc) (ref 1.9–3.7)
Glucose, Bld: 92 mg/dL (ref 65–139)
Potassium: 3.6 mmol/L (ref 3.5–5.3)
Sodium: 136 mmol/L (ref 135–146)
Total Bilirubin: 0.9 mg/dL (ref 0.2–1.2)
Total Protein: 7.9 g/dL (ref 6.1–8.1)
eGFR: 66 mL/min/{1.73_m2} (ref >=60)

## 2021-09-19 LAB — SEDIMENTATION RATE: Sed Rate: 2 mm/h (ref 0–30)

## 2021-09-19 LAB — VITAMIN D 25 HYDROXY (VIT D DEFICIENCY, FRACTURES): Vit D, 25-Hydroxy: 45 ng/mL (ref 30–100)

## 2021-09-19 LAB — ANTI-NUCLEAR AB-TITER (ANA TITER): ANA Titer 1: 1:80 {titer} — ABNORMAL HIGH

## 2021-09-19 LAB — PROTEIN / CREATININE RATIO, URINE
Creatinine, Urine: 52 mg/dL (ref 20–275)
Protein/Creat Ratio: 96 mg/g creat (ref 24–184)
Protein/Creatinine Ratio: 0.096 mg/mg creat (ref 0.024–0.184)
Total Protein, Urine: 5 mg/dL (ref 5–24)

## 2021-09-19 LAB — MAGNESIUM: Magnesium: 2.2 mg/dL (ref 1.5–2.5)

## 2021-09-19 LAB — ANA: Anti Nuclear Antibody (ANA): POSITIVE — AB

## 2021-09-19 LAB — ANTI-DNA ANTIBODY, DOUBLE-STRANDED: ds DNA Ab: <1 IU/mL

## 2021-09-19 NOTE — Progress Notes (Signed)
ANA remains positive-low, nonspecific titer.

## 2021-09-24 ENCOUNTER — Ambulatory Visit (INDEPENDENT_AMBULATORY_CARE_PROVIDER_SITE_OTHER): Payer: Medicare Other | Admitting: Cardiology

## 2021-09-24 ENCOUNTER — Other Ambulatory Visit: Payer: Self-pay

## 2021-09-24 ENCOUNTER — Encounter: Payer: Self-pay | Admitting: Cardiology

## 2021-09-24 DIAGNOSIS — R051 Acute cough: Secondary | ICD-10-CM | POA: Diagnosis not present

## 2021-09-24 DIAGNOSIS — I2510 Atherosclerotic heart disease of native coronary artery without angina pectoris: Secondary | ICD-10-CM | POA: Diagnosis not present

## 2021-09-24 DIAGNOSIS — I2584 Coronary atherosclerosis due to calcified coronary lesion: Secondary | ICD-10-CM | POA: Diagnosis not present

## 2021-09-24 DIAGNOSIS — M3219 Other organ or system involvement in systemic lupus erythematosus: Secondary | ICD-10-CM | POA: Diagnosis not present

## 2021-09-24 DIAGNOSIS — I7300 Raynaud's syndrome without gangrene: Secondary | ICD-10-CM | POA: Diagnosis not present

## 2021-09-24 DIAGNOSIS — Z20822 Contact with and (suspected) exposure to covid-19: Secondary | ICD-10-CM | POA: Diagnosis not present

## 2021-09-24 DIAGNOSIS — R059 Cough, unspecified: Secondary | ICD-10-CM | POA: Diagnosis not present

## 2021-09-24 NOTE — Patient Instructions (Signed)
Medication Instructions:  ?The current medical regimen is effective;  continue present plan and medications. ? ?*If you need a refill on your cardiac medications before your next appointment, please call your pharmacy* ? ?Follow-Up: ?At [facility], you and your health needs are our priority.  As part of our continuing mission to provide you with exceptional heart care, we have created designated Provider Care Teams.  These Care Teams include your primary Cardiologist (physician) and Advanced Practice Providers (APPs -  Physician Assistants and Nurse Practitioners) who all work together to provide you with the care you need, when you need it. ? ?We recommend signing up for the patient portal called "MyChart".  Sign up information is provided on this After Visit Summary.  MyChart is used to connect with patients for Virtual Visits (Telemedicine).  Patients are able to view lab/test results, encounter notes, upcoming appointments, etc.  Non-urgent messages can be sent to your provider as well.   ?To learn more about what you can do with MyChart, go to [email or web address].   ? ?Your next appointment:   ?1 year(s) ? ?The format for your next appointment:   ?In Person ? ?Provider:   ?Dr Candee Furbish ? ?If primary card or EP is not listed click here to update    :1}  ? ?Thank you for choosing [facility]!! ? ? ? ?Heart-Healthy Eating Plan ?Heart-healthy meal planning includes: ?Eating less unhealthy fats. ?Eating more healthy fats. ?Making other changes in your diet. ? ?What are tips for following this plan? ?Cooking ?Avoid frying your food. Try to bake, boil, grill, or broil it instead. You can also reduce fat by: ?Removing the skin from poultry. ?Removing all visible fats from meats. ?Steaming vegetables in water or broth. ?Meal planning ? ?At meals, divide your plate into four equal parts: ?Fill one-half of your plate with vegetables and green salads. ?Fill one-fourth of your plate with whole  grains. ?Fill one-fourth of your plate with lean protein foods. ?Eat 4-5 servings of vegetables per day. A serving of vegetables is: ?1 cup of raw or cooked vegetables. ?2 cups of raw leafy greens. ?Eat 4-5 servings of fruit per day. A serving of fruit is: ?1 medium whole fruit. ?? cup of dried fruit. ?? cup of fresh, frozen, or canned fruit. ?? cup of 100% fruit juice. ?Eat more foods that have soluble fiber. These are apples, broccoli, carrots, beans, peas, and barley. Try to get 20-30 g of fiber per day. ?Eat 4-5 servings of nuts, legumes, and seeds per week: ?1 serving of dried beans or legumes equals ? cup after being cooked. ?1 serving of nuts is ? cup. ?1 serving of seeds equals 1 tablespoon. ?General information ?Eat more home-cooked food. Eat less restaurant, buffet, and fast food. ?Limit or avoid alcohol. ?Limit foods that are high in starch and sugar. ?Avoid fried foods. ?Lose weight if you are overweight. ?Keep track of how much salt (sodium) you eat. This is important if you have high blood pressure. Ask your doctor to tell you more about this. ?Try to add vegetarian meals each week. ?Fats ?Choose healthy fats. These include olive oil and canola oil, flaxseeds, walnuts, almonds, and seeds. ?Eat more omega-3 fats. These include salmon, mackerel, sardines, tuna, flaxseed oil, and ground flaxseeds. Try to eat fish at least 2 times each week. ?Check food labels. Avoid foods with trans fats or high amounts of saturated fat. ?Limit saturated fats. ?These are often found in animal products, such as meats, butter,  and cream. ?These are also found in plant foods, such as palm oil, palm kernel oil, and coconut oil. ?Avoid foods with partially hydrogenated oils in them. These have trans fats. Examples are stick margarine, some tub margarines, cookies, crackers, and other baked goods. ?What foods can I eat? ?Fruits ?All fresh, canned (in natural juice), or frozen fruits. ?Vegetables ?Fresh or frozen vegetables  (raw, steamed, roasted, or grilled). Green salads. ?Grains ?Most grains. Choose whole wheat and whole grains most of the time. Rice and pasta, including brown rice and pastas made with whole wheat. ?Meats and other proteins ?Lean, well-trimmed beef, veal, pork, and lamb. Chicken and [place] without skin. All fish and shellfish. Wild duck, rabbit, pheasant, and venison. Egg whites or low-cholesterol egg substitutes. Dried beans, peas, lentils, and tofu. Seeds and most nuts. ?Dairy ?Low-fat or nonfat cheeses, including ricotta and mozzarella. Skim or 1% milk that is liquid, powdered, or evaporated. Buttermilk that is made with low-fat milk. Nonfat or low-fat yogurt. ?Fats and oils ?Non-hydrogenated (trans-free) margarines. Vegetable oils, including soybean, sesame, sunflower, olive, peanut, safflower, corn, canola, and cottonseed. Salad dressings or mayonnaise made with a vegetable oil. ?Beverages ?Mineral water. Coffee and tea. Diet carbonated beverages. ?Sweets and desserts ?Sherbet, gelatin, and fruit ice. Small amounts of dark chocolate. ?Limit all sweets and desserts. ?Seasonings and condiments ?All seasonings and condiments. ?The items listed above may not be a complete list of foods and drinks you can eat. Contact a dietitian for more options. ?What foods should I avoid? ?Fruits ?Canned fruit in heavy syrup. Fruit in cream or butter sauce. Fried fruit. Limit coconut. ?Vegetables ?Vegetables cooked in cheese, cream, or butter sauce. Fried vegetables. ?Grains ?Breads that are made with saturated or trans fats, oils, or whole milk. Croissants. Sweet rolls. Donuts. High-fat crackers, such as cheese crackers. ?Meats and other proteins ?Fatty meats, such as hot dogs, ribs, sausage, bacon, rib-eye roast or steak. High-fat deli meats, such as salami and bologna. Caviar. Domestic duck and goose. Organ meats, such as liver. ?Dairy ?Cream, sour cream, cream cheese, and creamed cottage cheese. Whole-milk cheeses. Whole or  2% milk that is liquid, evaporated, or condensed. Whole buttermilk. Cream sauce or high-fat cheese sauce. Yogurt that is made from whole milk. ?Fats and oils ?Meat fat, or shortening. Cocoa butter, hydrogenated oils, palm oil, coconut oil, palm kernel oil. Solid fats and shortenings, including bacon fat, salt pork, lard, and butter. Nondairy cream substitutes. Salad dressings with cheese or sour cream. ?Beverages ?Regular sodas and juice drinks with added sugar. ?Sweets and desserts ?Frosting. Pudding. Cookies. Cakes. Pies. Milk chocolate or white chocolate. Buttered syrups. Full-fat ice cream or ice cream drinks. ?The items listed above may not be a complete list of foods and drinks to avoid. Contact a dietitian for more information. ?Summary ?Heart-healthy meal planning includes eating less unhealthy fats, eating more healthy fats, and making other changes in your diet. ?Eat a balanced diet. This includes fruits and vegetables, low-fat or nonfat dairy, lean protein, nuts and legumes, whole grains, and heart-healthy oils and fats. ?This information is not intended to replace advice given to you by your health care provider. Make sure you discuss any questions you have with your health care provider. ?Document Revised: 11/15/2020 Document Reviewed: 11/15/2020 ?Elsevier Patient Education ? 2022 [place]. ? ? ?

## 2021-09-24 NOTE — Progress Notes (Signed)
Cardiology Office Note:    Date:  09/24/2021   ID:  Theresa Graham, DOB 1950-08-25, MRN [id number]  PCP:  Carol Ada, MD   [facility] Providers Cardiologist:  Candee Furbish, MD     Referring MD: Carol Ada, MD    History of Present Illness:    Theresa Graham is a 71 y.o. female here for follow-up coronary artery disease family history, mild lupus with a history of Raynaud's prediabetes.  Lupus was diagnosed in her 60s.  Has hypertension as well.  Coronary artery calcification was noted in July 2022.  Crestor 10 mg was started.  She was encouraged to start statin therapy after talking with our nursing team.  We discussed diet and exercise.  She states that she has trouble with sweets.   Her father died at age 71 from a myocardial infarction.  1 sister had a TIA in her 23s.  Another sister had a myocardial infarction at age 78 and AFIB, CVA. Mother died 2 MI. Brother had MI at 83.   She works at [employer].  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   Past Medical History:  Diagnosis Date   HSV (herpes simplex virus) infection    Hydrosalpinx    Hypertension    IBS (irritable bowel syndrome)    Insomnia    Lupus (HCC)    sle and discoid lupus   Lupus ([place])    Osteoporosis 2019   T score -2.8 statistically improved from prior study   Prediabetes    Raynaud's disease    Spinal stenosis    per patient    Systemic lupus erythematosus ([place])     Past Surgical History:  Procedure Laterality Date   ABDOMINAL HYSTERECTOMY  1987   TAH   back injection  05/2020   FOOT SURGERY Left 2010   hysterectomy  1987   ROTATOR CUFF REPAIR  2006   SHOULDER SURGERY FOR BONE SPUR  2010   STRESS FRACTURE LEG  08/2007   TENDON RELEASE Left 01/07/2018   TUBAL LIGATION  1978   WRIST  SURGERY Right 09/23/2018   tendon release    WRIST SURGERY Left 12/2017    Current Medications: Current Meds  Medication Sig   alendronate (FOSAMAX) 70 MG tablet TAKE 1 TABLET BY MOUTH ONCE A WEEK. TAKE WITH A FULL  GLASS OF WATER ON AN EMPTY  STOMACH.   amLODipine (NORVASC) 5 MG tablet Take 5 mg by mouth daily.     Ascorbic Acid (VITAMIN C CR) 1000 MG TBCR SMARTSIG:1 By Mouth   bimatoprost (LATISSE) 0.03 % ophthalmic solution Apply 1 drop to eye daily.   CALCIUM 600 1500 (600 Ca) MG TABS tablet SMARTSIG:1 By Mouth   cetirizine (ZYRTEC) 10 MG tablet Take 1 tablet by mouth as needed.   Cholecalciferol (VITAMIN D PO) Take 5,000 Units by mouth.   diclofenac sodium (VOLTAREN) 1 % GEL Apply 3 g topically 3 (three) times daily as needed.   hydroxychloroquine (PLAQUENIL) 200 MG tablet TAKE 1 TABLET BY MOUTH  EVERY OTHER DAY   linaclotide (LINZESS) 290 MCG CAPS capsule Take by mouth daily before breakfast.    magnesium oxide (MAG-OX) 400 MG tablet Take 400 mg by mouth daily.   montelukast (SINGULAIR) 10 MG tablet Take 10 mg by mouth as needed.  Multiple Vitamin (MULTIVITAMIN) capsule Take 1 capsule by mouth daily.     Probiotic Product (PROBIOTIC-10 PO) Take 1 tablet by mouth daily.   rosuvastatin (CRESTOR) 10 MG tablet 1 tablet   triamcinolone cream (KENALOG) 0.1 % APP AA ON THE SKIN D PRN   triamterene-hydrochlorothiazide (MAXZIDE-25) 37.5-25 MG tablet Take 1 tablet by mouth daily.   valACYclovir (VALTREX) 500 MG tablet Take 1 tablet (500 mg total) by mouth daily. Take daily for prevention.  Take one tablet by mouth twice a day for 3 day for an outbreak.     Allergies:   Patient has no known allergies.   Social History   Socioeconomic History   Marital status: Widowed    Spouse name: Not on file   Number of children: Not on file   Years of education: Not on file   Highest education level: Not on file  Occupational History   Not on file  Tobacco Use   Smoking status: Never     Passive exposure: Never   Smokeless tobacco: Never  Vaping Use   Vaping Use: Never used  Substance and Sexual Activity   Alcohol use: Not Currently    Comment: occ   Drug use: No   Sexual activity: Not Currently    Birth control/protection: Surgical    Comment: 1st intercourse 71 yo-Fewer than 5 partners  Other Topics Concern   Not on file  Social History Narrative   Not on file   Social Determinants of Health   Financial Resource Strain: Not on file  Food Insecurity: Not on file  Transportation Needs: Not on file  Physical Activity: Not on file  Stress: Not on file  Social Connections: Not on file     Family History: The patient's family history includes Breast cancer in her mother; Cancer in her father, mother, sister, and sister; Colitis in her sister; Crohn's disease in her sister and sister; Diabetes in her father, maternal grandmother, mother, paternal grandfather, and paternal grandmother; Healthy in her son; Heart disease in her father and mother; Hypertension in her brother, father, maternal grandmother, mother, paternal grandmother, and sister; Rheum arthritis in her son; Sarcoidosis in her sister; Stroke in her sister; Thyroid disease in her mother.  ROS:   Please see the history of present illness.     All other systems reviewed and are negative.  EKGs/Labs/Other Studies Reviewed:    The following studies were reviewed today: 02/07/2021 Coronary calcium score of 92. This was 71st percentile for age-, race-, and sex-matched controls. Aortic atherosclerosis noted.   Recent Labs: 09/16/2021: ALT 26; BUN 13; Creat 0.93; Hemoglobin 14.1; Magnesium 2.2; Platelets 295; Potassium 3.6; Sodium 136  Recent Lipid Panel    Component Value Date/Time   CHOL 184 07/31/2021 1334   TRIG 41 07/31/2021 1334   HDL 118 07/31/2021 1334   CHOLHDL 1.6 07/31/2021 1334   LDLCALC 57 07/31/2021 1334     Risk Assessment/Calculations:              Physical Exam:    VS:  BP  126/74    Pulse 61    Ht 5' 4.5" (1.638 m)    Wt 149 lb (67.6 kg)    SpO2 96%    BMI 25.18 kg/m     Wt Readings from Last 3 Encounters:  09/24/21 149 lb (67.6 kg)  09/16/21 141 lb 12.8 oz (64.3 kg)  09/05/21 147 lb (66.7 kg)     GEN:  Well nourished, well developed in no  acute distress HEENT: Normal NECK: No JVD; No carotid bruits LYMPHATICS: No lymphadenopathy CARDIAC: RRR, no murmurs, no rubs, gallops RESPIRATORY:  Clear to auscultation without rales, wheezing or rhonchi  ABDOMEN: Soft, non-tender, non-distended MUSCULOSKELETAL:  No edema; No deformity  SKIN: Warm and dry NEUROLOGIC:  Alert and oriented x 3 PSYCHIATRIC:  Normal affect   ASSESSMENT:    1. Coronary artery calcification   2. Raynaud's disease without gangrene   3. Other systemic lupus erythematosus with other organ involvement ([place])    PLAN:    In order of problems listed above:  Coronary artery calcification Coronary calcium score 9271 percentile.  Crestor was requested.  She had some hesitancies about this originally.  We discussed these issues such as slight increase in blood glucose.  Explained the rationale of plaque stabilization.  LDL goal should be less than 70.  At last check 57, excellent.  She is doing an excellent job at [employer].  Ultimately want to prevent heart attacks and strokes.  Raynaud's disease without gangrene Has been taking amlodipine.  Stable.  Systemic lupus erythematosus ([place]) Continues to see Dr. Estanislado Pandy with rheumatology.  Notes reviewed.         Medication Adjustments/Labs and Tests Ordered: Current medicines are reviewed at length with the patient today.  Concerns regarding medicines are outlined above.  No orders of the defined types were placed in this encounter.  No orders of the defined types were placed in this encounter.   Patient Instructions  Medication Instructions:  The current medical regimen is effective;  continue present plan and  medications.  *If you need a refill on your cardiac medications before your next appointment, please call your pharmacy*  Follow-Up: At [facility], you and your health needs are our priority.  As part of our continuing mission to provide you with exceptional heart care, we have created designated Provider Care Teams.  These Care Teams include your primary Cardiologist (physician) and Advanced Practice Providers (APPs -  Physician Assistants and Nurse Practitioners) who all work together to provide you with the care you need, when you need it.  We recommend signing up for the patient portal called "MyChart".  Sign up information is provided on this After Visit Summary.  MyChart is used to connect with patients for Virtual Visits (Telemedicine).  Patients are able to view lab/test results, encounter notes, upcoming appointments, etc.  Non-urgent messages can be sent to your provider as well.   To learn more about what you can do with MyChart, go to [email or web address].    Your next appointment:   1 year(s)  The format for your next appointment:   In Person  Provider:   Dr Candee Furbish  If primary card or EP is not listed click here to update    :1}   Thank you for choosing [facility]!!    Heart-Healthy Eating Plan Heart-healthy meal planning includes: Eating less unhealthy fats. Eating more healthy fats. Making other changes in your diet.  What are tips for following this plan? Cooking Avoid frying your food. Try to bake, boil, grill, or broil it instead. You can also reduce fat by: Removing the skin from poultry. Removing all visible fats from meats. Steaming vegetables in water or broth. Meal planning  At meals, divide your plate into four equal parts: Fill one-half of your plate with vegetables and green salads. Fill one-fourth of your plate with whole grains. Fill one-fourth of your plate with lean protein foods. Eat 4-5 servings  of vegetables per day. A  serving of vegetables is: 1 cup of raw or cooked vegetables. 2 cups of raw leafy greens. Eat 4-5 servings of fruit per day. A serving of fruit is: 1 medium whole fruit.  cup of dried fruit.  cup of fresh, frozen, or canned fruit.  cup of 100% fruit juice. Eat more foods that have soluble fiber. These are apples, broccoli, carrots, beans, peas, and barley. Try to get 20-30 g of fiber per day. Eat 4-5 servings of nuts, legumes, and seeds per week: 1 serving of dried beans or legumes equals  cup after being cooked. 1 serving of nuts is  cup. 1 serving of seeds equals 1 tablespoon. General information Eat more home-cooked food. Eat less restaurant, buffet, and fast food. Limit or avoid alcohol. Limit foods that are high in starch and sugar. Avoid fried foods. Lose weight if you are overweight. Keep track of how much salt (sodium) you eat. This is important if you have high blood pressure. Ask your doctor to tell you more about this. Try to add vegetarian meals each week. Fats Choose healthy fats. These include olive oil and canola oil, flaxseeds, walnuts, almonds, and seeds. Eat more omega-3 fats. These include salmon, mackerel, sardines, tuna, flaxseed oil, and ground flaxseeds. Try to eat fish at least 2 times each week. Check food labels. Avoid foods with trans fats or high amounts of saturated fat. Limit saturated fats. These are often found in animal products, such as meats, butter, and cream. These are also found in plant foods, such as palm oil, palm kernel oil, and coconut oil. Avoid foods with partially hydrogenated oils in them. These have trans fats. Examples are stick margarine, some tub margarines, cookies, crackers, and other baked goods. What foods can I eat? Fruits All fresh, canned (in natural juice), or frozen fruits. Vegetables Fresh or frozen vegetables (raw, steamed, roasted, or grilled). Green salads. Grains Most grains. Choose whole wheat and whole grains  most of the time. Rice and pasta, including brown rice and pastas made with whole wheat. Meats and other proteins Lean, well-trimmed beef, veal, pork, and lamb. Chicken and [place] without skin. All fish and shellfish. Wild duck, rabbit, pheasant, and venison. Egg whites or low-cholesterol egg substitutes. Dried beans, peas, lentils, and tofu. Seeds and most nuts. Dairy Low-fat or nonfat cheeses, including ricotta and mozzarella. Skim or 1% milk that is liquid, powdered, or evaporated. Buttermilk that is made with low-fat milk. Nonfat or low-fat yogurt. Fats and oils Non-hydrogenated (trans-free) margarines. Vegetable oils, including soybean, sesame, sunflower, olive, peanut, safflower, corn, canola, and cottonseed. Salad dressings or mayonnaise made with a vegetable oil. Beverages Mineral water. Coffee and tea. Diet carbonated beverages. Sweets and desserts Sherbet, gelatin, and fruit ice. Small amounts of dark chocolate. Limit all sweets and desserts. Seasonings and condiments All seasonings and condiments. The items listed above may not be a complete list of foods and drinks you can eat. Contact a dietitian for more options. What foods should I avoid? Fruits Canned fruit in heavy syrup. Fruit in cream or butter sauce. Fried fruit. Limit coconut. Vegetables Vegetables cooked in cheese, cream, or butter sauce. Fried vegetables. Grains Breads that are made with saturated or trans fats, oils, or whole milk. Croissants. Sweet rolls. Donuts. High-fat crackers, such as cheese crackers. Meats and other proteins Fatty meats, such as hot dogs, ribs, sausage, bacon, rib-eye roast or steak. High-fat deli meats, such as salami and bologna. Caviar. Domestic duck and goose. Organ meats,  such as liver. Dairy Cream, sour cream, cream cheese, and creamed cottage cheese. Whole-milk cheeses. Whole or 2% milk that is liquid, evaporated, or condensed. Whole buttermilk. Cream sauce or high-fat cheese sauce.  Yogurt that is made from whole milk. Fats and oils Meat fat, or shortening. Cocoa butter, hydrogenated oils, palm oil, coconut oil, palm kernel oil. Solid fats and shortenings, including bacon fat, salt pork, lard, and butter. Nondairy cream substitutes. Salad dressings with cheese or sour cream. Beverages Regular sodas and juice drinks with added sugar. Sweets and desserts Frosting. Pudding. Cookies. Cakes. Pies. Milk chocolate or white chocolate. Buttered syrups. Full-fat ice cream or ice cream drinks. The items listed above may not be a complete list of foods and drinks to avoid. Contact a dietitian for more information. Summary Heart-healthy meal planning includes eating less unhealthy fats, eating more healthy fats, and making other changes in your diet. Eat a balanced diet. This includes fruits and vegetables, low-fat or nonfat dairy, lean protein, nuts and legumes, whole grains, and heart-healthy oils and fats. This information is not intended to replace advice given to you by your health care provider. Make sure you discuss any questions you have with your health care provider. Document Revised: 11/15/2020 Document Reviewed: 11/15/2020 Elsevier Patient Education  [address].     Signed, Candee Furbish, MD  09/24/2021 10:28 AM    [place]

## 2021-09-24 NOTE — Assessment & Plan Note (Addendum)
Coronary calcium score 9271 percentile.  Crestor was requested.  She had some hesitancies about this originally.  We discussed these issues such as slight increase in blood glucose.  Explained the rationale of plaque stabilization.  LDL goal should be less than 70.  At last check 57, excellent.  She is doing an excellent job at [employer].  Ultimately want to prevent heart attacks and strokes. ?

## 2021-09-24 NOTE — Assessment & Plan Note (Signed)
Has been taking amlodipine.  Stable. ?

## 2021-09-24 NOTE — Assessment & Plan Note (Addendum)
Continues to see Dr. Estanislado Pandy with rheumatology.  Notes reviewed. ?

## 2021-10-01 DIAGNOSIS — Z20822 Contact with and (suspected) exposure to covid-19: Secondary | ICD-10-CM | POA: Diagnosis not present

## 2021-10-21 DIAGNOSIS — E7800 Pure hypercholesterolemia, unspecified: Secondary | ICD-10-CM | POA: Diagnosis not present

## 2021-10-21 DIAGNOSIS — I2583 Coronary atherosclerosis due to lipid rich plaque: Secondary | ICD-10-CM | POA: Diagnosis not present

## 2021-10-21 DIAGNOSIS — G4700 Insomnia, unspecified: Secondary | ICD-10-CM | POA: Diagnosis not present

## 2021-10-21 DIAGNOSIS — M329 Systemic lupus erythematosus, unspecified: Secondary | ICD-10-CM | POA: Diagnosis not present

## 2021-10-21 DIAGNOSIS — Z0000 Encounter for general adult medical examination without abnormal findings: Secondary | ICD-10-CM | POA: Diagnosis not present

## 2021-10-21 DIAGNOSIS — Z1389 Encounter for screening for other disorder: Secondary | ICD-10-CM | POA: Diagnosis not present

## 2021-10-21 DIAGNOSIS — R7303 Prediabetes: Secondary | ICD-10-CM | POA: Diagnosis not present

## 2021-10-21 DIAGNOSIS — I2510 Atherosclerotic heart disease of native coronary artery without angina pectoris: Secondary | ICD-10-CM | POA: Diagnosis not present

## 2021-10-21 DIAGNOSIS — M818 Other osteoporosis without current pathological fracture: Secondary | ICD-10-CM | POA: Diagnosis not present

## 2021-10-21 DIAGNOSIS — I10 Essential (primary) hypertension: Secondary | ICD-10-CM | POA: Diagnosis not present

## 2021-10-23 ENCOUNTER — Ambulatory Visit: Payer: Medicare Other | Admitting: Cardiology

## 2021-10-24 ENCOUNTER — Ambulatory Visit: Payer: Medicare Other | Admitting: Cardiology

## 2021-10-24 DIAGNOSIS — Z20828 Contact with and (suspected) exposure to other viral communicable diseases: Secondary | ICD-10-CM | POA: Diagnosis not present

## 2021-10-29 DIAGNOSIS — Z20828 Contact with and (suspected) exposure to other viral communicable diseases: Secondary | ICD-10-CM | POA: Diagnosis not present

## 2021-11-01 DIAGNOSIS — Z20822 Contact with and (suspected) exposure to covid-19: Secondary | ICD-10-CM | POA: Diagnosis not present

## 2021-11-04 ENCOUNTER — Encounter: Payer: Self-pay | Admitting: Obstetrics and Gynecology

## 2021-11-04 DIAGNOSIS — Z20822 Contact with and (suspected) exposure to covid-19: Secondary | ICD-10-CM | POA: Diagnosis not present

## 2021-11-05 NOTE — Telephone Encounter (Signed)
Please schedule an office visit with me. ?

## 2021-11-07 NOTE — Progress Notes (Signed)
GYNECOLOGY  VISIT ?  ?HPI: ?71 y.o.   Widowed  [place] American  female   ?G1P1001 with No LMP recorded. Patient has had a hysterectomy.   ?here for  Vulvar blistering lasting over a week or so.  ?She noticed some blood on her underwear and the lesions were seeping.  ?Starting to heal over. ?States the lesion is not her typical outbreak of HSV. ? ?Just got the Valtrex filled this week.  ?She has taken one twice a day for 3 days and plans to take it daily now. ? ?Has been experiencing some stress at home with home maintenance.  ? ?Cr 0.93 on 09/16/21. ? ?GYNECOLOGIC HISTORY: ?No LMP recorded. Patient has had a hysterectomy. ?Contraception:  PMP ?Menopausal hormone therapy:  None ?Last mammogram:  07-01-21 Normal Bi RADS 1 Cat.B ?Last pap smear:   09-05-21 Normal ?       ?OB History   ? ? Gravida  ?1  ? Para  ?1  ? Term  ?1  ? Preterm  ?   ? AB  ?   ? Living  ?1  ?  ? ? SAB  ?   ? IAB  ?   ? Ectopic  ?   ? Multiple  ?   ? Live Births  ?   ?   ?  ?  ?    ? ?Patient Active Problem List  ? Diagnosis Date Noted  ? Coronary artery calcification 09/24/2021  ? Trochanteric bursitis of left hip 01/08/2017  ? 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 Age-related osteoporosis without current pathological fracture 08/04/2016  ? Systemic lupus erythematosus ([place]) 08/01/2016  ? Vitamin D deficiency 08/01/2016  ? History of IBS 08/01/2016  ? Plantar fasciitis 08/01/2016  ? Raynaud's disease without gangrene 08/01/2016  ? History of hypertension 08/01/2016  ? High risk medication use 06/18/2016  ? Hypertension   ? HSV (herpes simplex virus) infection   ? Headache(784.0)   ? ? ?Past Medical History:  ?Diagnosis Date  ? HSV (herpes simplex virus) infection   ? Hydrosalpinx   ? Hypertension   ? IBS (irritable bowel syndrome)   ? Insomnia   ? Lupus ([place])   ? sle and discoid lupus  ? Lupus ([place])   ? Osteoporosis 2019  ? T score -2.8 statistically improved from prior study  ? Prediabetes   ? Raynaud's disease   ? Spinal stenosis   ? per patient   ?  Systemic lupus erythematosus ([place])   ? ? ?Past Surgical History:  ?Procedure Laterality Date  ? ABDOMINAL HYSTERECTOMY  1987  ? TAH  ? back injection  05/2020  ? FOOT SURGERY Left 2010  ? hysterectomy  1987  ? ROTATOR CUFF REPAIR  2006  ? SHOULDER SURGERY FOR BONE SPUR  2010  ? STRESS FRACTURE LEG  08/2007  ? TENDON RELEASE Left 01/07/2018  ? TUBAL LIGATION  1978  ? WRIST SURGERY Right 09/23/2018  ? tendon release   ? WRIST SURGERY Left 12/2017  ? ? ?Current Outpatient Medications  ?Medication Sig Dispense Refill  ? alendronate (FOSAMAX) 70 MG tablet TAKE 1 TABLET BY MOUTH ONCE A WEEK. TAKE WITH A FULL  GLASS OF WATER ON AN EMPTY  STOMACH. 12 tablet 4  ? amLODipine (NORVASC) 5 MG tablet Take 5 mg by mouth daily.      ? Ascorbic Acid (VITAMIN C CR) 1000 MG TBCR SMARTSIG:1 By Mouth    ? bimatoprost (LATISSE) 0.03 % ophthalmic solution Apply 1 drop  to eye daily.    ? CALCIUM 600 1500 (600 Ca) MG TABS tablet SMARTSIG:1 By Mouth    ? cetirizine (ZYRTEC) 10 MG tablet Take 1 tablet by mouth as needed.    ? Cholecalciferol (VITAMIN D PO) Take 5,000 Units by mouth.    ? diclofenac sodium (VOLTAREN) 1 % GEL Apply 3 g topically 3 (three) times daily as needed.    ? hydroxychloroquine (PLAQUENIL) 200 MG tablet TAKE 1 TABLET BY MOUTH  EVERY OTHER DAY 45 tablet 0  ? linaclotide (LINZESS) 290 MCG CAPS capsule Take by mouth daily before breakfast.     ? magnesium oxide (MAG-OX) 400 MG tablet Take 400 mg by mouth daily.    ? montelukast (SINGULAIR) 10 MG tablet Take 10 mg by mouth as needed.    ? Multiple Vitamin (MULTIVITAMIN) capsule Take 1 capsule by mouth daily.      ? Probiotic Product (PROBIOTIC-10 PO) Take 1 tablet by mouth daily.    ? rosuvastatin (CRESTOR) 10 MG tablet 1 tablet    ? triamcinolone cream (KENALOG) 0.1 % APP AA ON THE SKIN D PRN  0  ? triamterene-hydrochlorothiazide (MAXZIDE-25) 37.5-25 MG tablet Take 1 tablet by mouth daily.    ? valACYclovir (VALTREX) 500 MG tablet Take 1 tablet (500 mg total) by mouth  daily. Take daily for prevention.  Take one tablet by mouth twice a day for 3 day for an outbreak. 90 tablet 3  ? ?No current facility-administered medications for this visit.  ?  ? ?ALLERGIES: Patient has no known allergies. ? ?Family History  ?Problem Relation Age of Onset  ? Cancer Mother   ? Diabetes Mother   ? Hypertension Mother   ? Thyroid disease Mother   ? Heart disease Mother   ? Breast cancer Mother   ?     Age 32  ? Diabetes Father   ? Hypertension Father   ? Cancer Father   ?     COLON  ? Heart disease Father   ? Crohn's disease Sister   ? Hypertension Sister   ? Stroke Sister   ? Cancer Sister   ?     Intestinal Ca  ? Colitis Sister   ? Sarcoidosis Sister   ? Cancer Sister   ? Crohn's disease Sister   ? Hypertension Brother   ? Hypertension Maternal Grandmother   ? Diabetes Maternal Grandmother   ? Hypertension Paternal Grandmother   ? Diabetes Paternal Grandmother   ? Diabetes Paternal Grandfather   ? Healthy Son   ? Rheum arthritis Son   ? ? ?Social History  ? ?Socioeconomic History  ? Marital status: Widowed  ?  Spouse name: Not on file  ? Number of children: Not on file  ? Years of education: Not on file  ? Highest education level: Not on file  ?Occupational History  ? Not on file  ?Tobacco Use  ? Smoking status: Never  ?  Passive exposure: Never  ? Smokeless tobacco: Never  ?Vaping Use  ? Vaping Use: Never used  ?Substance and Sexual Activity  ? Alcohol use: Not Currently  ?  Comment: occ  ? Drug use: No  ? Sexual activity: Not Currently  ?  Birth control/protection: Surgical  ?  Comment: 1st intercourse 71 yo-Fewer than 5 partners  ?Other Topics Concern  ? Not on file  ?Social History Narrative  ? Not on file  ? ?Social Determinants of Health  ? ?Financial Resource Strain: Not on file  ?  Food Insecurity: Not on file  ?Transportation Needs: Not on file  ?Physical Activity: Not on file  ?Stress: Not on file  ?Social Connections: Not on file  ?Intimate Partner Violence: Not on file  ? ? ?Review of  Systems  See HPI.  ? ?PHYSICAL EXAMINATION:   ? ?BP 118/78     ?General appearance: alert, cooperative and appears stated age ?  ?Pelvic: External genitalia:  1 cm crusted lesion of the right labia majora, nontender.  ?             Urethra:  normal appearing urethra with no masses, tenderness or lesions ?             Bartholins and Skenes: normal    ?             Vagina:  atrophy noted.   ?             Cervix: absent ?               ?Bimanual Exam:  Uterus:  absent ?             Adnexa: no mass, fullness, tenderness ?      ? ?Chaperone was present for exam:  Onalee Hua, CMA ? ?ASSESSMENT ? ?Vulvar ulcerative lesion.  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  ? ?PLAN ? ?Nuswab sent for HSV I and II PCR testing.  ?Valtrex 500 mg daily and then bid x 3 days as needed for an outbreak.  Potential side effects of medication discussed.  ?She declines lidocaine ointment.  ?FU prn.  ?  ?An After Visit Summary was printed and given to the patient. ? ?21 min  total time was spent for this patient encounter, including preparation, face-to-face counseling with the patient, coordination of care, and documentation of the encounter. ? ?  ?

## 2021-11-08 ENCOUNTER — Encounter: Payer: Self-pay | Admitting: Obstetrics and Gynecology

## 2021-11-08 ENCOUNTER — Ambulatory Visit (INDEPENDENT_AMBULATORY_CARE_PROVIDER_SITE_OTHER): Payer: Medicare Other | Admitting: Obstetrics and Gynecology

## 2021-11-08 VITALS — BP 118/78

## 2021-11-08 DIAGNOSIS — I2510 Atherosclerotic heart disease of native coronary artery without angina pectoris: Secondary | ICD-10-CM

## 2021-11-08 DIAGNOSIS — N766 Ulceration of vulva: Secondary | ICD-10-CM

## 2021-11-08 DIAGNOSIS — I2584 Coronary atherosclerosis due to calcified coronary lesion: Secondary | ICD-10-CM | POA: Diagnosis not present

## 2021-11-13 LAB — SURESWAB HSV, TYPE 1/2 DNA, PCR
HSV 1 DNA: NOT DETECTED
HSV 2 DNA: DETECTED — AB

## 2021-11-13 IMAGING — CT CT CARDIAC CORONARY ARTERY CALCIUM SCORE
3 series · 14 of 20 positions shown, 15 images · non-contrast
Comparison: None.
COMPARISON: None.

Addendum:
EXAM:
OVER-READ INTERPRETATION  CT CHEST

The following report is an over-read performed by radiologist Dr.
Hp Boyko [REDACTED] on 02/07/2021. This
over-read does not include interpretation of cardiac or coronary
anatomy or pathology. The coronary calcium score interpretation by
the cardiologist is attached.
CLINICAL DATA: Cardiovascular Disease Risk stratification
Coronary Calcium Score
TECHNIQUE: A gated, non-contrast computed tomography scan of the heart was
performed using 3mm slice thickness. Axial images were analyzed on a
dedicated workstation. Calcium scoring of the coronary arteries was
performed using the Agatston method.

[Series 2: casc 3.0 bv41 2 bestdiast 71 % · axial · 0.40mm/px · z∈[-190,-128]mm · 4 of 37 slices shown, 5 images]
[im 8/37  vessel]
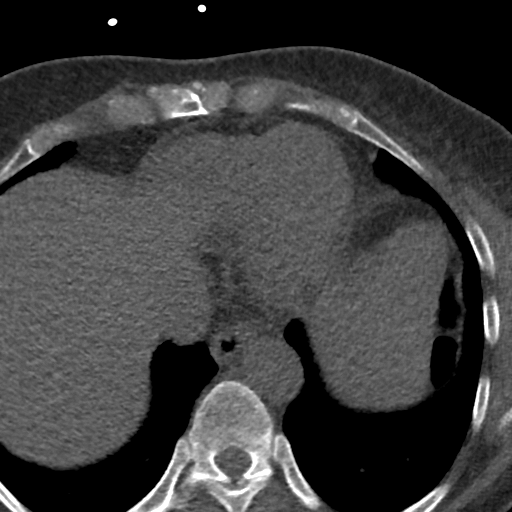
[im 8/37  lung]
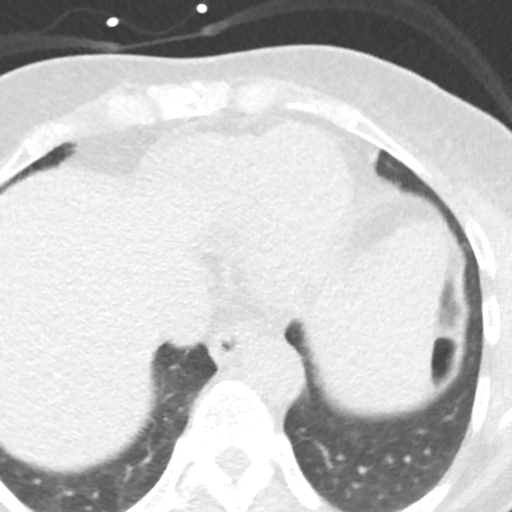
[im 15/37  vessel]
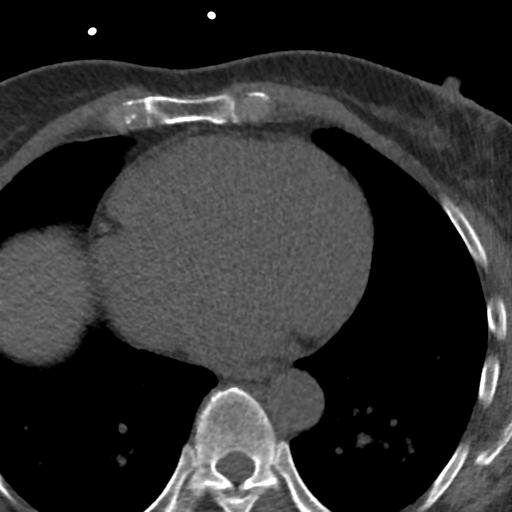
[im 22/37  vessel]
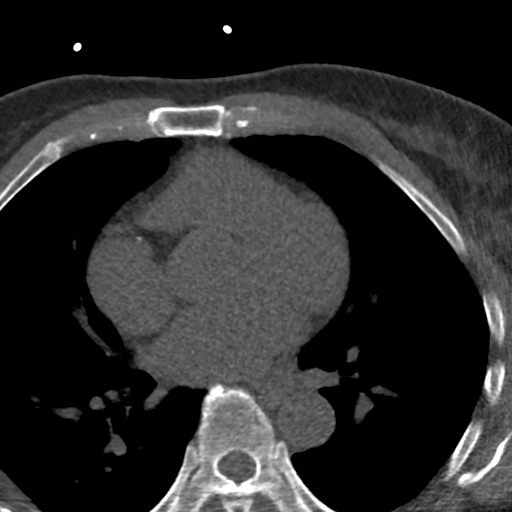
[im 29/37  vessel]
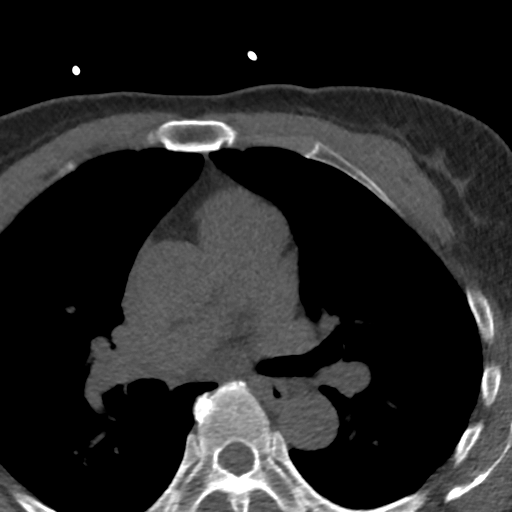

[Series 3: lung 71 % · axial · 0.63mm/px · z∈[-194,-122]mm · 5 of 37 slices shown]
[im 7/37  lung]
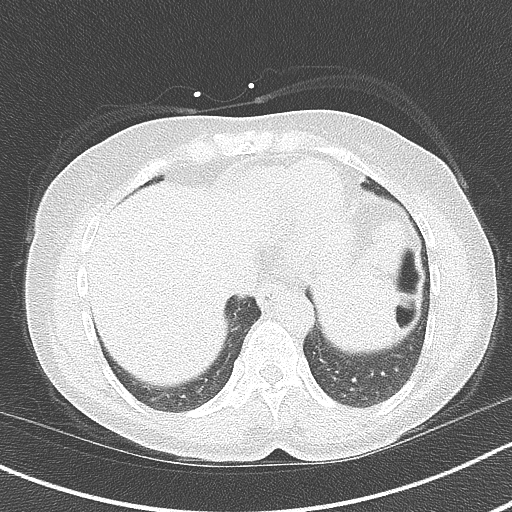
[im 13/37  lung]
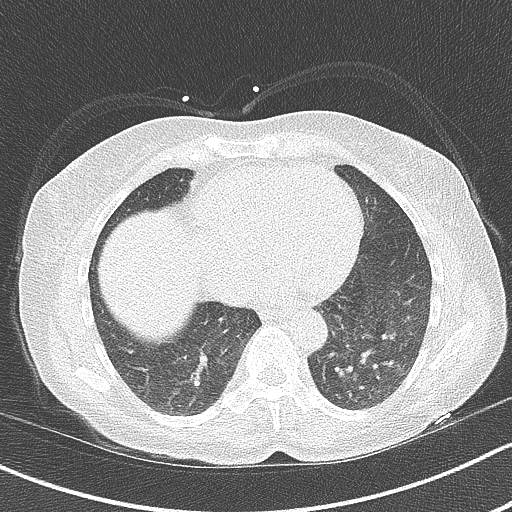
[im 19/37  lung]
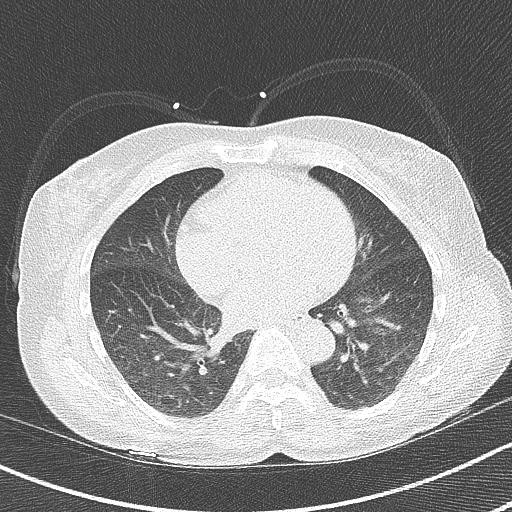
[im 25/37  lung]
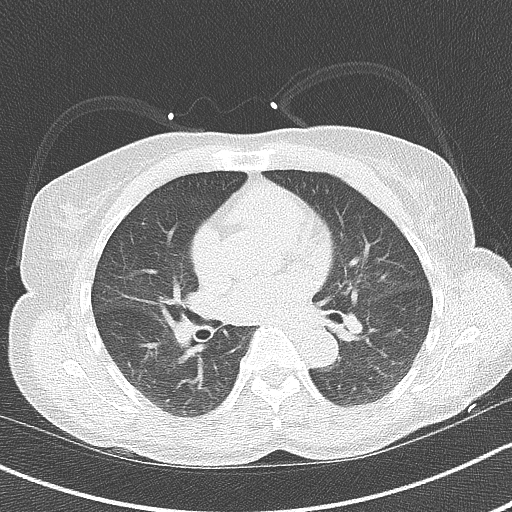
[im 31/37  lung]
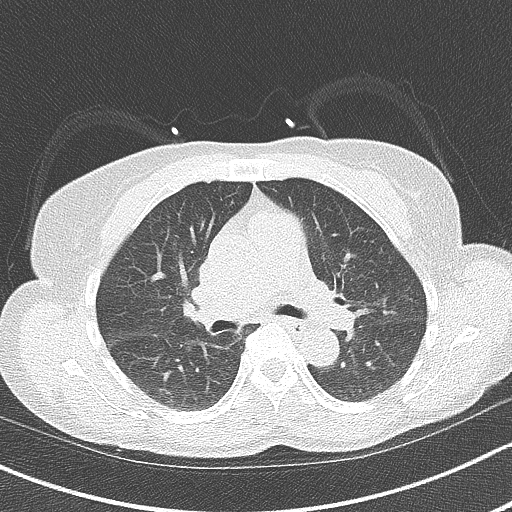

[Series 4: lung st 71 % · axial · 0.63mm/px · z∈[-194,-122]mm · 5 of 37 slices shown]
[im 7/37  lung]
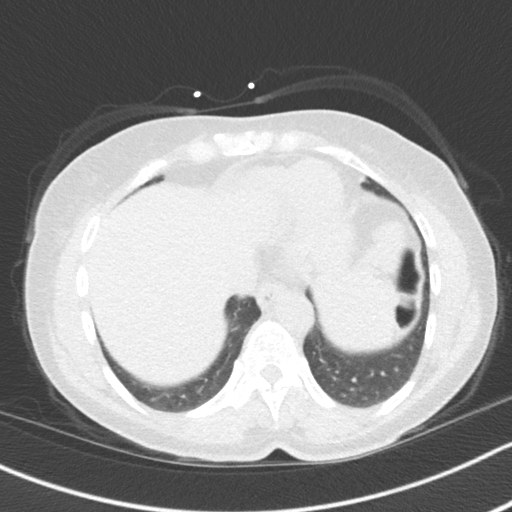
[im 13/37  lung]
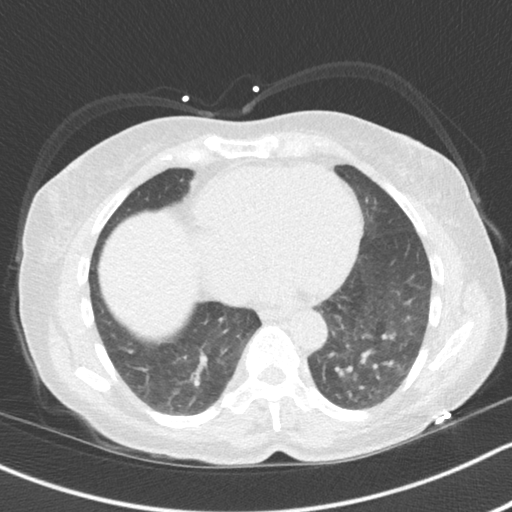
[im 19/37  lung]
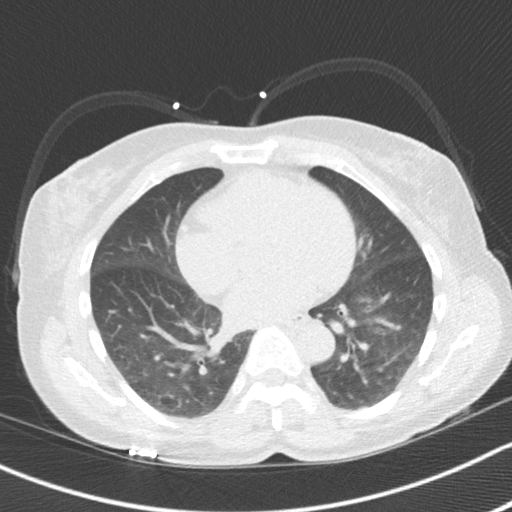
[im 25/37  lung]
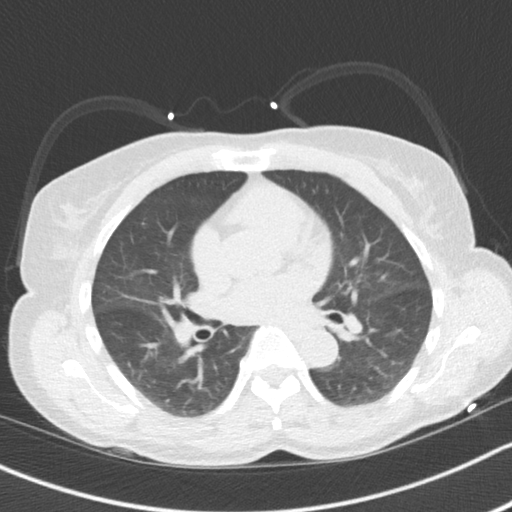
[im 31/37  lung]
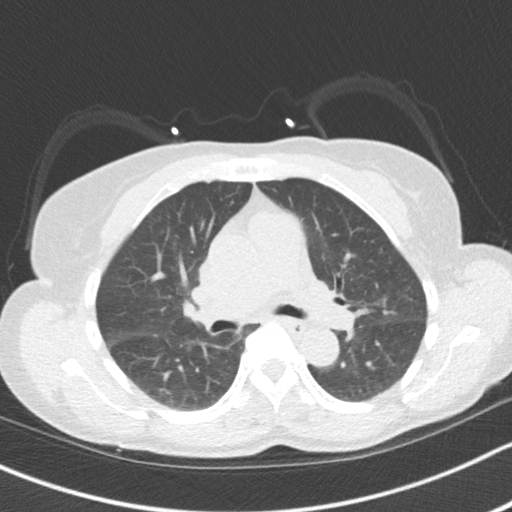

[14 of 20 positions shown; findings below may reference images not displayed]

FINDINGS: Atherosclerotic calcifications in the thoracic aorta. Within the
visualized portions of the thorax there are no suspicious appearing
pulmonary nodules or masses, there is no acute consolidative
airspace disease, no pleural effusions, no pneumothorax and no
lymphadenopathy. Visualized portions of the upper abdomen
demonstrates a incompletely imaged low-attenuation lesion in the
central liver measuring at least 11 mm, incompletely characterized
on today's noncontrast CT examination, but statistically likely to
represent a cyst. There are no aggressive appearing lytic or blastic
lesions noted in the visualized portions of the skeleton.
IMPRESSION: 1.  Aortic Atherosclerosis (D31C7-AFC.C).
FINDINGS: Coronary arteries: Normal origins.

Coronary Calcium Score:

Left main: 78

Left anterior descending artery: 0

Left circumflex artery: 0

Right coronary artery: 14

Total: 92

Percentile: 71

Pericardium: Normal.

Aorta: Normal caliber of ascending aorta. Scattered aortic
atherosclerosis noted.

Non-cardiac: See separate report from [REDACTED].
IMPRESSION: Coronary calcium score of 92. This was 71st percentile for age-,
race-, and sex-matched controls. Aortic atherosclerosis noted.



If CAC=0, it is reasonable to withhold statin therapy and reassess
in 5 to 10 years, as long as higher risk conditions are absent
(diabetes mellitus, family history of premature CHD in first degree
relatives (males <55 years; females <65 years), cigarette smoking,
or LDL >=190 mg/dL).

If CAC is 1 to 99, it is reasonable to initiate statin therapy for
patients >=55 years of age.

If CAC is >=100 or >=75th percentile, it is reasonable to initiate
statin therapy at any age.

Cardiology referral should be considered for patients with CAC
scores >=400 or >=75th percentile.

*7104 AHA/ACC/AACVPR/AAPA/ABC/PO[place]O/ABDULI/DONETTE/[place]/HORGAN/SENG/SABED
Guideline on the Management of Blood Cholesterol: A Report of the
American College of Cardiology/American Heart Association Task Force
on Clinical Practice Guidelines. J Am Coll Cardiol.
6374;73(24):6685-6565.

*** End of Addendum ***
EXAM:
OVER-READ INTERPRETATION  CT CHEST

The following report is an over-read performed by radiologist Dr.
Hp Boyko [REDACTED] on 02/07/2021. This
over-read does not include interpretation of cardiac or coronary
anatomy or pathology. The coronary calcium score interpretation by
the cardiologist is attached.
FINDINGS: Atherosclerotic calcifications in the thoracic aorta. Within the
visualized portions of the thorax there are no suspicious appearing
pulmonary nodules or masses, there is no acute consolidative
airspace disease, no pleural effusions, no pneumothorax and no
lymphadenopathy. Visualized portions of the upper abdomen
demonstrates a incompletely imaged low-attenuation lesion in the
central liver measuring at least 11 mm, incompletely characterized
on today's noncontrast CT examination, but statistically likely to
represent a cyst. There are no aggressive appearing lytic or blastic
lesions noted in the visualized portions of the skeleton.
IMPRESSION: 1.  Aortic Atherosclerosis (D31C7-AFC.C).

## 2021-11-18 DIAGNOSIS — Z20822 Contact with and (suspected) exposure to covid-19: Secondary | ICD-10-CM | POA: Diagnosis not present

## 2021-11-24 DIAGNOSIS — Z20822 Contact with and (suspected) exposure to covid-19: Secondary | ICD-10-CM | POA: Diagnosis not present

## 2021-12-11 ENCOUNTER — Other Ambulatory Visit: Payer: Self-pay | Admitting: Cardiology

## 2022-01-29 NOTE — Progress Notes (Signed)
Office Visit Note  Patient: Theresa Graham             Date of Birth: 1951-06-04           MRN: [id number]             PCP: Carol Ada, MD Referring: Carol Ada, MD Visit Date: 02/12/2022 Occupation: '@GUAROCC'$ @  Subjective:  Medication management  History of Present Illness: Theresa Graham is a 71 y.o. female with history of systemic lupus erythematosus, osteoarthritis and osteoporosis.  She states she had been doing well on hydroxychloroquine 1 tablet every other day.  She denies any history of oral ulcers, nasal ulcers, sicca symptoms, malar rash, Raynaud's phenomenon or lymphadenopathy.  She states that she has photosensitivity and she protects herself from the sun exposure.  She denies any discomfort in her knee joints today.  She has off-and-on lower back pain.  She states that she was placed back on Fosamax by her GYN.  She has muscle cramps at nighttime which causes insomnia.  Activities of Daily Living:  Patient reports morning stiffness for 5 minutes.   Patient Denies nocturnal pain.  Difficulty dressing/grooming: Denies Difficulty climbing stairs: Denies Difficulty getting out of chair: Denies Difficulty using hands for taps, buttons, cutlery, and/or writing: Denies  Review of Systems  Constitutional:  Positive for fatigue. Negative for night sweats, weight gain and weight loss.  HENT:  Negative for mouth sores, trouble swallowing, trouble swallowing, mouth dryness and nose dryness.   Eyes:  Negative for pain, redness, visual disturbance and dryness.  Respiratory:  Negative for cough, shortness of breath and difficulty breathing.   Cardiovascular:  Positive for swelling in legs/feet. Negative for chest pain, palpitations, hypertension and irregular heartbeat.  Gastrointestinal:  Positive for constipation. Negative for blood in stool and diarrhea.  Endocrine: Negative for increased urination.  Genitourinary:  Negative for vaginal dryness.  Musculoskeletal:  Positive  for morning stiffness. Negative for joint pain, joint pain, joint swelling, myalgias, muscle weakness, muscle tenderness and myalgias.  Skin:  Negative for color change, rash, hair loss, skin tightness, ulcers and sensitivity to sunlight.  Allergic/Immunologic: Negative for susceptible to infections.  Neurological:  Negative for dizziness, memory loss, night sweats and weakness.  Hematological:  Negative for swollen glands.  Psychiatric/Behavioral:  Positive for sleep disturbance. Negative for depressed mood. The patient is not nervous/anxious.     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   Age-related osteoporosis without current pathological fracture 08/04/2016   Systemic lupus erythematosus ([place]) 08/01/2016   Vitamin D deficiency 08/01/2016   History of IBS 08/01/2016   Plantar fasciitis 08/01/2016   Raynaud's disease without gangrene 08/01/2016   History of hypertension 08/01/2016   High risk medication use 06/18/2016   Hypertension    HSV (herpes simplex virus) infection    Headache(784.0)     Past Medical History:  Diagnosis Date   HSV (herpes simplex virus) infection    Hydrosalpinx    Hypertension    IBS (irritable bowel syndrome)    Insomnia    Lupus (HCC)    sle and discoid lupus   Lupus ([place])    Osteoporosis 2019   T score -2.8 statistically improved from prior study   Prediabetes    Raynaud's disease    Spinal stenosis    per patient    Systemic lupus erythematosus ([place])  Family History  Problem Relation Age of Onset   Cancer Mother    Diabetes Mother    Hypertension Mother    Thyroid disease Mother    Heart disease Mother    Breast cancer Mother        Age 47   Diabetes Father    Hypertension Father    Cancer Father        COLON   Heart disease Father    Crohn's disease Sister    Hypertension Sister     Stroke Sister    Cancer Sister        Intestinal Ca   Colitis Sister    Sarcoidosis Sister    Cancer Sister    Crohn's disease Sister    Hypertension Brother    Hypertension Maternal Grandmother    Diabetes Maternal Grandmother    Hypertension Paternal Grandmother    Diabetes Paternal Grandmother    Diabetes Paternal Grandfather    Healthy Son    Rheum arthritis Son    Past Surgical History:  Procedure Laterality Date   ABDOMINAL HYSTERECTOMY  1987   TAH   back injection  05/2020   FOOT SURGERY Left 2010   hysterectomy  1987   ROTATOR CUFF REPAIR  2006   SHOULDER SURGERY FOR BONE SPUR  2010   STRESS FRACTURE LEG  08/2007   TENDON RELEASE Left 01/07/2018   TUBAL LIGATION  1978   WRIST SURGERY Right 09/23/2018   tendon release    WRIST SURGERY Left 12/2017   Social History   Social History Narrative   Not on file   Immunization History  Administered Date(s) Administered   Influenza Split 05/05/2012   Influenza, High Dose Seasonal PF 04/25/2017, 04/19/2018, 03/09/2019   Influenza,inj,Quad PF,6+ Mos 05/13/2013, 05/17/2014   Influenza-Unspecified 04/25/2017   PFIZER(Purple Top)SARS-COV-2 Vaccination 08/16/2019, 09/06/2019, 03/23/2020, 10/22/2020   Zoster Recombinat (Shingrix) 05/27/2017     Objective: Vital Signs: BP 133/71 (BP Location: Left Arm, Patient Position: Sitting, Cuff Size: Normal)   Pulse 65   Resp 15   Ht 5' 4.5" (1.638 m)   Wt 150 lb 9.6 oz (68.3 kg)   BMI 25.45 kg/m    Physical Exam Vitals and nursing note reviewed.  Constitutional:      Appearance: She is well-developed.  HENT:     Head: Normocephalic and atraumatic.  Eyes:     Conjunctiva/sclera: Conjunctivae normal.  Cardiovascular:     Rate and Rhythm: Normal rate and regular rhythm.     Heart sounds: Normal heart sounds.  Pulmonary:     Effort: Pulmonary effort is normal.     Breath sounds: Normal breath sounds.  Abdominal:     General: Bowel sounds are normal.     Palpations:  Abdomen is soft.  Musculoskeletal:     Cervical back: Normal range of motion.  Lymphadenopathy:     Cervical: No cervical adenopathy.  Skin:    General: Skin is warm and dry.     Capillary Refill: Capillary refill takes less than 2 seconds.  Neurological:     Mental Status: She is alert and oriented to person, place, and time.  Psychiatric:        Behavior: Behavior normal.      Musculoskeletal Exam: C-spine was in good range of motion.  Shoulder joints, elbow joints, wrist joints, MCPs PIPs and DIPs with good range of motion.  She had thickening of right second and third MCP joints.  No synovitis was noted.  Hip joints  with good range of motion.  She had limited extension of her knee joints without warmth swelling or effusion.  There was no tenderness over ankles or MTPs.  CDAI Exam: CDAI Score: -- Patient Global: --; Provider Global: -- Swollen: 0 ; Tender: 0  Joint Exam 02/12/2022   No joint exam has been documented for this visit   There is currently no information documented on the homunculus. Go to the Rheumatology activity and complete the homunculus joint exam.  Investigation: No additional findings.  Imaging: No results found.  Recent Labs: Lab Results  Component Value Date   WBC 4.8 09/16/2021   HGB 14.1 09/16/2021   PLT 295 09/16/2021   NA 136 09/16/2021   K 3.6 09/16/2021   CL 98 09/16/2021   CO2 27 09/16/2021   GLUCOSE 92 09/16/2021   BUN 13 09/16/2021   CREATININE 0.93 09/16/2021   BILITOT 0.9 09/16/2021   ALKPHOS 69 08/04/2016   AST 16 09/16/2021   ALT 26 09/16/2021   PROT 7.9 09/16/2021   ALBUMIN 4.3 08/04/2016   CALCIUM 10.3 09/16/2021   GFRAA 70 05/31/2020    Speciality Comments: PLQ Eye Exam: 06/24/2021 WNL @ [facility] Ophthalmology follow up in 1 year Fosamax 07/2016  Procedures:  No procedures performed Allergies: Patient has no known allergies.   Assessment / Plan:     Visit Diagnoses: Other systemic lupus erythematosus with other  organ involvement (HCC) - Positive ANA, positive Smith, positive RNP, positive Ro, positive Raynauds, positive photosensitivity, positive arthritis, DLE:  -She gives history of photosensitivity.  She denies any history of oral ulcers, nasal ulcers, malar rash, Raynaud's phenomenon or lymphadenopathy.  She denies any history of inflammatory arthritis.  She has been taking hydroxychloroquine 1 tablet every other day which she has been tolerating well.  She has not had any recent rash.  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  High risk medication use - Plaquenil 200 mg 1 tablet by mouth every other day.PLQ Eye Exam: 06/24/2021  -Labs obtained on September 16, 2021 were reviewed which were within normal limits except for positive ANA.  Will obtain autoimmune labs today.  Information on immunization was placed in the AVS.  Plan: CBC with Differential/Platelet, COMPLETE METABOLIC PANEL WITH GFR  Raynaud's syndrome without gangrene-no recent episodes.  Primary osteoarthritis of both knees-she had limited extension of her knee joints.  No warmth swelling or effusion was noted.  DDD (degenerative disc disease), lumbar-she had good mobility in her lumbar spine without discomfort.  Age-related osteoporosis without current pathological fracture - DEXA on 08/01/20: revealed an improvement in the spine from -2.8 to -1.6.  She restarted Fosamax per recommendations of her GYN.  She has been on Fosamax since 2018.  I advised her to stop Fosamax due to increased risk of atypical fracture.  She will have repeat DEXA scan in January 2024.  Vitamin D deficiency-she has been taking vitamin D supplement.  Vitamin D level was 45 on September 16, 2021.  History of IBS-she mostly has constipation.  History of hypertension-blood pressure  was normal today.  Muscle cramps-use of magnesium malate 250 mg at bedtime was advised.  Orders: Orders Placed This Encounter  Procedures   Protein / creatinine ratio, urine   CBC with Differential/Platelet   COMPLETE METABOLIC PANEL WITH GFR   ANA   Anti-DNA antibody, double-stranded   C3 and C4  Sedimentation rate   Sjogrens syndrome-A extractable nuclear antibody   RNP Antibody   Anti-Smith antibody   No orders of the defined types were placed in this encounter.    Follow-Up Instructions: Return in about 5 months (around 07/15/2022) for Systemic lupus, Osteoarthritis, Osteoporosis.   Bo Merino, MD  Note - This record has been created using Editor, commissioning.  Chart creation errors have been sought, but may not always  have been located. Such creation errors do not reflect on  the standard of medical care.

## 2022-02-12 ENCOUNTER — Encounter: Payer: Self-pay | Admitting: Rheumatology

## 2022-02-12 ENCOUNTER — Ambulatory Visit (INDEPENDENT_AMBULATORY_CARE_PROVIDER_SITE_OTHER): Payer: Medicare Other | Admitting: Rheumatology

## 2022-02-12 VITALS — BP 133/71 | HR 65 | Resp 15 | Ht 64.5 in | Wt 150.6 lb

## 2022-02-12 DIAGNOSIS — M3219 Other organ or system involvement in systemic lupus erythematosus: Secondary | ICD-10-CM

## 2022-02-12 DIAGNOSIS — I7300 Raynaud's syndrome without gangrene: Secondary | ICD-10-CM | POA: Diagnosis not present

## 2022-02-12 DIAGNOSIS — L819 Disorder of pigmentation, unspecified: Secondary | ICD-10-CM

## 2022-02-12 DIAGNOSIS — M170 Bilateral primary osteoarthritis of knee: Secondary | ICD-10-CM

## 2022-02-12 DIAGNOSIS — Z79899 Other long term (current) drug therapy: Secondary | ICD-10-CM | POA: Diagnosis not present

## 2022-02-12 DIAGNOSIS — Z8719 Personal history of other diseases of the digestive system: Secondary | ICD-10-CM

## 2022-02-12 DIAGNOSIS — M5136 Other intervertebral disc degeneration, lumbar region: Secondary | ICD-10-CM

## 2022-02-12 DIAGNOSIS — I2510 Atherosclerotic heart disease of native coronary artery without angina pectoris: Secondary | ICD-10-CM

## 2022-02-12 DIAGNOSIS — M810 Age-related osteoporosis without current pathological fracture: Secondary | ICD-10-CM

## 2022-02-12 DIAGNOSIS — E559 Vitamin D deficiency, unspecified: Secondary | ICD-10-CM

## 2022-02-12 DIAGNOSIS — I2584 Coronary atherosclerosis due to calcified coronary lesion: Secondary | ICD-10-CM

## 2022-02-12 DIAGNOSIS — L930 Discoid lupus erythematosus: Secondary | ICD-10-CM | POA: Diagnosis not present

## 2022-02-12 DIAGNOSIS — Z8679 Personal history of other diseases of the circulatory system: Secondary | ICD-10-CM

## 2022-02-12 DIAGNOSIS — R252 Cramp and spasm: Secondary | ICD-10-CM

## 2022-02-12 NOTE — Patient Instructions (Addendum)
Vaccines You are taking a medication(s) that can suppress your immune system.  The following immunizations are recommended: Flu annually Covid-19  Td/Tdap (tetanus, diphtheria, pertussis) every 10 years Pneumonia (Prevnar 15 then Pneumovax 23 at least 1 year apart.  Alternatively, can take Prevnar 20 without needing additional dose) Shingrix: 2 doses from 4 weeks to 6 months apart  Please check with your PCP to make sure you are up to date.      Magnesium malate 250 mg at bedtime for muscle cramps

## 2022-02-14 LAB — ANTI-NUCLEAR AB-TITER (ANA TITER): ANA Titer 1: 1:80 {titer} — ABNORMAL HIGH

## 2022-02-14 LAB — COMPLETE METABOLIC PANEL WITH GFR
AG Ratio: 1.9 (calc) (ref 1.0–2.5)
ALT: 32 U/L — ABNORMAL HIGH (ref 6–29)
AST: 15 U/L (ref 10–35)
Albumin: 4.7 g/dL (ref 3.6–5.1)
Alkaline phosphatase (APISO): 49 U/L (ref 37–153)
BUN: 24 mg/dL (ref 7–25)
CO2: 27 mmol/L (ref 20–32)
Calcium: 9.7 mg/dL (ref 8.6–10.4)
Chloride: 103 mmol/L (ref 98–110)
Creat: 0.89 mg/dL (ref 0.60–1.00)
Globulin: 2.5 g/dL (calc) (ref 1.9–3.7)
Glucose, Bld: 91 mg/dL (ref 65–99)
Potassium: 4.5 mmol/L (ref 3.5–5.3)
Sodium: 138 mmol/L (ref 135–146)
Total Bilirubin: 0.6 mg/dL (ref 0.2–1.2)
Total Protein: 7.2 g/dL (ref 6.1–8.1)
eGFR: 69 mL/min/{1.73_m2} (ref >=60)

## 2022-02-14 LAB — CBC WITH DIFFERENTIAL/PLATELET
Absolute Monocytes: 554 cells/uL (ref 200–950)
Basophils Absolute: 62 cells/uL (ref 0–200)
Basophils Relative: 1.1 %
Eosinophils Absolute: 280 cells/uL (ref 15–500)
Eosinophils Relative: 5 %
HCT: 40.1 % (ref 35.0–45.0)
Hemoglobin: 13.4 g/dL (ref 11.7–15.5)
Lymphs Abs: 1562 cells/uL (ref 850–3900)
MCH: 29.5 pg (ref 27.0–33.0)
MCHC: 33.4 g/dL (ref 32.0–36.0)
MCV: 88.3 fL (ref 80.0–100.0)
MPV: 10.3 fL (ref 7.5–12.5)
Monocytes Relative: 9.9 %
Neutro Abs: 3142 cells/uL (ref 1500–7800)
Neutrophils Relative %: 56.1 %
Platelets: 257 10*3/uL (ref 140–400)
RBC: 4.54 10*6/uL (ref 3.80–5.10)
RDW: 13 % (ref 11.0–15.0)
Total Lymphocyte: 27.9 %
WBC: 5.6 10*3/uL (ref 3.8–10.8)

## 2022-02-14 LAB — PROTEIN / CREATININE RATIO, URINE
Creatinine, Urine: 52 mg/dL (ref 20–275)
Protein/Creat Ratio: 96 mg/g creat (ref 24–184)
Protein/Creatinine Ratio: 0.096 mg/mg creat (ref 0.024–0.184)
Total Protein, Urine: 5 mg/dL (ref 5–24)

## 2022-02-14 LAB — SJOGRENS SYNDROME-A EXTRACTABLE NUCLEAR ANTIBODY: SSA (Ro) (ENA) Antibody, IgG: 3.1 AI — AB

## 2022-02-14 LAB — ANTI-SMITH ANTIBODY: ENA SM Ab Ser-aCnc: <1 AI

## 2022-02-14 LAB — RNP ANTIBODY: Ribonucleic Protein(ENA) Antibody, IgG: <1 AI

## 2022-02-14 LAB — C3 AND C4
C3 Complement: 121 mg/dL (ref 83–193)
C4 Complement: 25 mg/dL (ref 15–57)

## 2022-02-14 LAB — ANA: Anti Nuclear Antibody (ANA): POSITIVE — AB

## 2022-02-14 LAB — SEDIMENTATION RATE: Sed Rate: 6 mm/h (ref 0–30)

## 2022-02-14 LAB — ANTI-DNA ANTIBODY, DOUBLE-STRANDED: ds DNA Ab: <1 IU/mL

## 2022-02-16 NOTE — Progress Notes (Signed)
CBC, sedimentation rate and CMP are normal except mildly elevated liver functions.  Anti-Ro antibodies positive, ANA is low titer positive.  Anti-Smith antibody negative, urine protein negative, complements normal, sed rate normal RNP negative.  No change in treatment advised.

## 2022-03-04 DIAGNOSIS — Z974 Presence of external hearing-aid: Secondary | ICD-10-CM | POA: Diagnosis not present

## 2022-03-04 DIAGNOSIS — H6121 Impacted cerumen, right ear: Secondary | ICD-10-CM | POA: Diagnosis not present

## 2022-03-17 DIAGNOSIS — D2371 Other benign neoplasm of skin of right lower limb, including hip: Secondary | ICD-10-CM | POA: Diagnosis not present

## 2022-03-17 DIAGNOSIS — M205X1 Other deformities of toe(s) (acquired), right foot: Secondary | ICD-10-CM | POA: Diagnosis not present

## 2022-03-18 DIAGNOSIS — Z8379 Family history of other diseases of the digestive system: Secondary | ICD-10-CM | POA: Diagnosis not present

## 2022-03-18 DIAGNOSIS — K5730 Diverticulosis of large intestine without perforation or abscess without bleeding: Secondary | ICD-10-CM | POA: Diagnosis not present

## 2022-03-18 DIAGNOSIS — K5904 Chronic idiopathic constipation: Secondary | ICD-10-CM | POA: Diagnosis not present

## 2022-04-14 DIAGNOSIS — Z23 Encounter for immunization: Secondary | ICD-10-CM | POA: Diagnosis not present

## 2022-04-23 ENCOUNTER — Other Ambulatory Visit: Payer: Self-pay | Admitting: Rheumatology

## 2022-04-23 DIAGNOSIS — D225 Melanocytic nevi of trunk: Secondary | ICD-10-CM | POA: Diagnosis not present

## 2022-04-23 DIAGNOSIS — L821 Other seborrheic keratosis: Secondary | ICD-10-CM | POA: Diagnosis not present

## 2022-04-23 DIAGNOSIS — Z23 Encounter for immunization: Secondary | ICD-10-CM | POA: Diagnosis not present

## 2022-04-23 DIAGNOSIS — I10 Essential (primary) hypertension: Secondary | ICD-10-CM | POA: Diagnosis not present

## 2022-04-23 DIAGNOSIS — R7303 Prediabetes: Secondary | ICD-10-CM | POA: Diagnosis not present

## 2022-04-23 DIAGNOSIS — D2271 Melanocytic nevi of right lower limb, including hip: Secondary | ICD-10-CM | POA: Diagnosis not present

## 2022-04-23 DIAGNOSIS — E7800 Pure hypercholesterolemia, unspecified: Secondary | ICD-10-CM | POA: Diagnosis not present

## 2022-04-23 DIAGNOSIS — Z85828 Personal history of other malignant neoplasm of skin: Secondary | ICD-10-CM | POA: Diagnosis not present

## 2022-04-23 DIAGNOSIS — L819 Disorder of pigmentation, unspecified: Secondary | ICD-10-CM | POA: Diagnosis not present

## 2022-04-23 DIAGNOSIS — H02729 Madarosis of unspecified eye, unspecified eyelid and periocular area: Secondary | ICD-10-CM | POA: Diagnosis not present

## 2022-04-23 DIAGNOSIS — I2510 Atherosclerotic heart disease of native coronary artery without angina pectoris: Secondary | ICD-10-CM | POA: Diagnosis not present

## 2022-04-23 NOTE — Telephone Encounter (Signed)
Next Visit: 07/23/2022  Last Visit: 02/12/2022  Labs: 02/12/2022 CBC, sedimentation rate and CMP are normal except mildly elevated liver functions  Eye exam: 06/24/2021 WNL    Current Dose per office note 02/12/2022: Plaquenil 200 mg 1 tablet by mouth every other day  DX: Other systemic lupus erythematosus with other organ involvement   Last Fill: 06/18/2021  Okay to refill Plaquenil?

## 2022-05-08 DIAGNOSIS — J329 Chronic sinusitis, unspecified: Secondary | ICD-10-CM | POA: Diagnosis not present

## 2022-05-29 DIAGNOSIS — R944 Abnormal results of kidney function studies: Secondary | ICD-10-CM | POA: Diagnosis not present

## 2022-07-10 NOTE — Progress Notes (Signed)
Office Visit Note  Patient: Theresa Graham             Date of Birth: 08-29-1950           MRN: [id number]             PCP: Carol Ada, MD Referring: Carol Ada, MD Visit Date: 07/23/2022 Occupation: _0 @  Subjective:  Stiffness in both hands  History of Present Illness: Theresa Graham is a 71 y.o. female with history of systemic lupus erythematosus.  Patient is currently taking Plaquenil 200 mg 1 tablet by mouth every other day.  She has been tolerating Plaquenil without any side effects.  Patient presents today with increased stiffness in both hands which started about 2 to 3 months ago.  She has not had any tenderness or joint swelling but does have some irritation with repetitive or overuse activities.  She has noticed decreased grip strength and weakness in both hands.  She continues to have intermittent discomfort in both knee joints but denies any inflammation.  She uses Voltaren gel topically as needed for pain relief.  She denies any other signs or symptoms of a systemic lupus flare.  She has not had any recent discoid lesions or hair loss.  She has photosensitivity but avoids direct sun exposure wearing a hat, longsleeves, and sunscreen if she plans on being outdoors.  She has intermittent symptoms of Raynaud's phenomenon but denies any digital ulcerations.  She has not had any shortness of breath, pleuritic chest pain, palpitation.  Her energy level has been stable.  She denies any sores in her mouth or nose.  She has not had any sicca symptoms.    Activities of Daily Living:  Patient reports morning stiffness for 5 minutes.   Patient Denies nocturnal pain.  Difficulty dressing/grooming: Denies Difficulty climbing stairs: Denies Difficulty getting out of chair: Denies Difficulty using hands for taps, buttons, cutlery, and/or writing: Reports  Review of Systems  Constitutional:  Positive for fatigue.  HENT:  Negative for mouth sores and mouth dryness.   Eyes:   Negative for dryness.  Respiratory:  Negative for shortness of breath.   Cardiovascular:  Negative for chest pain and palpitations.  Gastrointestinal:  Positive for constipation. Negative for blood in stool and diarrhea.  Endocrine: Negative for increased urination.  Genitourinary:  Negative for involuntary urination.  Musculoskeletal:  Positive for joint pain, joint pain, muscle weakness and morning stiffness. Negative for gait problem, joint swelling, myalgias, muscle tenderness and myalgias.  Skin:  Positive for color change. Negative for rash, hair loss and sensitivity to sunlight.  Allergic/Immunologic: Negative for susceptible to infections.  Neurological:  Negative for dizziness and headaches.  Hematological:  Negative for swollen glands.  Psychiatric/Behavioral:  Positive for sleep disturbance. Negative for depressed mood. The patient is not nervous/anxious.     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   Age-related osteoporosis without current pathological fracture 08/04/2016   Systemic lupus erythematosus (Theresa Graham) 08/01/2016   Vitamin D deficiency 08/01/2016   History of IBS 08/01/2016   Plantar fasciitis 08/01/2016   Raynaud's disease without gangrene 08/01/2016   History of hypertension 08/01/2016   High risk medication use 06/18/2016   Hypertension    HSV (herpes simplex virus) infection    Headache(784.0)     Past Medical History:  Diagnosis Date   HSV (herpes simplex  virus) infection    Hydrosalpinx    Hypertension    IBS (irritable bowel syndrome)    Insomnia    Lupus (HCC)    sle and discoid lupus   Lupus ([place])    Osteoporosis 2019   T score -2.8 statistically improved from prior study   Prediabetes    Raynaud's disease    Spinal stenosis    per patient    Systemic lupus erythematosus (Theresa Graham)     Family History   Problem Relation Age of Onset   Cancer Mother    Diabetes Mother    Hypertension Mother    Thyroid disease Mother    Heart disease Mother    Breast cancer Mother        Age 1   Diabetes Father    Hypertension Father    Cancer Father        COLON   Heart disease Father    Crohn's disease Sister    Hypertension Sister    Stroke Sister    Cancer Sister        Intestinal Ca   Colitis Sister    Sarcoidosis Sister    Cancer Sister    Crohn's disease Sister    Hypertension Brother    Hypertension Brother    Paget's disease of bone Brother    Hypertension Maternal Grandmother    Diabetes Maternal Grandmother    Hypertension Paternal Grandmother    Diabetes Paternal Grandmother    Diabetes Paternal Grandfather    Healthy Son    Rheum arthritis Son    Past Surgical History:  Procedure Laterality Date   ABDOMINAL HYSTERECTOMY  1987   TAH   back injection  05/2020   FOOT SURGERY Left 2010   hysterectomy  1987   ROTATOR CUFF REPAIR  2006   SHOULDER SURGERY FOR BONE SPUR  2010   STRESS FRACTURE LEG  08/2007   TENDON RELEASE Left 01/07/2018   TUBAL LIGATION  1978   WRIST SURGERY Right 09/23/2018   tendon release    WRIST SURGERY Left 12/2017   Social History   Social History Narrative   Not on file   Immunization History  Administered Date(s) Administered   Influenza Split 05/05/2012   Influenza, High Dose Seasonal PF 04/25/2017, 04/19/2018, 03/09/2019   Influenza,inj,Quad PF,6+ Mos 05/13/2013, 05/17/2014   Influenza-Unspecified 04/25/2017   PFIZER(Purple Top)SARS-COV-2 Vaccination 08/16/2019, 09/06/2019, 03/23/2020, 10/22/2020   Zoster Recombinat (Shingrix) 05/27/2017     Objective: Vital Signs: BP 125/79 (BP Location: Left Arm, Patient Position: Sitting, Cuff Size: Normal)   Pulse 67   Resp 15   Ht _0  (1.626 m)   Wt 152 lb 9.6 oz (69.2 kg)   BMI 26.19 kg/m    Physical Exam Vitals and nursing note reviewed.  Constitutional:      Appearance: She is  well-developed.  HENT:     Head: Normocephalic and atraumatic.  Eyes:     Conjunctiva/sclera: Conjunctivae normal.  Cardiovascular:     Rate and Rhythm: Normal rate and regular rhythm.     Heart sounds: Normal heart sounds.  Pulmonary:     Effort: Pulmonary effort is normal.     Breath sounds: Normal breath sounds.  Abdominal:     General: Bowel sounds are normal.     Palpations: Abdomen is soft.  Musculoskeletal:     Cervical back: Normal range of motion.  Skin:    General: Skin is warm and dry.     Capillary Refill: Capillary refill  takes less than 2 seconds.  Neurological:     Mental Status: She is alert and oriented to person, place, and time.  Psychiatric:        Behavior: Behavior normal.      Musculoskeletal Exam: C-spine, thoracic spine, lumbar spine have good range of motion.  No midline spinal tenderness or SI joint tenderness.  Shoulder joints, elbow joints, and wrist joints have good range of motion.  No tenderness or synovitis over MCP joints.  Some synovial thickening over the right second and third MCP joints noted.  Complete fist formation bilaterally.  Hip joints have good range of motion with no groin pain.  Knee joints have good range of motion with no warmth or effusion.  Ankle joints have good range of motion with no tenderness or joint swelling.  CDAI Exam: CDAI Score: -- Patient Global: --; Provider Global: -- Swollen: --; Tender: -- Joint Exam 07/23/2022   No joint exam has been documented for this visit   There is currently no information documented on the homunculus. Go to the Rheumatology activity and complete the homunculus joint exam.  Investigation: No additional findings.  Imaging: No results found.  Recent Labs: Lab Results  Component Value Date   WBC 5.6 02/12/2022   HGB 13.4 02/12/2022   PLT 257 02/12/2022   NA 138 02/12/2022   K 4.5 02/12/2022   CL 103 02/12/2022   CO2 27 02/12/2022   GLUCOSE 91 02/12/2022   BUN 24 02/12/2022    CREATININE 0.89 02/12/2022   BILITOT 0.6 02/12/2022   ALKPHOS 69 08/04/2016   AST 15 02/12/2022   ALT 32 (H) 02/12/2022   PROT 7.2 02/12/2022   ALBUMIN 4.3 08/04/2016   CALCIUM 9.7 02/12/2022   GFRAA 70 05/31/2020    Speciality Comments: PLQ Eye Exam: 06/24/2021 WNL @ [facility] follow up in 1 year Fosamax 07/2016  Procedures:  No procedures performed Allergies: Latex     Assessment / Plan:     Visit Diagnoses: Other systemic lupus erythematosus with other organ involvement ([place]) - Positive ANA, positive Smith, positive RNP, positive Ro, positive Raynauds, positive photosensitivity, positive arthritis, DLE: She has not had any signs or symptoms of a systemic lupus flare.  She has clinically been doing well taking Plaquenil 200 mg 1 tablet every other day.  She continues to tolerate Plaquenil without any side effects and has not missed any doses recently.  She has been experiencing increased stiffness in both hands but has not had any tenderness or synovitis.  X-rays of both hands were updated today to assess for radiographic progression.  The following lab work will also be updated for further evaluation.  She was given a list of natural anti-inflammatories to start taking.  If her symptoms persist or worsen I would recommend scheduling an ultrasound to assess for synovitis.  She is not experiencing any other joint pain or inflammation at this time. She has not had any recent rashes, hair loss, discoid lesions, oral or nasal ulcerations, sicca symptoms, cervical lymphadenopathy, shortness of breath, or pleuritic chest pain.  He has been avoiding direct sun exposure as advised.  She has intermittent symptoms of Raynaud's phenomenon but no digital ulcerations. Lab work updated on 02/12/22: ANA 1: 80 NH, Smith antibody negative, RNP antibody negative, ESR within normal limits, complements within normal limits, double-stranded DNA negative, Ro antibody 3.1, protein creatinine ratio  within normal limits, CBC within normal limits.  The following lab work will be obtained today for further evaluation.  She will remain on Plaquenil as prescribed.  A refill was sent to the pharmacy today.  She was advised to notify us if she develops signs or symptoms of a flare. 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  She avoids direct sun exposure.  She remains on Plaquenil as prescribed.  High risk medication use -Plaquenil 200 mg 1 tablet by mouth every other day.   PLQ Eye Exam: 06/24/2021.  CBC and CMP were drawn on 02/12/2022.  Orders for CBC and CMP were released today.- Plan: CBC with Differential/Platelet, COMPLETE METABOLIC PANEL WITH GFR  Raynaud's syndrome without gangrene: She continues to have intermittent symptoms of Raynaud's phenomenon.  No digital ulcerations or signs of gangrene were noted.  Discussed the importance of keeping her core body temperature warm as well as wearing gloves and socks.  Stiffness of joints of both hands -She has been experiencing increased stiffness and tightness in both hands for the past 2 to 3 months.  She has not had any tenderness, discomfort, or joint swelling.  In the morning she has difficulty making a complete fist due to the stiffness.   She had an ultrasound of both hands on 01/19/2019 which was negative for synovitis.  X-rays of both hands were obtained today for further evaluation.  The following lab work was also obtained today.  Discussed the importance of joint protection and muscle strengthening.  She was given a handout of hand exercises to perform.  She was also given a list of natural anti-inflammatories to start taking.  She is already been taking turmeric on a daily basis which she tolerates.  She was  advised to notify us if she develops any increased joint pain or joint swelling.  Plan: Sedimentation rate, C-reactive protein, Rheumatoid factor, Cyclic citrul peptide antibody, IgG, XR Hand 2 View Right, XR Hand 2 View Left  Primary osteoarthritis of both knees: She has good range of motion of both knee joints on examination today.  No warmth or effusion noted.  She experiences intermittent discomfort in both knee joints and uses Voltaren gel topically as needed for pain relief.  DDD (degenerative disc disease), lumbar: No midline spinal tenderness at this time.  Age-related osteoporosis without current pathological fracture - DEXA on 08/01/20: revealed an improvement in the spine from -2.8 to -1.6.  She is taking a calcium and vitamin D supplement daily.  She is no longer taking Fosamax.  Vitamin D deficiency: She is taking vitamin D 5000 units daily.  Other medical conditions are listed as follows:  History of hypertension: Blood pressure is 125/79 today in the office.  History of IBS  Muscle cramps   Orders: Orders Placed This Encounter  Procedures   XR Hand 2 View Right   XR Hand 2 View Left   CBC with Differential/Platelet   COMPLETE METABOLIC PANEL WITH GFR   Protein / creatinine ratio, urine   Anti-DNA antibody, double-stranded   C3 and C4   Sedimentation rate   C-reactive protein   Rheumatoid factor   Cyclic citrul peptide antibody, IgG   Meds ordered this encounter  Medications   hydroxychloroquine (PLAQUENIL) 200 MG tablet    Sig: Take 1 tablet (200 mg total) by mouth every other day.    Dispense:  45 tablet    Refill:  0  Follow-Up Instructions: Return in about 5 months (around 12/22/2022) for Systemic lupus erythematosus.   Ofilia Neas, PA-C  Note - This record has been created using Dragon software.  Chart creation errors have been sought, but may not always  have been located. Such creation errors do not reflect on  the standard of medical care.

## 2022-07-23 ENCOUNTER — Ambulatory Visit (INDEPENDENT_AMBULATORY_CARE_PROVIDER_SITE_OTHER): Payer: Medicare Other

## 2022-07-23 ENCOUNTER — Ambulatory Visit: Payer: Medicare Other

## 2022-07-23 ENCOUNTER — Encounter: Payer: Self-pay | Admitting: Physician Assistant

## 2022-07-23 ENCOUNTER — Ambulatory Visit: Payer: Medicare Other | Attending: Physician Assistant | Admitting: Physician Assistant

## 2022-07-23 VITALS — BP 125/79 | HR 67 | Resp 15 | Ht 64.0 in | Wt 152.6 lb

## 2022-07-23 DIAGNOSIS — M5136 Other intervertebral disc degeneration, lumbar region: Secondary | ICD-10-CM | POA: Insufficient documentation

## 2022-07-23 DIAGNOSIS — Z8679 Personal history of other diseases of the circulatory system: Secondary | ICD-10-CM | POA: Insufficient documentation

## 2022-07-23 DIAGNOSIS — M810 Age-related osteoporosis without current pathological fracture: Secondary | ICD-10-CM | POA: Insufficient documentation

## 2022-07-23 DIAGNOSIS — M3219 Other organ or system involvement in systemic lupus erythematosus: Secondary | ICD-10-CM | POA: Insufficient documentation

## 2022-07-23 DIAGNOSIS — M170 Bilateral primary osteoarthritis of knee: Secondary | ICD-10-CM | POA: Diagnosis not present

## 2022-07-23 DIAGNOSIS — Z79899 Other long term (current) drug therapy: Secondary | ICD-10-CM | POA: Insufficient documentation

## 2022-07-23 DIAGNOSIS — R252 Cramp and spasm: Secondary | ICD-10-CM | POA: Insufficient documentation

## 2022-07-23 DIAGNOSIS — M25642 Stiffness of left hand, not elsewhere classified: Secondary | ICD-10-CM

## 2022-07-23 DIAGNOSIS — I7300 Raynaud's syndrome without gangrene: Secondary | ICD-10-CM | POA: Diagnosis not present

## 2022-07-23 DIAGNOSIS — M25641 Stiffness of right hand, not elsewhere classified: Secondary | ICD-10-CM | POA: Diagnosis not present

## 2022-07-23 DIAGNOSIS — L930 Discoid lupus erythematosus: Secondary | ICD-10-CM | POA: Insufficient documentation

## 2022-07-23 DIAGNOSIS — E559 Vitamin D deficiency, unspecified: Secondary | ICD-10-CM | POA: Insufficient documentation

## 2022-07-23 DIAGNOSIS — Z8719 Personal history of other diseases of the digestive system: Secondary | ICD-10-CM | POA: Diagnosis not present

## 2022-07-23 MED ORDER — HYDROXYCHLOROQUINE SULFATE 200 MG PO TABS: 200.0000 mg | ORAL_TABLET | ORAL | 0 refills | Status: DC

## 2022-07-23 NOTE — Progress Notes (Signed)
X-rays of both hands are consistent with osteoarthritis and inflammatory arthritis overlap.   Cystic changes noted in right 2nd and 3rd MCP joints.   No comparison XR were available.   If the patient continues to have joint stiffness or develops increased joint pain I would recommend scheduling an ultrasound of both hands.

## 2022-07-23 NOTE — Patient Instructions (Signed)
Hand Exercises Hand exercises can be helpful for almost anyone. These exercises can strengthen the hands, improve flexibility and movement, and increase blood flow to the hands. These results can make work and daily tasks easier. Hand exercises can be especially helpful for people who have joint pain from arthritis or have nerve damage from overuse (carpal tunnel syndrome). These exercises can also help people who have injured a hand. Exercises Most of these hand exercises are gentle stretching and motion exercises. It is usually safe to do them often throughout the day. Warming up your hands before exercise may help to reduce stiffness. You can do this with gentle massage or by placing your hands in warm water for 10-15 minutes. It is normal to feel some stretching, pulling, tightness, or mild discomfort as you begin new exercises. This will gradually improve. Stop an exercise right away if you feel sudden, severe pain or your pain gets worse. Ask your health care provider which exercises are best for you. Knuckle bend or "claw" fist  Stand or sit with your arm, hand, and all five fingers pointed straight up. Make sure to keep your wrist straight during the exercise. Gently bend your fingers down toward your palm until the tips of your fingers are touching the top of your palm. Keep your big knuckle straight and just bend the small knuckles in your fingers. Hold this position for __________ seconds. Straighten (extend) your fingers back to the starting position. Repeat this exercise 5-10 times with each hand. Full finger fist  Stand or sit with your arm, hand, and all five fingers pointed straight up. Make sure to keep your wrist straight during the exercise. Gently bend your fingers into your palm until the tips of your fingers are touching the middle of your palm. Hold this position for __________ seconds. Extend your fingers back to the starting position, stretching every joint fully. Repeat  this exercise 5-10 times with each hand. Straight fist Stand or sit with your arm, hand, and all five fingers pointed straight up. Make sure to keep your wrist straight during the exercise. Gently bend your fingers at the big knuckle, where your fingers meet your hand, and the middle knuckle. Keep the knuckle at the tips of your fingers straight and try to touch the bottom of your palm. Hold this position for __________ seconds. Extend your fingers back to the starting position, stretching every joint fully. Repeat this exercise 5-10 times with each hand. Tabletop  Stand or sit with your arm, hand, and all five fingers pointed straight up. Make sure to keep your wrist straight during the exercise. Gently bend your fingers at the big knuckle, where your fingers meet your hand, as far down as you can while keeping the small knuckles in your fingers straight. Think of forming a tabletop with your fingers. Hold this position for __________ seconds. Extend your fingers back to the starting position, stretching every joint fully. Repeat this exercise 5-10 times with each hand. Finger spread  Place your hand flat on a table with your palm facing down. Make sure your wrist stays straight as you do this exercise. Spread your fingers and thumb apart from each other as far as you can until you feel a gentle stretch. Hold this position for __________ seconds. Bring your fingers and thumb tight together again. Hold this position for __________ seconds. Repeat this exercise 5-10 times with each hand. Making circles  Stand or sit with your arm, hand, and all five fingers pointed   straight up. Make sure to keep your wrist straight during the exercise. Make a circle by touching the tip of your thumb to the tip of your index finger. Hold for __________ seconds. Then open your hand wide. Repeat this motion with your thumb and each finger on your hand. Repeat this exercise 5-10 times with each hand. Thumb  motion  Sit with your forearm resting on a table and your wrist straight. Your thumb should be facing up toward the ceiling. Keep your fingers relaxed as you move your thumb. Lift your thumb up as high as you can toward the ceiling. Hold for __________ seconds. Bend your thumb across your palm as far as you can, reaching the tip of your thumb for the small finger (pinkie) side of your palm. Hold for __________ seconds. Repeat this exercise 5-10 times with each hand. Grip strengthening  Hold a stress ball or other soft ball in the middle of your hand. Slowly increase the pressure, squeezing the ball as much as you can without causing pain. Think of bringing the tips of your fingers into the middle of your palm. All of your finger joints should bend when doing this exercise. Hold your squeeze for __________ seconds, then relax. Repeat this exercise 5-10 times with each hand. Contact a health care provider if: Your hand pain or discomfort gets much worse when you do an exercise. Your hand pain or discomfort does not improve within 2 hours after you exercise. If you have any of these problems, stop doing these exercises right away. Do not do them again unless your health care provider says that you can. Get help right away if: You develop sudden, severe hand pain or swelling. If this happens, stop doing these exercises right away. Do not do them again unless your health care provider says that you can. This information is not intended to replace advice given to you by your health care provider. Make sure you discuss any questions you have with your health care provider. Document Revised: 10/25/2020 Document Reviewed: 10/25/2020 Elsevier Patient Education  2023 [employer]  

## 2022-07-25 LAB — COMPLETE METABOLIC PANEL WITH GFR
AG Ratio: 1.4 (calc) (ref 1.0–2.5)
ALT: 21 U/L (ref 6–29)
AST: 12 U/L (ref 10–35)
Albumin: 4.6 g/dL (ref 3.6–5.1)
Alkaline phosphatase (APISO): 62 U/L (ref 37–153)
BUN: 23 mg/dL (ref 7–25)
CO2: 28 mmol/L (ref 20–32)
Calcium: 9.9 mg/dL (ref 8.6–10.4)
Chloride: 102 mmol/L (ref 98–110)
Creat: 0.82 mg/dL (ref 0.60–1.00)
Globulin: 3.2 g/dL (calc) (ref 1.9–3.7)
Glucose, Bld: 93 mg/dL (ref 65–99)
Potassium: 4.1 mmol/L (ref 3.5–5.3)
Sodium: 141 mmol/L (ref 135–146)
Total Bilirubin: 0.5 mg/dL (ref 0.2–1.2)
Total Protein: 7.8 g/dL (ref 6.1–8.1)
eGFR: 76 mL/min/{1.73_m2} (ref >=60)

## 2022-07-25 LAB — CBC WITH DIFFERENTIAL/PLATELET
Absolute Monocytes: 783 cells/uL (ref 200–950)
Basophils Absolute: 68 cells/uL (ref 0–200)
Basophils Relative: 0.9 %
Eosinophils Absolute: 304 cells/uL (ref 15–500)
Eosinophils Relative: 4 %
HCT: 39.3 % (ref 35.0–45.0)
Hemoglobin: 13.3 g/dL (ref 11.7–15.5)
Lymphs Abs: 1794 cells/uL (ref 850–3900)
MCH: 28.8 pg (ref 27.0–33.0)
MCHC: 33.8 g/dL (ref 32.0–36.0)
MCV: 85.1 fL (ref 80.0–100.0)
MPV: 10 fL (ref 7.5–12.5)
Monocytes Relative: 10.3 %
Neutro Abs: 4651 cells/uL (ref 1500–7800)
Neutrophils Relative %: 61.2 %
Platelets: 321 10*3/uL (ref 140–400)
RBC: 4.62 10*6/uL (ref 3.80–5.10)
RDW: 12.4 % (ref 11.0–15.0)
Total Lymphocyte: 23.6 %
WBC: 7.6 10*3/uL (ref 3.8–10.8)

## 2022-07-25 LAB — SEDIMENTATION RATE: Sed Rate: 14 mm/h (ref 0–30)

## 2022-07-25 LAB — PROTEIN / CREATININE RATIO, URINE
Creatinine, Urine: 51 mg/dL (ref 20–275)
Protein/Creat Ratio: 78 mg/g creat (ref 24–184)
Protein/Creatinine Ratio: 0.078 mg/mg creat (ref 0.024–0.184)
Total Protein, Urine: 4 mg/dL — ABNORMAL LOW (ref 5–24)

## 2022-07-25 LAB — C3 AND C4
C3 Complement: 155 mg/dL (ref 83–193)
C4 Complement: 38 mg/dL (ref 15–57)

## 2022-07-25 LAB — CYCLIC CITRUL PEPTIDE ANTIBODY, IGG: Cyclic Citrullin Peptide Ab: <16 UNITS

## 2022-07-25 LAB — C-REACTIVE PROTEIN: CRP: 10.9 mg/L — ABNORMAL HIGH (ref <8.0)

## 2022-07-25 LAB — RHEUMATOID FACTOR: Rheumatoid fact SerPl-aCnc: <14 IU/mL (ref <14)

## 2022-07-25 LAB — ANTI-DNA ANTIBODY, DOUBLE-STRANDED: ds DNA Ab: <1 IU/mL

## 2022-07-25 NOTE — Progress Notes (Signed)
CBC and CMP WNL.  ESR WNL. Complements WNL.  CRP is elevated.  No proteinuria noted.  RF negative. dsDNA is negative.

## 2022-07-25 NOTE — Progress Notes (Signed)
Anti-CCP negative.

## 2022-07-28 DIAGNOSIS — E7800 Pure hypercholesterolemia, unspecified: Secondary | ICD-10-CM | POA: Diagnosis not present

## 2022-07-28 DIAGNOSIS — R7303 Prediabetes: Secondary | ICD-10-CM | POA: Diagnosis not present

## 2022-08-11 DIAGNOSIS — M8588 Other specified disorders of bone density and structure, other site: Secondary | ICD-10-CM | POA: Diagnosis not present

## 2022-08-11 DIAGNOSIS — Z1231 Encounter for screening mammogram for malignant neoplasm of breast: Secondary | ICD-10-CM | POA: Diagnosis not present

## 2022-08-13 ENCOUNTER — Encounter: Payer: Self-pay | Admitting: Obstetrics and Gynecology

## 2022-08-14 ENCOUNTER — Encounter: Payer: Self-pay | Admitting: Obstetrics and Gynecology

## 2022-08-29 DIAGNOSIS — M7062 Trochanteric bursitis, left hip: Secondary | ICD-10-CM | POA: Diagnosis not present

## 2022-09-03 NOTE — Progress Notes (Signed)
72 y.o. G53P1001 Widowed [place] American female here for annual exam.  Pt wants to discuss falling in HR MCR.  She is followed for HSV and osteopenia.  Has not been taking Valtrex.  She stopped Fosamax after consultation with Dr. Bennie Dallas, who follows her lupus.   Hx low vit D and followed by PCP.   PCP:   Carol Ada, MD  No LMP recorded. Patient has had a hysterectomy.           Sexually active: No.  The current method of family planning is status post hysterectomy.    Exercising: Yes.     Zumba 2-3x a week, go to [facility] 2-3x a week Smoker:  no  Health Maintenance: Pap:  09/05/21 neg, 07-04-15 Neg, 05-17-14 Neg  History of abnormal Pap:  no MMG:   08/11/22 per pt ([facility]) 07/01/21 Breast Density Category B, BI-RADS CATEGORY 1 Neg Colonoscopy:  04/23/15 BMD:   08/11/22  Result  osteopenia of spine. Off Fosamax.  Has taken it twice.  TDaP:  PCP Gardasil:   n/a HIV:neg years ago Hep C: 2017 neg  Screening Labs:  PCP   reports that she has never smoked. She has never been exposed to tobacco smoke. She has never used smokeless tobacco. She reports current alcohol use. She reports that she does not use drugs.  Past Medical History:  Diagnosis Date   HSV (herpes simplex virus) infection    Hydrosalpinx    Hypertension    IBS (irritable bowel syndrome)    Insomnia    Lupus (HCC)    sle and discoid lupus   Lupus ([place])    Osteoporosis 2019   T score -2.8 statistically improved from prior study   Prediabetes    Raynaud's disease    Spinal stenosis    per patient    Systemic lupus erythematosus ([place])     Past Surgical History:  Procedure Laterality Date   ABDOMINAL HYSTERECTOMY  1987   TAH   back injection  05/2020   FOOT SURGERY Left 2010   hysterectomy  1987   ROTATOR CUFF REPAIR  2006   SHOULDER SURGERY FOR BONE SPUR  2010   STRESS FRACTURE LEG  08/2007   TENDON RELEASE Left 01/07/2018   TUBAL LIGATION  1978   WRIST SURGERY Right 09/23/2018   tendon release    WRIST  SURGERY Left 12/2017    Current Outpatient Medications  Medication Sig Dispense Refill   amLODipine (NORVASC) 5 MG tablet Take 5 mg by mouth daily.       Ascorbic Acid (VITAMIN C CR) 1000 MG TBCR SMARTSIG:1 By Mouth     bimatoprost (LATISSE) 0.03 % ophthalmic solution Apply 1 drop to eye daily.     CALCIUM 600 1500 (600 Ca) MG TABS tablet SMARTSIG:1 By Mouth     cetirizine (ZYRTEC) 10 MG tablet Take 1 tablet by mouth as needed.     Cholecalciferol (VITAMIN D PO) Take 5,000 Units by mouth.     diclofenac sodium (VOLTAREN) 1 % GEL Apply 3 g topically 3 (three) times daily as needed.     hydroxychloroquine (PLAQUENIL) 200 MG tablet Take 1 tablet (200 mg total) by mouth every other day. 45 tablet 0   linaclotide (LINZESS) 290 MCG CAPS capsule Take by mouth daily before breakfast.      magnesium oxide (MAG-OX) 400 MG tablet Take 400 mg by mouth daily.     montelukast (SINGULAIR) 10 MG tablet Take 10 mg by mouth as needed.  Multiple Vitamin (MULTIVITAMIN) capsule Take 1 capsule by mouth daily.       Probiotic Product (PROBIOTIC-10 PO) Take 1 tablet by mouth daily.     rosuvastatin (CRESTOR) 10 MG tablet TAKE 1 TABLET BY MOUTH  DAILY 90 tablet 3   triamcinolone cream (KENALOG) 0.1 % APP AA ON THE SKIN D PRN  0   triamterene-hydrochlorothiazide (MAXZIDE-25) 37.5-25 MG tablet Take 1 tablet by mouth daily.     valACYclovir (VALTREX) 500 MG tablet Take 1 tablet (500 mg total) by mouth daily. Take daily for prevention.  Take one tablet by mouth twice a day for 3 day for an outbreak. 90 tablet 3   No current facility-administered medications for this visit.    Family History  Problem Relation Age of Onset   Cancer Mother    Diabetes Mother    Hypertension Mother    Thyroid disease Mother    Heart disease Mother    Breast cancer Mother        Age 63   Diabetes Father    Hypertension Father    Cancer Father        COLON   Heart disease Father    Crohn's disease Sister    Hypertension  Sister    Stroke Sister    Cancer Sister        Intestinal Ca   Colitis Sister    Sarcoidosis Sister    Cancer Sister    Crohn's disease Sister    Hypertension Brother    Hypertension Brother    Paget's disease of bone Brother    Hypertension Maternal Grandmother    Diabetes Maternal Grandmother    Hypertension Paternal Grandmother    Diabetes Paternal Grandmother    Diabetes Paternal Grandfather    Healthy Son    Rheum arthritis Son     Review of Systems  All other systems reviewed and are negative.   Exam:   BP 120/72 (BP Location: Right Arm, Patient Position: Sitting, Cuff Size: Normal)   Pulse 65   Ht 5' 2.5" (1.588 m)   Wt 153 lb (69.4 kg)   SpO2 98%   BMI 27.54 kg/m     General appearance: alert, cooperative and appears stated age Head: normocephalic, without obvious abnormality, atraumatic Neck: no adenopathy, supple, symmetrical, trachea midline and thyroid normal to inspection and palpation Lungs: clear to auscultation bilaterally Breasts: normal appearance, no masses or tenderness, No nipple retraction or dimpling, No nipple discharge or bleeding, No axillary adenopathy Heart: regular rate and rhythm Abdomen: soft, non-tender; no masses, no organomegaly Extremities: extremities normal, atraumatic, no cyanosis or edema Skin: skin color, texture, turgor normal. No rashes or lesions Lymph nodes: cervical, supraclavicular, and axillary nodes normal. Neurologic: grossly normal  Pelvic: External genitalia:  no lesions              No abnormal inguinal nodes palpated.              Urethra:  normal appearing urethra with no masses, tenderness or lesions              Bartholins and Skenes: normal                 Vagina: normal appearing vagina with normal color and discharge, no lesions              Cervix: absent              Pap taken: no Bimanual Exam:  Uterus:  absent  Adnexa: no mass, fullness, tenderness              Rectal exam: yes.   Confirms.              Anus:  normal sphincter tone, no lesions  Chaperone was present for exam:  Emily  Assessment:   Well woman visit with gynecologic exam. GYN exam for high risk Medicare patient.  Status post TAH.  Ovaries remain.  Hx osteoporosis.  Now with osteopenia of spine.  Off Fosamax.  Hx HSV.  Resolution of right hydrosalpinx.   Plan: Mammogram screening discussed. Self breast awareness reviewed. Pap and HR HPV as above. Guidelines for Calcium, Vitamin D, regular exercise program including cardiovascular and weight bearing exercise. Refill of Valtrex.  BMD reviewed from 2024.  Next BMD in 2026.  Follow up annually and prn.   30 min  total time was spent for this patient encounter, including preparation, face-to-face counseling with the patient, coordination of care, and documentation of the encounter in addition to doing breast and pelvic exam.   After visit summary provided.

## 2022-09-04 DIAGNOSIS — M7632 Iliotibial band syndrome, left leg: Secondary | ICD-10-CM | POA: Diagnosis not present

## 2022-09-10 DIAGNOSIS — H52203 Unspecified astigmatism, bilateral: Secondary | ICD-10-CM | POA: Diagnosis not present

## 2022-09-10 DIAGNOSIS — Z79899 Other long term (current) drug therapy: Secondary | ICD-10-CM | POA: Diagnosis not present

## 2022-09-10 DIAGNOSIS — H25813 Combined forms of age-related cataract, bilateral: Secondary | ICD-10-CM | POA: Diagnosis not present

## 2022-09-10 DIAGNOSIS — H5213 Myopia, bilateral: Secondary | ICD-10-CM | POA: Diagnosis not present

## 2022-09-11 DIAGNOSIS — M7632 Iliotibial band syndrome, left leg: Secondary | ICD-10-CM | POA: Diagnosis not present

## 2022-09-17 ENCOUNTER — Encounter: Payer: Self-pay | Admitting: Obstetrics and Gynecology

## 2022-09-17 ENCOUNTER — Ambulatory Visit (INDEPENDENT_AMBULATORY_CARE_PROVIDER_SITE_OTHER): Payer: Medicare Other | Admitting: Obstetrics and Gynecology

## 2022-09-17 VITALS — BP 120/72 | HR 65 | Ht 62.5 in | Wt 153.0 lb

## 2022-09-17 DIAGNOSIS — M8588 Other specified disorders of bone density and structure, other site: Secondary | ICD-10-CM

## 2022-09-17 DIAGNOSIS — B009 Herpesviral infection, unspecified: Secondary | ICD-10-CM | POA: Diagnosis not present

## 2022-09-17 DIAGNOSIS — Z8619 Personal history of other infectious and parasitic diseases: Secondary | ICD-10-CM

## 2022-09-17 DIAGNOSIS — Z8379 Family history of other diseases of the digestive system: Secondary | ICD-10-CM | POA: Insufficient documentation

## 2022-09-17 DIAGNOSIS — Z9189 Other specified personal risk factors, not elsewhere classified: Secondary | ICD-10-CM

## 2022-09-17 DIAGNOSIS — Z5181 Encounter for therapeutic drug level monitoring: Secondary | ICD-10-CM

## 2022-09-17 DIAGNOSIS — Z01419 Encounter for gynecological examination (general) (routine) without abnormal findings: Secondary | ICD-10-CM

## 2022-09-17 MED ORDER — VALACYCLOVIR HCL 500 MG PO TABS: 500.0000 mg | ORAL_TABLET | Freq: Every day | ORAL | 3 refills | Status: DC

## 2022-09-17 NOTE — Patient Instructions (Signed)

## 2022-09-18 DIAGNOSIS — M7632 Iliotibial band syndrome, left leg: Secondary | ICD-10-CM | POA: Diagnosis not present

## 2022-09-23 ENCOUNTER — Other Ambulatory Visit: Payer: Self-pay | Admitting: Physician Assistant

## 2022-09-23 DIAGNOSIS — M3219 Other organ or system involvement in systemic lupus erythematosus: Secondary | ICD-10-CM

## 2022-09-24 DIAGNOSIS — M7632 Iliotibial band syndrome, left leg: Secondary | ICD-10-CM | POA: Diagnosis not present

## 2022-09-26 DIAGNOSIS — M170 Bilateral primary osteoarthritis of knee: Secondary | ICD-10-CM | POA: Diagnosis not present

## 2022-09-26 DIAGNOSIS — M1712 Unilateral primary osteoarthritis, left knee: Secondary | ICD-10-CM | POA: Diagnosis not present

## 2022-09-26 DIAGNOSIS — M1711 Unilateral primary osteoarthritis, right knee: Secondary | ICD-10-CM | POA: Diagnosis not present

## 2022-09-26 DIAGNOSIS — M25552 Pain in left hip: Secondary | ICD-10-CM | POA: Diagnosis not present

## 2022-10-01 DIAGNOSIS — M7632 Iliotibial band syndrome, left leg: Secondary | ICD-10-CM | POA: Diagnosis not present

## 2022-10-08 ENCOUNTER — Other Ambulatory Visit: Payer: Self-pay | Admitting: Physician Assistant

## 2022-10-08 DIAGNOSIS — M3219 Other organ or system involvement in systemic lupus erythematosus: Secondary | ICD-10-CM

## 2022-10-08 NOTE — Telephone Encounter (Signed)
Last Fill: 07/23/2022   Eye exam: 09/10/2022 WNL   Labs: 07/23/2022 CBC and CMP WNL.   Next Visit: 12/24/2022  Last Visit: 07/23/2022  DX: Other systemic lupus erythematosus with other organ involvement   Current Dose per office note 07/23/2022: Plaquenil 200 mg 1 tablet by mouth every other day.    Okay to refill Plaquenil?

## 2022-10-13 DIAGNOSIS — E7800 Pure hypercholesterolemia, unspecified: Secondary | ICD-10-CM | POA: Diagnosis not present

## 2022-10-13 DIAGNOSIS — R7303 Prediabetes: Secondary | ICD-10-CM | POA: Diagnosis not present

## 2022-10-13 DIAGNOSIS — Z713 Dietary counseling and surveillance: Secondary | ICD-10-CM | POA: Diagnosis not present

## 2022-10-14 DIAGNOSIS — H6123 Impacted cerumen, bilateral: Secondary | ICD-10-CM | POA: Diagnosis not present

## 2022-10-15 DIAGNOSIS — M7632 Iliotibial band syndrome, left leg: Secondary | ICD-10-CM | POA: Diagnosis not present

## 2022-10-21 DIAGNOSIS — H10412 Chronic giant papillary conjunctivitis, left eye: Secondary | ICD-10-CM | POA: Diagnosis not present

## 2022-10-22 DIAGNOSIS — M7632 Iliotibial band syndrome, left leg: Secondary | ICD-10-CM | POA: Diagnosis not present

## 2022-10-24 DIAGNOSIS — M1711 Unilateral primary osteoarthritis, right knee: Secondary | ICD-10-CM | POA: Diagnosis not present

## 2022-10-27 DIAGNOSIS — M329 Systemic lupus erythematosus, unspecified: Secondary | ICD-10-CM | POA: Diagnosis not present

## 2022-10-27 DIAGNOSIS — I10 Essential (primary) hypertension: Secondary | ICD-10-CM | POA: Diagnosis not present

## 2022-10-27 DIAGNOSIS — Z789 Other specified health status: Secondary | ICD-10-CM | POA: Diagnosis not present

## 2022-10-27 DIAGNOSIS — Z1331 Encounter for screening for depression: Secondary | ICD-10-CM | POA: Diagnosis not present

## 2022-10-27 DIAGNOSIS — M8580 Other specified disorders of bone density and structure, unspecified site: Secondary | ICD-10-CM | POA: Diagnosis not present

## 2022-10-27 DIAGNOSIS — R7309 Other abnormal glucose: Secondary | ICD-10-CM | POA: Diagnosis not present

## 2022-10-27 DIAGNOSIS — E7800 Pure hypercholesterolemia, unspecified: Secondary | ICD-10-CM | POA: Diagnosis not present

## 2022-10-27 DIAGNOSIS — Z0000 Encounter for general adult medical examination without abnormal findings: Secondary | ICD-10-CM | POA: Diagnosis not present

## 2022-10-27 DIAGNOSIS — I7300 Raynaud's syndrome without gangrene: Secondary | ICD-10-CM | POA: Diagnosis not present

## 2022-10-27 DIAGNOSIS — I2510 Atherosclerotic heart disease of native coronary artery without angina pectoris: Secondary | ICD-10-CM | POA: Diagnosis not present

## 2022-11-12 DIAGNOSIS — M7632 Iliotibial band syndrome, left leg: Secondary | ICD-10-CM | POA: Diagnosis not present

## 2022-12-09 ENCOUNTER — Other Ambulatory Visit: Payer: Self-pay | Admitting: Physician Assistant

## 2022-12-09 DIAGNOSIS — M3219 Other organ or system involvement in systemic lupus erythematosus: Secondary | ICD-10-CM

## 2022-12-09 NOTE — Telephone Encounter (Signed)
Last Fill: 10/08/2022  Eye exam:  09/10/2022 WNL   Labs: 07/23/2022 CBC and CMP WNL.   Next Visit: 12/24/2022  Last Visit: 07/23/2022  DX: Other systemic lupus erythematosus with other organ involvement   Current Dose per office note 07/23/2022: Plaquenil 200 mg 1 tablet by mouth every other day.   Okay to refill Plaquenil?

## 2022-12-10 NOTE — Progress Notes (Signed)
Office Visit Note  Patient: Theresa Graham             Date of Birth: May 01, 1951           MRN: [id number]             PCP: Merri Brunette, MD Referring: Merri Brunette, MD Visit Date: 12/24/2022 Occupation: @GUAROCC @  Subjective:  Joint stiffness and sinus infection   History of Present Illness: Theresa Graham is a 72 y.o. female with history of systemic lupus  and osteoarthritis.  Patient states she recently gave informed medication and developed upper respiratory tract infection.  She was diagnosed with sinusitis today at the urgent care.  She states that she was prescribed antibiotics which she has not started yet.  She continues to have some stiffness in her hands and her knee joints.  She has not noticed any joint swelling.  She was recently seen by an orthopedic surgeon and was diagnosed with left trochanteric bursitis and left knee joint arthritis behind her kneecap.  She was given a left trochanteric bursa injection.  She denies any history of oral ulcers, nasal ulcers, malar rash, Raynaud's, inflammatory arthritis or lymphadenopathy.  She continues to have some photosensitivity.    Activities of Daily Living:  Patient reports morning stiffness for 5-10 minutes.   Patient Denies nocturnal pain.  Difficulty dressing/grooming: Denies Difficulty climbing stairs: Denies Difficulty getting out of chair: Denies Difficulty using hands for taps, buttons, cutlery, and/or writing: Reports  Review of Systems  Constitutional:  Positive for fatigue.  HENT:  Negative for mouth sores and mouth dryness.   Eyes:  Negative for dryness.  Respiratory:  Negative for shortness of breath.   Cardiovascular:  Negative for chest pain and palpitations.  Gastrointestinal:  Positive for constipation. Negative for blood in stool and diarrhea.  Endocrine: Negative for increased urination.  Genitourinary:  Negative for involuntary urination.  Musculoskeletal:  Positive for joint pain, joint pain and  morning stiffness. Negative for gait problem, joint swelling, myalgias, muscle weakness, muscle tenderness and myalgias.  Skin:  Positive for sensitivity to sunlight. Negative for color change, rash and hair loss.  Allergic/Immunologic: Negative for susceptible to infections.  Neurological:  Negative for dizziness and headaches.  Hematological:  Negative for swollen glands.  Psychiatric/Behavioral:  Positive for sleep disturbance. Negative for depressed mood. The patient is not nervous/anxious.     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   Age-related osteoporosis without current pathological fracture 08/04/2016   Systemic lupus erythematosus (HCC) 08/01/2016   Vitamin D deficiency 08/01/2016   History of IBS 08/01/2016   Plantar fasciitis 08/01/2016   Raynaud's disease without gangrene 08/01/2016   History of hypertension 08/01/2016   High risk medication use 06/18/2016   Hypertension    HSV (herpes simplex virus) infection    Headache(784.0)     Past Medical History:  Diagnosis Date   HSV (herpes simplex virus) infection    Hydrosalpinx    Hypertension    IBS (irritable bowel syndrome)    Insomnia    Lupus (HCC)    sle and discoid lupus   Lupus (HCC)  Osteoporosis 2019   T score -2.8 statistically improved from prior study   Prediabetes    Raynaud's disease    Spinal stenosis    per patient    Systemic lupus erythematosus (HCC)     Family History  Problem Relation Age of Onset   Cancer Mother    Diabetes Mother    Hypertension Mother    Thyroid disease Mother    Heart disease  Mother    Breast cancer Mother        Age 39   Diabetes Father    Hypertension Father    Cancer Father        COLON   Heart disease Father    Crohn's disease Sister    Hypertension Sister    Stroke Sister    Cancer Sister        Intestinal Ca   Colitis Sister    Sarcoidosis Sister    Cancer Sister    Crohn's disease Sister    Hypertension Brother    Hypertension Brother    Paget's disease of bone Brother    Hypertension Maternal Grandmother    Diabetes Maternal Grandmother    Hypertension Paternal Grandmother    Diabetes Paternal Grandmother    Diabetes Paternal Grandfather    Healthy Son    Rheum arthritis Son    Past Surgical History:  Procedure Laterality Date   ABDOMINAL HYSTERECTOMY  1987   TAH   back injection  05/2020   FOOT SURGERY Left 2010   hysterectomy  1987   ROTATOR CUFF REPAIR  2006   SHOULDER SURGERY FOR BONE SPUR  2010   STRESS FRACTURE LEG  08/2007   TENDON RELEASE Left 01/07/2018   TUBAL LIGATION  1978   WRIST SURGERY Right 09/23/2018   tendon release    WRIST SURGERY Left 12/2017   Social History   Social History Narrative   Not on file   Immunization History  Administered Date(s) Administered   Influenza Split 05/05/2012   Influenza, High Dose Seasonal PF 04/25/2017, 04/19/2018, 03/09/2019   Influenza,inj,Quad PF,6+ Mos 05/13/2013, 05/17/2014   Influenza-Unspecified 04/25/2017   PFIZER(Purple Top)SARS-COV-2 Vaccination 08/16/2019, 09/06/2019, 03/23/2020, 10/22/2020   Zoster Recombinat (Shingrix) 05/27/2017     Objective: Vital Signs: BP 133/74 (BP Location: Left Arm, Patient Position: Sitting, Cuff Size: Normal)   Pulse 61   Resp 12   Ht 5' 4.5" (1.638 m)   Wt 145 lb (65.8 kg)   BMI 24.50 kg/m    Physical Exam Vitals and nursing note reviewed.  Constitutional:      Appearance: She is well-developed.  HENT:     Head: Normocephalic and atraumatic.  Eyes:     Conjunctiva/sclera: Conjunctivae normal.  Cardiovascular:      Rate and Rhythm: Normal rate and regular rhythm.     Heart sounds: Normal heart sounds.  Pulmonary:     Effort: Pulmonary effort is normal.     Breath sounds: Normal breath sounds.  Abdominal:     General: Bowel sounds are normal.     Palpations: Abdomen is soft.  Musculoskeletal:     Cervical back: Normal range of motion.  Lymphadenopathy:     Cervical: No cervical adenopathy.  Skin:    General: Skin is warm and dry.     Capillary Refill: Capillary refill takes less than 2 seconds.  Neurological:     Mental Status: She is alert and oriented to person, place, and time.  Psychiatric:  Behavior: Behavior normal.      Musculoskeletal Exam: Cervical, thoracic and lumbar spine were in good range of motion.  Shoulder joints, elbow joints, wrist joints, MCPs PIPs and DIPs were in good range of motion.  She had bilateral second and third MCP thickening with no synovitis.  Hip joints and knee joints were in good range of motion.  No warmth swelling or effusion was noted.  CDAI Exam: CDAI Score: -- Patient Global: --; Provider Global: -- Swollen: --; Tender: -- Joint Exam 12/24/2022   No joint exam has been documented for this visit   There is currently no information documented on the homunculus. Go to the Rheumatology activity and complete the homunculus joint exam.  Investigation: No additional findings.  Imaging: No results found.  Recent Labs: Lab Results  Component Value Date   WBC 7.6 07/23/2022   HGB 13.3 07/23/2022   PLT 321 07/23/2022   NA 141 07/23/2022   K 4.1 07/23/2022   CL 102 07/23/2022   CO2 28 07/23/2022   GLUCOSE 93 07/23/2022   BUN 23 07/23/2022   CREATININE 0.82 07/23/2022   BILITOT 0.5 07/23/2022   ALKPHOS 69 08/04/2016   AST 12 07/23/2022   ALT 21 07/23/2022   PROT 7.8 07/23/2022   ALBUMIN 4.3 08/04/2016   CALCIUM 9.9 07/23/2022   GFRAA 70 05/31/2020    Speciality Comments: PLQ Eye Exam: 09/10/2022 WNL @ [facility] Ophthalmology follow  up in 1 year Fosamax 07/2016  Procedures:  No procedures performed Allergies: Latex   Assessment / Plan:     Visit Diagnoses: Other systemic lupus erythematosus with other organ involvement (HCC) - Positive ANA, positive Smith, positive RNP, positive Ro, positive Raynauds, positive photosensitivity, positive arthritis, DLE: -Patient denies any history of lupus flare.  She continues to have photosensitivity and using sunscreen.  She denies any history of oral ulcers, nasal ulcers, malar rash, Raynaud's, inflammatory arthritis or lymphadenopathy.  She has synovial thickening over MCPs but no synovitis was noted.  Labs obtained on July 23, 2022 were reviewed.  Double-stranded ENA was negative and complements were normal.  Sed rate was normal.  I advised her to return in couple of weeks for labs that she is currently having a sinus infection.  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  No active disease was noted today.  High risk medication use - Plaquenil 200 mg 1 tablet by mouth every other day. PLQ Eye Exam: 09/10/2022 -CBC and CMP were normal on July 23, 2022.  We will have labs in couple of weeks and then at the follow-up visit.  Plan: CBC with Differential/Platelet, COMPLETE METABOLIC PANEL WITH GFR.  Information on immunization was placed in the years.    Stiffness of joints of both hands-with no active synovitis was noted.  Reduction.  Raynaud's syndrome without gangrene-she is symptomatic during the winter months.  No sclerodactyly or nailbed capillary changes were noted.  Primary osteoarthritis of both knees-she is followed by orthopedics.  Patient states she was recently diagnosed with the patellofemoral arthritis.  DDD (degenerative disc disease), lumbar-she has intermittent discomfort in the lower back.  Age-related osteoporosis without current pathological fracture - DEXA  on 08/01/20: revealed an improvement in the spine from -2.8 to -1.6.  DEXA scan on August 11, 2022 showed a T-score of -1.8 and BMD 0.851 and AP spine with -3% change.  Left total femur was -7% and right total femur 0%  change.  DEXA scan results were discussed with the patient.  Calcium rich diet and vitamin D was advised.  Need for regular exercise was advised.  Vitamin D deficiency-patient had vitamin D deficiency in the past.  Vitamin D was 45 on February 2023.  Other medical problems are listed as follows:  History of IBS  History of hypertension-blood pressure was normal at 133/74 today.  Acute non-recurrent sinusitis of other sinus-patient was scribed antibiotics for sinus infection today.  Orders: Orders Placed This Encounter  Procedures   Protein / creatinine ratio, urine   CBC with Differential/Platelet   COMPLETE METABOLIC PANEL WITH GFR   Anti-DNA antibody, double-stranded   C3 and C4   Sedimentation rate   No orders of the defined types were placed in this encounter.    Follow-Up Instructions: Return in about 5 months (around 05/26/2023) for Systemic lupus.   Pollyann Savoy, MD  Note - This record has been created using Animal nutritionist.  Chart creation errors have been sought, but may not always  have been located. Such creation errors do not reflect on  the standard of medical care.

## 2022-12-24 ENCOUNTER — Ambulatory Visit: Payer: 59 | Attending: Rheumatology | Admitting: Rheumatology

## 2022-12-24 ENCOUNTER — Encounter: Payer: Self-pay | Admitting: Rheumatology

## 2022-12-24 VITALS — BP 133/74 | HR 61 | Resp 12 | Ht 64.5 in | Wt 145.0 lb

## 2022-12-24 DIAGNOSIS — I7300 Raynaud's syndrome without gangrene: Secondary | ICD-10-CM

## 2022-12-24 DIAGNOSIS — J018 Other acute sinusitis: Secondary | ICD-10-CM

## 2022-12-24 DIAGNOSIS — M5136 Other intervertebral disc degeneration, lumbar region: Secondary | ICD-10-CM | POA: Diagnosis not present

## 2022-12-24 DIAGNOSIS — M3219 Other organ or system involvement in systemic lupus erythematosus: Secondary | ICD-10-CM | POA: Diagnosis not present

## 2022-12-24 DIAGNOSIS — R059 Cough, unspecified: Secondary | ICD-10-CM | POA: Diagnosis not present

## 2022-12-24 DIAGNOSIS — M25641 Stiffness of right hand, not elsewhere classified: Secondary | ICD-10-CM

## 2022-12-24 DIAGNOSIS — M170 Bilateral primary osteoarthritis of knee: Secondary | ICD-10-CM

## 2022-12-24 DIAGNOSIS — R0981 Nasal congestion: Secondary | ICD-10-CM | POA: Diagnosis not present

## 2022-12-24 DIAGNOSIS — M810 Age-related osteoporosis without current pathological fracture: Secondary | ICD-10-CM

## 2022-12-24 DIAGNOSIS — U071 COVID-19: Secondary | ICD-10-CM | POA: Diagnosis not present

## 2022-12-24 DIAGNOSIS — M25642 Stiffness of left hand, not elsewhere classified: Secondary | ICD-10-CM | POA: Diagnosis not present

## 2022-12-24 DIAGNOSIS — R252 Cramp and spasm: Secondary | ICD-10-CM

## 2022-12-24 DIAGNOSIS — Z8679 Personal history of other diseases of the circulatory system: Secondary | ICD-10-CM

## 2022-12-24 DIAGNOSIS — Z8719 Personal history of other diseases of the digestive system: Secondary | ICD-10-CM

## 2022-12-24 DIAGNOSIS — Z79899 Other long term (current) drug therapy: Secondary | ICD-10-CM

## 2022-12-24 DIAGNOSIS — E559 Vitamin D deficiency, unspecified: Secondary | ICD-10-CM

## 2022-12-24 DIAGNOSIS — L930 Discoid lupus erythematosus: Secondary | ICD-10-CM

## 2022-12-24 NOTE — Patient Instructions (Addendum)
Standing Labs We placed an order today for your standing lab work.   Please have your standing labs drawn in June and November  Please have your labs drawn 2 weeks prior to your appointment so that the provider can discuss your lab results at your appointment, if possible.  Please note that you may see your imaging and lab results in MyChart before we have reviewed them. We will contact you once all results are reviewed. Please allow our office up to 72 hours to thoroughly review all of the results before contacting the office for clarification of your results.  WALK-IN LAB HOURS  Monday through Thursday from 8:00 am -12:30 pm and 1:00 pm-5:00 pm and Friday from 8:00 am-12:00 pm.  Patients with office visits requiring labs will be seen before walk-in labs.  You may encounter longer than normal wait times. Please allow additional time. Wait times may be shorter on  Monday and Thursday afternoons.  We do not book appointments for walk-in labs. We appreciate your patience and understanding with our staff.   Labs are drawn by [facility]. Please bring your co-pay at the time of your lab draw.  You may receive a bill from [facility] for your lab work.  Please note if you are on Hydroxychloroquine and and an order has been placed for a Hydroxychloroquine level,  you will need to have it drawn 4 hours or more after your last dose.  If you wish to have your labs drawn at another location, please call the office 24 hours in advance so we can fax the orders.  The office is located at [address], [place], [place] [address]   If you have any questions regarding directions or hours of operation,  please call [phone].   As a reminder, please drink plenty of water prior to coming for your lab work. Thanks!   Vaccines You are taking a medication(s) that can suppress your immune system.  The following immunizations are recommended: Flu annually Covid-19  Td/Tdap (tetanus, diphtheria,  pertussis) every 10 years Pneumonia (Prevnar 15 then Pneumovax 23 at least 1 year apart.  Alternatively, can take Prevnar 20 without needing additional dose) Shingrix: 2 doses from 4 weeks to 6 months apart  Please check with your PCP to make sure you are up to date.

## 2022-12-28 ENCOUNTER — Other Ambulatory Visit: Payer: Self-pay | Admitting: Cardiology

## 2023-01-09 DIAGNOSIS — E7800 Pure hypercholesterolemia, unspecified: Secondary | ICD-10-CM | POA: Diagnosis not present

## 2023-01-09 DIAGNOSIS — R7303 Prediabetes: Secondary | ICD-10-CM | POA: Diagnosis not present

## 2023-01-09 DIAGNOSIS — Z713 Dietary counseling and surveillance: Secondary | ICD-10-CM | POA: Diagnosis not present

## 2023-01-09 DIAGNOSIS — I10 Essential (primary) hypertension: Secondary | ICD-10-CM | POA: Diagnosis not present

## 2023-01-13 ENCOUNTER — Emergency Department (HOSPITAL_COMMUNITY): Payer: Medicare Other

## 2023-01-13 ENCOUNTER — Emergency Department (HOSPITAL_COMMUNITY)
Admission: EM | Admit: 2023-01-13 | Discharge: 2023-01-13 | Disposition: A | Discharge status: Home / Self Care | Payer: Medicare Other | Attending: Emergency Medicine | Admitting: Emergency Medicine

## 2023-01-13 ENCOUNTER — Other Ambulatory Visit: Payer: Self-pay

## 2023-01-13 ENCOUNTER — Encounter (HOSPITAL_COMMUNITY): Payer: Self-pay

## 2023-01-13 DIAGNOSIS — S42212A Unspecified displaced fracture of surgical neck of left humerus, initial encounter for closed fracture: Secondary | ICD-10-CM | POA: Insufficient documentation

## 2023-01-13 DIAGNOSIS — Y9259 Other trade areas as the place of occurrence of the external cause: Secondary | ICD-10-CM | POA: Insufficient documentation

## 2023-01-13 DIAGNOSIS — M25512 Pain in left shoulder: Secondary | ICD-10-CM | POA: Diagnosis present

## 2023-01-13 DIAGNOSIS — M19012 Primary osteoarthritis, left shoulder: Secondary | ICD-10-CM | POA: Diagnosis not present

## 2023-01-13 DIAGNOSIS — W19XXXA Unspecified fall, initial encounter: Secondary | ICD-10-CM | POA: Diagnosis not present

## 2023-01-13 DIAGNOSIS — Z91040 Latex allergy status: Secondary | ICD-10-CM | POA: Insufficient documentation

## 2023-01-13 DIAGNOSIS — S42292A Other displaced fracture of upper end of left humerus, initial encounter for closed fracture: Secondary | ICD-10-CM | POA: Diagnosis not present

## 2023-01-13 DIAGNOSIS — I10 Essential (primary) hypertension: Secondary | ICD-10-CM | POA: Insufficient documentation

## 2023-01-13 DIAGNOSIS — W0110XA Fall on same level from slipping, tripping and stumbling with subsequent striking against unspecified object, initial encounter: Secondary | ICD-10-CM | POA: Insufficient documentation

## 2023-01-13 DIAGNOSIS — S0081XA Abrasion of other part of head, initial encounter: Secondary | ICD-10-CM | POA: Diagnosis not present

## 2023-01-13 DIAGNOSIS — R220 Localized swelling, mass and lump, head: Secondary | ICD-10-CM | POA: Diagnosis not present

## 2023-01-13 DIAGNOSIS — S4982XA Other specified injuries of left shoulder and upper arm, initial encounter: Secondary | ICD-10-CM | POA: Diagnosis not present

## 2023-01-13 DIAGNOSIS — S42352A Displaced comminuted fracture of shaft of humerus, left arm, initial encounter for closed fracture: Secondary | ICD-10-CM | POA: Diagnosis not present

## 2023-01-13 DIAGNOSIS — Z79899 Other long term (current) drug therapy: Secondary | ICD-10-CM | POA: Diagnosis not present

## 2023-01-13 DIAGNOSIS — Y9301 Activity, walking, marching and hiking: Secondary | ICD-10-CM | POA: Insufficient documentation

## 2023-01-13 DIAGNOSIS — J3489 Other specified disorders of nose and nasal sinuses: Secondary | ICD-10-CM | POA: Insufficient documentation

## 2023-01-13 LAB — CBG MONITORING, ED: Glucose-Capillary: 136 mg/dL — ABNORMAL HIGH (ref 70–99)

## 2023-01-13 MED ORDER — MORPHINE SULFATE (PF) 4 MG/ML IV SOLN
4.0000 mg | Freq: Once | INTRAVENOUS | Status: AC
  Administered 2023-01-13: 4 mg via INTRAMUSCULAR
  Filled 2023-01-13: qty 1

## 2023-01-13 MED ORDER — ONDANSETRON 4 MG PO TBDP: 4.0000 mg | ORAL_TABLET | Freq: Three times a day (TID) | ORAL | 0 refills | Status: DC | PRN

## 2023-01-13 MED ORDER — ACETAMINOPHEN 325 MG PO TABS
650.0000 mg | ORAL_TABLET | Freq: Once | ORAL | Status: AC
  Administered 2023-01-13: 650 mg via ORAL
  Filled 2023-01-13: qty 2

## 2023-01-13 MED ORDER — ONDANSETRON 4 MG PO TBDP
4.0000 mg | ORAL_TABLET | Freq: Once | ORAL | Status: AC
  Administered 2023-01-13: 4 mg via ORAL
  Filled 2023-01-13: qty 1

## 2023-01-13 MED ORDER — HYDROCODONE-ACETAMINOPHEN 5-325 MG PO TABS
1.0000 | ORAL_TABLET | Freq: Once | ORAL | Status: AC
  Administered 2023-01-13: 1 via ORAL
  Filled 2023-01-13: qty 1

## 2023-01-13 MED ORDER — OXYCODONE HCL 5 MG PO TABS: 5.0000 mg | ORAL_TABLET | ORAL | 0 refills | Status: DC | PRN

## 2023-01-13 MED ORDER — MORPHINE SULFATE (PF) 4 MG/ML IV SOLN: 4.0000 mg | Freq: Once | INTRAVENOUS | Status: DC

## 2023-01-13 NOTE — Progress Notes (Signed)
Orthopedic Tech Progress Note Patient Details:  Theresa Graham [birth year] [id number]  Ortho Devices Type of Ortho Device: Shoulder immobilizer Ortho Device/Splint Location: left Ortho Device/Splint Interventions: Ordered, Application, Adjustment   Post Interventions Patient Tolerated: Well Instructions Provided: Adjustment of device, Care of device  [name] 01/13/2023, 5:41 PM

## 2023-01-13 NOTE — ED Notes (Signed)
Family member came to desk and advised the patient was able to stand up ok, and ate a little. Patient feels good, and was wondering about discharge.

## 2023-01-13 NOTE — ED Notes (Signed)
Discharge papers reviewed with pt, pt left ED via wheelchair.

## 2023-01-13 NOTE — Discharge Instructions (Addendum)
You fractured your left upper humerus. Please call tomorrow your orthopedic provider, Dr. Jerl Santos, tomorrow morning to schedule follow up.  You do not have any breaks to any of the bony structures of your face or head. No signs of brain injury. You can apply ice to your check for swelling as needed.  Please return to ED if: your pain worsens, you develop numbness or tingling in your arm, or you develop a persistent and painful headache.

## 2023-01-13 NOTE — ED Notes (Signed)
Pt ambulatory without assistance.  

## 2023-01-13 NOTE — ED Provider Notes (Signed)
[place] EMERGENCY DEPARTMENT AT [facility] Provider Note   CSN: [id number] Arrival date & time: 01/13/23  1439     History {Add pertinent medical, surgical, social history, OB history to HPI:1} Chief Complaint  Patient presents with   Theresa Graham is a 72 y.o. female with a history of lupus and hypertension who presents to the ED today via EMA after a fall. Patient reports [place] and tripped over carpet and fell. She fell on the left side of her face. Patient believes she hit her left shoulder and right knee as well. She denies loss of consciousness, headache, or vomiting. Patient was not able to get up of the ground because of pain in her left shoulder. No weakness or dizziness prior to the incident. No other complaints or concerns at this time.    Home Medications Prior to Admission medications   Medication Sig Start Date End Date Taking? Authorizing Provider  amLODipine (NORVASC) 5 MG tablet Take 5 mg by mouth daily.      [provider]  amoxicillin-clavulanate (AUGMENTIN) 875-125 MG tablet 1 tablet Orally every 12 hrs for 10 day(s) 12/24/22   [provider]  Ascorbic Acid (VITAMIN C CR) 1000 MG TBCR SMARTSIG:1 By Mouth 04/17/21   [provider]  bimatoprost (LATISSE) 0.03 % ophthalmic solution Apply 1 drop to eye daily. 05/30/21   [provider]  CALCIUM 600 1500 (600 Ca) MG TABS tablet SMARTSIG:1 By Mouth 04/17/21   [provider]  cetirizine (ZYRTEC) 10 MG tablet Take 1 tablet by mouth as needed.    [provider]  Cholecalciferol (VITAMIN D PO) Take 5,000 Units by mouth.    [provider]  diclofenac sodium (VOLTAREN) 1 % GEL Apply 3 g topically 3 (three) times daily as needed.    [provider]  hydroxychloroquine (PLAQUENIL) 200 MG tablet TAKE 1 TABLET BY MOUTH EVERY  OTHER DAY 12/09/22   Pollyann Savoy, MD  linaclotide (LINZESS) 290 MCG CAPS capsule Take  by mouth daily before breakfast.     [provider]  magnesium oxide (MAG-OX) 400 MG tablet Take 400 mg by mouth daily.    [provider]  montelukast (SINGULAIR) 10 MG tablet Take 10 mg by mouth as needed.    [provider]  Multiple Vitamin (MULTIVITAMIN) capsule Take 1 capsule by mouth daily.      [provider]  Probiotic Product (PROBIOTIC-10 PO) Take 1 tablet by mouth daily.    [provider]  rosuvastatin (CRESTOR) 10 MG tablet TAKE 1 TABLET BY MOUTH DAILY 12/29/22   Jake Bathe, MD  rosuvastatin (CRESTOR) 5 MG tablet 1 tablet Orally Once a day    [provider]  triamcinolone cream (KENALOG) 0.1 % APP AA ON THE SKIN D PRN 10/07/16   [provider]  triamterene-hydrochlorothiazide (MAXZIDE-25) 37.5-25 MG tablet Take 1 tablet by mouth daily.    [provider]  valACYclovir (VALTREX) 500 MG tablet Take 1 tablet (500 mg total) by mouth daily. Take daily for prevention.  Take one tablet by mouth twice a day for 3 day for an outbreak. 09/17/22   Patton Salles, MD      Allergies    Latex    Review of Systems   Review of Systems  HENT:  Positive for facial swelling.   Musculoskeletal:        Left shoulder pain  All  other systems reviewed and are negative.   Physical Exam Updated Vital Signs BP (!) 144/84 (BP Location: Right Arm)   Pulse 61   Temp 98.1 F (36.7 C) (Oral)   Resp 14   Ht 5' 4.5" (1.638 m)   Wt 66 kg   SpO2 100%   BMI 24.59 kg/m  Physical Exam Vitals and nursing note reviewed.  Constitutional:      Appearance: Normal appearance.  HENT:     Head: Normocephalic.     Comments: Erythema, edema, and tenderness to the left zygomatic region. Small abrasion present beneath lateral aspect of left eye    Mouth/Throat:     Mouth: Mucous membranes are moist.  Eyes:     Extraocular Movements: Extraocular movements intact.     Conjunctiva/sclera: Conjunctivae normal.     Pupils:  Pupils are equal, round, and reactive to light.     Comments: No pain with EOM  Cardiovascular:     Rate and Rhythm: Normal rate and regular rhythm.     Pulses: Normal pulses.     Heart sounds: Normal heart sounds.  Pulmonary:     Effort: Pulmonary effort is normal.     Breath sounds: Normal breath sounds.  Abdominal:     Palpations: Abdomen is soft.     Tenderness: There is no abdominal tenderness.  Musculoskeletal:        General: Swelling and tenderness present.     Cervical back: Normal range of motion. No rigidity or tenderness.     Comments: Tenderness and slight swelling to left shoulder. Decreased range of motion of left shoulder secondary to pain. Tenderness without swelling to right knee with full range of motion appreciated on exam.  Skin:    General: Skin is warm and dry.     Capillary Refill: Capillary refill takes less than 2 seconds.     Findings: No rash.  Neurological:     General: No focal deficit present.     Mental Status: She is alert.     Sensory: No sensory deficit.     Motor: No weakness.  Psychiatric:        Mood and Affect: Mood normal.        Behavior: Behavior normal.     ED Results / Procedures / Treatments   Labs (all labs ordered are listed, but only abnormal results are displayed) Labs Reviewed - No data to display  EKG None  Radiology DG Shoulder Left Portable  Result Date: 01/13/2023 CLINICAL DATA:  Trauma, fall EXAM: LEFT SHOULDER COMPARISON:  None Available. FINDINGS: There is acute comminuted fracture in neck of proximal humerus. There is 9 mm offset in alignment of lateral cortical margins of the fracture. Evaluation for dislocation is limited without optimal views. As far as seen, there is no definite dislocation. Degenerative changes are noted in left AC joint. IMPRESSION: Recent comminuted displaced fracture is seen in the neck of the proximal left humerus. Electronically Signed   By: Ernie Avena M.D.   On: 01/13/2023 17:06    CT Head Wo Contrast  Result Date: 01/13/2023 CLINICAL DATA:  Fall EXAM: CT HEAD WITHOUT CONTRAST CT MAXILLOFACIAL WITHOUT CONTRAST TECHNIQUE: Multidetector CT imaging of the head and maxillofacial structures were performed using the standard protocol without intravenous contrast. Multiplanar CT image reconstructions of the maxillofacial structures were also generated. RADIATION DOSE REDUCTION: This exam was performed according to the departmental dose-optimization program which includes automated exposure control, adjustment of the mA and/or kV according to patient  size and/or use of iterative reconstruction technique. COMPARISON:  CT head 06/08/2004 FINDINGS: CT HEAD FINDINGS Brain: There is no acute intracranial hemorrhage, extra-axial fluid collection, or acute infarct. Parenchymal volume is normal for age. The ventricles are normal in size. Gray-white differentiation is preserved. The pituitary and suprasellar region are normal. There is no mass lesion. There is no mass effect or midline shift. Vascular: No hyperdense vessel or unexpected calcification. Skull: Normal. Negative for fracture or focal lesion. Other: The mastoid air cells and middle ear cavities are clear. CT MAXILLOFACIAL FINDINGS Osseous: There is no acute facial bone fracture. There is no evidence of mandibular dislocation. There is no suspicious osseous lesion. Orbits: The globes are intact.  There is no retrobulbar hematoma. Sinuses: There is mucosal thickening with layering fluid in the right sphenoid sinus. Soft tissues: There is mild swelling in the left facial soft tissues. There is no organized hematoma. IMPRESSION: 1. No acute intracranial pathology. 2. No acute facial bone fracture. 3. Mild swelling in the left facial soft tissues without organized hematoma. 4. Mucosal thickening with layering fluid in the right sphenoid sinus which can be seen with acute sinusitis in the correct clinical setting. Electronically Signed   By: Lesia Hausen M.D.   On: 01/13/2023 16:23   CT Maxillofacial Wo Contrast  Result Date: 01/13/2023 CLINICAL DATA:  Fall EXAM: CT HEAD WITHOUT CONTRAST CT MAXILLOFACIAL WITHOUT CONTRAST TECHNIQUE: Multidetector CT imaging of the head and maxillofacial structures were performed using the standard protocol without intravenous contrast. Multiplanar CT image reconstructions of the maxillofacial structures were also generated. RADIATION DOSE REDUCTION: This exam was performed according to the departmental dose-optimization program which includes automated exposure control, adjustment of the mA and/or kV according to patient size and/or use of iterative reconstruction technique. COMPARISON:  CT head 06/08/2004 FINDINGS: CT HEAD FINDINGS Brain: There is no acute intracranial hemorrhage, extra-axial fluid collection, or acute infarct. Parenchymal volume is normal for age. The ventricles are normal in size. Gray-white differentiation is preserved. The pituitary and suprasellar region are normal. There is no mass lesion. There is no mass effect or midline shift. Vascular: No hyperdense vessel or unexpected calcification. Skull: Normal. Negative for fracture or focal lesion. Other: The mastoid air cells and middle ear cavities are clear. CT MAXILLOFACIAL FINDINGS Osseous: There is no acute facial bone fracture. There is no evidence of mandibular dislocation. There is no suspicious osseous lesion. Orbits: The globes are intact.  There is no retrobulbar hematoma. Sinuses: There is mucosal thickening with layering fluid in the right sphenoid sinus. Soft tissues: There is mild swelling in the left facial soft tissues. There is no organized hematoma. IMPRESSION: 1. No acute intracranial pathology. 2. No acute facial bone fracture. 3. Mild swelling in the left facial soft tissues without organized hematoma. 4. Mucosal thickening with layering fluid in the right sphenoid sinus which can be seen with acute sinusitis in the correct clinical  setting. Electronically Signed   By: Lesia Hausen M.D.   On: 01/13/2023 16:23    Procedures Procedures: not indicated. {Document cardiac monitor, telemetry assessment procedure when appropriate:1}  Medications Ordered in ED Medications  HYDROcodone-acetaminophen (NORCO/VICODIN) 5-325 MG per tablet 1 tablet (1 tablet Oral Given 01/13/23 1618)    ED Course/ Medical Decision Making/ A&P   {   Click here for ABCD2, HEART and other calculatorsREFRESH Note before signing :1}  Medical Decision Making Amount and/or Complexity of Data Reviewed Radiology: ordered.   This patient presents to the ED for concern of fall, this involves an extensive number of treatment options, and is a complaint that carries with it a high risk of complications and morbidity.   Differential diagnosis includes: ICH, concussion, zygomatic fracture, left humerus fracture vs dislocation, etc.   Co morbidities that complicate the patient evaluation  SLE Hypertension   Additional history obtained:  Additional history obtained from patient's records.   Imaging Studies ordered:  I ordered imaging studies including CT head and maxillofacial, which showed: 1. No acute intracranial pathology.  2. No acute facial bone fracture.  3. Mild swelling in the left facial soft tissues without organized hematoma.  4. Mucosal thickening with layering fluid in the right sphenoid  sinus which can be seen with acute sinusitis in the correct clinical  setting.  Left shoulder x-ray ordered and reviewed, showing: comminuted displaced left proximal humerus neck fracture. I agree with the radiologist interpretation   Problem List / ED Course / Critical interventions / Medication management  Left humerus fracture, fall I ordered medications including: Vicodin for pain - patient denies pain relief with this medication. Morphine IM given prior to discharge. I have reviewed the patients home medicines  and have made adjustments as needed   Social Determinants of Health:  Social connection Physical activity   Test / Admission - Considered:  Patient is stable   {Document critical care time when appropriate:1} {Document review of labs and clinical decision tools ie heart score, Chads2Vasc2 etc:1}  {Document your independent review of radiology images, and any outside records:1} {Document your discussion with family members, caretakers, and with consultants:1} {Document social determinants of health affecting pt's care:1} {Document your decision making why or why not admission, treatments were needed:1} Final Clinical Impression(s) / ED Diagnoses Final diagnoses:  Closed displaced fracture of surgical neck of left humerus, unspecified fracture morphology, initial encounter  Fall, initial encounter    Rx / DC Orders ED Discharge Orders     None

## 2023-01-13 NOTE — ED Triage Notes (Addendum)
[place] [facility] [place]. Was [place], tripped on carpet and landed on her left shoulder. Has some carpet burn to the left side of her face. Complaining of right knee pain also. Not on a blood thinner.

## 2023-01-14 DIAGNOSIS — S42292A Other displaced fracture of upper end of left humerus, initial encounter for closed fracture: Secondary | ICD-10-CM | POA: Diagnosis not present

## 2023-01-23 ENCOUNTER — Encounter: Payer: Self-pay | Admitting: Cardiology

## 2023-01-23 ENCOUNTER — Ambulatory Visit: Payer: Medicare Other | Attending: Cardiology | Admitting: Cardiology

## 2023-01-23 VITALS — BP 116/68 | HR 66 | Ht 64.5 in | Wt 151.0 lb

## 2023-01-23 DIAGNOSIS — I7300 Raynaud's syndrome without gangrene: Secondary | ICD-10-CM | POA: Diagnosis not present

## 2023-01-23 DIAGNOSIS — I10 Essential (primary) hypertension: Secondary | ICD-10-CM

## 2023-01-23 DIAGNOSIS — I7090 Unspecified atherosclerosis: Secondary | ICD-10-CM | POA: Diagnosis not present

## 2023-01-23 DIAGNOSIS — M3219 Other organ or system involvement in systemic lupus erythematosus: Secondary | ICD-10-CM | POA: Insufficient documentation

## 2023-01-23 DIAGNOSIS — I2510 Atherosclerotic heart disease of native coronary artery without angina pectoris: Secondary | ICD-10-CM | POA: Diagnosis not present

## 2023-01-23 DIAGNOSIS — I2584 Coronary atherosclerosis due to calcified coronary lesion: Secondary | ICD-10-CM

## 2023-01-23 NOTE — Progress Notes (Signed)
  Cardiology Office Note:  .   Date:  01/23/2023  ID:  Theresa Graham, DOB 12-01-1950, MRN [id number] PCP: Merri Brunette, MD  [facility] Providers Cardiologist:  Donato Schultz, MD    History of Present Illness: .   Theresa Graham is a 72 y.o. female here for follow-up CAD family history, lupus Raynaud's prediabetes.  Lupus diagnosed in her 5s.  Hypertension.  Coronary artery calcification noted in July 2022.  Crestor was started.  10 mg.  Excellent response.  Labs reviewed.  At goal less than 70.  Unfortunately she had a fall while walking at [employer], tripped on the carpet fell on her left shoulder.  Humeral fracture.  Dr. Jerl Santos has her in a sling.  Father died 77 myocardial infarction 1 sister TIA 66s.  Another sister MI 64.  Mother died 33 MI.  Brother had MI 74.  Works at [employer] branch.  Graduated from [employer] within [place].  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  ROS: No syncope no bleeding.  No chest pain no shortness of breath.  Studies Reviewed: Marland Kitchen   EKG Interpretation Date/Time:  Friday January 23 2023 09:43:55 EDT Ventricular Rate:  66 PR Interval:  152 QRS Duration:  92 QT Interval:  414 QTC Calculation: 434 R Axis:   32  Text Interpretation: Normal sinus rhythm Minimal voltage criteria for LVH, may be normal variant ( Cornell product ) Nonspecific T wave abnormality When compared with ECG of 29-Sep-2008 10:40, Nonspecific T wave abnormality now evident in Lateral leads Confirmed by Donato Schultz ([id number]) on 01/23/2023 10:08:41 AM    Coronary calcium score 02/08/2019 2-92, 71 percentile Risk Assessment/Calculations:            Physical Exam:   VS:  BP 116/68   Pulse 66   Ht 5' 4.5" (1.638 m)   Wt 151 lb (68.5 kg)   BMI 25.52 kg/m    Wt Readings from Last 3 Encounters:  01/23/23 151 lb (68.5 kg)  01/13/23 145 lb 8.1 oz (66 kg)  12/24/22 145 lb (65.8 kg)    GEN: Well  nourished, well developed in no acute distress NECK: No JVD; No carotid bruits CARDIAC: RRR, no murmurs, rubs, gallops RESPIRATORY:  Clear to auscultation without rales, wheezing or rhonchi  ABDOMEN: Soft, non-tender, non-distended EXTREMITIES:  No edema; currently in left arm sling.  Fractured left humerus.  Dr. Jerl Santos monitoring.  Pain.  ASSESSMENT AND PLAN: .    Coronary artery calcification - Score 92.  Crestor.  Had some hesitancies about this.  LDL should be less than 70.  At last check 57.  Excellent.  She is taking.  Also recommended aspirin 81 mg a day.  Watch for any signs of bleeding.  She can certainly start this after her high-dose NSAIDs are finished from her left humeral fracture.  Raynaud's disease without gangrene - Has been taking amlodipine.  Stable.  Lupus - Dr. Corliss Acadia Thammavong with rheumatology      Dispo: 2 years  Signed, Donato Schultz, MD

## 2023-01-23 NOTE — Patient Instructions (Signed)
Medication Instructions:  The current medical regimen is effective;  continue present plan and medications. Please start Asprin 81 mg a day after you finish your pain medications.  *If you need a refill on your cardiac medications before your next appointment, please call your pharmacy*  Follow-Up: At [facility], you and your health needs are our priority.  As part of our continuing mission to provide you with exceptional heart care, we have created designated Provider Care Teams.  These Care Teams include your primary Cardiologist (physician) and Advanced Practice Providers (APPs -  Physician Assistants and Nurse Practitioners) who all work together to provide you with the care you need, when you need it.  We recommend signing up for the patient portal called "MyChart".  Sign up information is provided on this After Visit Summary.  MyChart is used to connect with patients for Virtual Visits (Telemedicine).  Patients are able to view lab/test results, encounter notes, upcoming appointments, etc.  Non-urgent messages can be sent to your provider as well.   To learn more about what you can do with MyChart, go to [email or web address].    Your next appointment:   2 year(s)  Provider:   Donato Schultz, MD

## 2023-01-28 DIAGNOSIS — S42292A Other displaced fracture of upper end of left humerus, initial encounter for closed fracture: Secondary | ICD-10-CM | POA: Diagnosis not present

## 2023-02-18 DIAGNOSIS — S42292A Other displaced fracture of upper end of left humerus, initial encounter for closed fracture: Secondary | ICD-10-CM | POA: Diagnosis not present

## 2023-02-24 DIAGNOSIS — S42292D Other displaced fracture of upper end of left humerus, subsequent encounter for fracture with routine healing: Secondary | ICD-10-CM | POA: Diagnosis not present

## 2023-02-24 DIAGNOSIS — M25612 Stiffness of left shoulder, not elsewhere classified: Secondary | ICD-10-CM | POA: Diagnosis not present

## 2023-02-28 ENCOUNTER — Other Ambulatory Visit: Payer: Self-pay | Admitting: Rheumatology

## 2023-02-28 DIAGNOSIS — M3219 Other organ or system involvement in systemic lupus erythematosus: Secondary | ICD-10-CM

## 2023-03-01 ENCOUNTER — Other Ambulatory Visit: Payer: Self-pay | Admitting: Cardiology

## 2023-03-02 NOTE — Telephone Encounter (Signed)
Last Fill: 12/09/2022  Eye exam: 09/10/2022 WNL   Labs: 07/23/2022 CBC and CMP WNL.   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   Patient advised she is due to update her labs.   Okay to refill Plaquenil?

## 2023-03-03 ENCOUNTER — Other Ambulatory Visit: Payer: Self-pay | Admitting: *Deleted

## 2023-03-03 DIAGNOSIS — Z79899 Other long term (current) drug therapy: Secondary | ICD-10-CM | POA: Diagnosis not present

## 2023-03-03 DIAGNOSIS — K5730 Diverticulosis of large intestine without perforation or abscess without bleeding: Secondary | ICD-10-CM | POA: Diagnosis not present

## 2023-03-03 DIAGNOSIS — M3219 Other organ or system involvement in systemic lupus erythematosus: Secondary | ICD-10-CM

## 2023-03-03 DIAGNOSIS — S42292D Other displaced fracture of upper end of left humerus, subsequent encounter for fracture with routine healing: Secondary | ICD-10-CM | POA: Diagnosis not present

## 2023-03-03 DIAGNOSIS — M25612 Stiffness of left shoulder, not elsewhere classified: Secondary | ICD-10-CM | POA: Diagnosis not present

## 2023-03-03 DIAGNOSIS — K5904 Chronic idiopathic constipation: Secondary | ICD-10-CM | POA: Diagnosis not present

## 2023-03-03 LAB — CBC WITH DIFFERENTIAL/PLATELET
Absolute Monocytes: 551 cells/uL (ref 200–950)
Basophils Absolute: 81 cells/uL (ref 0–200)
Basophils Relative: 1.4 %
Eosinophils Absolute: 238 cells/uL (ref 15–500)
Eosinophils Relative: 4.1 %
HCT: 41.1 % (ref 35.0–45.0)
Hemoglobin: 13.4 g/dL (ref 11.7–15.5)
Lymphs Abs: 1856 cells/uL (ref 850–3900)
MCH: 27.7 pg (ref 27.0–33.0)
MCHC: 32.6 g/dL (ref 32.0–36.0)
MCV: 85.1 fL (ref 80.0–100.0)
MPV: 9.9 fL (ref 7.5–12.5)
Monocytes Relative: 9.5 %
Neutro Abs: 3074 cells/uL (ref 1500–7800)
Neutrophils Relative %: 53 %
Platelets: 308 10*3/uL (ref 140–400)
RBC: 4.83 10*6/uL (ref 3.80–5.10)
RDW: 12.6 % (ref 11.0–15.0)
Total Lymphocyte: 32 %
WBC: 5.8 10*3/uL (ref 3.8–10.8)

## 2023-03-05 DIAGNOSIS — S42292D Other displaced fracture of upper end of left humerus, subsequent encounter for fracture with routine healing: Secondary | ICD-10-CM | POA: Diagnosis not present

## 2023-03-05 DIAGNOSIS — M25612 Stiffness of left shoulder, not elsewhere classified: Secondary | ICD-10-CM | POA: Diagnosis not present

## 2023-03-05 NOTE — Progress Notes (Signed)
Sed rate normal, complements normal, double-stranded DNA negative, CBC and CMP normal, urine protein creatinine ratio normal.  Labs do not indicate an autoimmune disease flare.

## 2023-03-13 DIAGNOSIS — M25612 Stiffness of left shoulder, not elsewhere classified: Secondary | ICD-10-CM | POA: Diagnosis not present

## 2023-03-13 DIAGNOSIS — S42292D Other displaced fracture of upper end of left humerus, subsequent encounter for fracture with routine healing: Secondary | ICD-10-CM | POA: Diagnosis not present

## 2023-03-16 DIAGNOSIS — S42292D Other displaced fracture of upper end of left humerus, subsequent encounter for fracture with routine healing: Secondary | ICD-10-CM | POA: Diagnosis not present

## 2023-03-18 DIAGNOSIS — S42292D Other displaced fracture of upper end of left humerus, subsequent encounter for fracture with routine healing: Secondary | ICD-10-CM | POA: Diagnosis not present

## 2023-03-18 DIAGNOSIS — M25612 Stiffness of left shoulder, not elsewhere classified: Secondary | ICD-10-CM | POA: Diagnosis not present

## 2023-03-20 DIAGNOSIS — S42292D Other displaced fracture of upper end of left humerus, subsequent encounter for fracture with routine healing: Secondary | ICD-10-CM | POA: Diagnosis not present

## 2023-03-20 DIAGNOSIS — M25612 Stiffness of left shoulder, not elsewhere classified: Secondary | ICD-10-CM | POA: Diagnosis not present

## 2023-03-24 DIAGNOSIS — S42292D Other displaced fracture of upper end of left humerus, subsequent encounter for fracture with routine healing: Secondary | ICD-10-CM | POA: Diagnosis not present

## 2023-03-24 DIAGNOSIS — M25612 Stiffness of left shoulder, not elsewhere classified: Secondary | ICD-10-CM | POA: Diagnosis not present

## 2023-03-25 DIAGNOSIS — M25612 Stiffness of left shoulder, not elsewhere classified: Secondary | ICD-10-CM | POA: Diagnosis not present

## 2023-03-25 DIAGNOSIS — Z23 Encounter for immunization: Secondary | ICD-10-CM | POA: Diagnosis not present

## 2023-03-25 DIAGNOSIS — S42292D Other displaced fracture of upper end of left humerus, subsequent encounter for fracture with routine healing: Secondary | ICD-10-CM | POA: Diagnosis not present

## 2023-03-30 DIAGNOSIS — R7303 Prediabetes: Secondary | ICD-10-CM | POA: Diagnosis not present

## 2023-03-30 DIAGNOSIS — M25612 Stiffness of left shoulder, not elsewhere classified: Secondary | ICD-10-CM | POA: Diagnosis not present

## 2023-03-30 DIAGNOSIS — E7800 Pure hypercholesterolemia, unspecified: Secondary | ICD-10-CM | POA: Diagnosis not present

## 2023-03-30 DIAGNOSIS — S42292D Other displaced fracture of upper end of left humerus, subsequent encounter for fracture with routine healing: Secondary | ICD-10-CM | POA: Diagnosis not present

## 2023-03-30 DIAGNOSIS — I10 Essential (primary) hypertension: Secondary | ICD-10-CM | POA: Diagnosis not present

## 2023-04-01 DIAGNOSIS — M25612 Stiffness of left shoulder, not elsewhere classified: Secondary | ICD-10-CM | POA: Diagnosis not present

## 2023-04-01 DIAGNOSIS — S42292D Other displaced fracture of upper end of left humerus, subsequent encounter for fracture with routine healing: Secondary | ICD-10-CM | POA: Diagnosis not present

## 2023-04-06 DIAGNOSIS — S42292D Other displaced fracture of upper end of left humerus, subsequent encounter for fracture with routine healing: Secondary | ICD-10-CM | POA: Diagnosis not present

## 2023-04-06 DIAGNOSIS — M25612 Stiffness of left shoulder, not elsewhere classified: Secondary | ICD-10-CM | POA: Diagnosis not present

## 2023-04-08 DIAGNOSIS — M25612 Stiffness of left shoulder, not elsewhere classified: Secondary | ICD-10-CM | POA: Diagnosis not present

## 2023-04-08 DIAGNOSIS — S42292D Other displaced fracture of upper end of left humerus, subsequent encounter for fracture with routine healing: Secondary | ICD-10-CM | POA: Diagnosis not present

## 2023-04-08 DIAGNOSIS — Z23 Encounter for immunization: Secondary | ICD-10-CM | POA: Diagnosis not present

## 2023-04-11 ENCOUNTER — Other Ambulatory Visit: Payer: Self-pay | Admitting: Physician Assistant

## 2023-04-11 DIAGNOSIS — M3219 Other organ or system involvement in systemic lupus erythematosus: Secondary | ICD-10-CM

## 2023-04-13 DIAGNOSIS — S42292D Other displaced fracture of upper end of left humerus, subsequent encounter for fracture with routine healing: Secondary | ICD-10-CM | POA: Diagnosis not present

## 2023-04-13 DIAGNOSIS — M25612 Stiffness of left shoulder, not elsewhere classified: Secondary | ICD-10-CM | POA: Diagnosis not present

## 2023-04-15 DIAGNOSIS — H903 Sensorineural hearing loss, bilateral: Secondary | ICD-10-CM | POA: Diagnosis not present

## 2023-04-15 DIAGNOSIS — M7062 Trochanteric bursitis, left hip: Secondary | ICD-10-CM | POA: Diagnosis not present

## 2023-04-15 DIAGNOSIS — S42292D Other displaced fracture of upper end of left humerus, subsequent encounter for fracture with routine healing: Secondary | ICD-10-CM | POA: Diagnosis not present

## 2023-04-15 DIAGNOSIS — M1712 Unilateral primary osteoarthritis, left knee: Secondary | ICD-10-CM | POA: Diagnosis not present

## 2023-04-15 DIAGNOSIS — M25612 Stiffness of left shoulder, not elsewhere classified: Secondary | ICD-10-CM | POA: Diagnosis not present

## 2023-04-15 DIAGNOSIS — M25512 Pain in left shoulder: Secondary | ICD-10-CM | POA: Diagnosis not present

## 2023-04-17 DIAGNOSIS — S42292D Other displaced fracture of upper end of left humerus, subsequent encounter for fracture with routine healing: Secondary | ICD-10-CM | POA: Diagnosis not present

## 2023-04-17 DIAGNOSIS — M25612 Stiffness of left shoulder, not elsewhere classified: Secondary | ICD-10-CM | POA: Diagnosis not present

## 2023-04-20 DIAGNOSIS — M25612 Stiffness of left shoulder, not elsewhere classified: Secondary | ICD-10-CM | POA: Diagnosis not present

## 2023-04-20 DIAGNOSIS — S42292D Other displaced fracture of upper end of left humerus, subsequent encounter for fracture with routine healing: Secondary | ICD-10-CM | POA: Diagnosis not present

## 2023-04-27 DIAGNOSIS — S42292D Other displaced fracture of upper end of left humerus, subsequent encounter for fracture with routine healing: Secondary | ICD-10-CM | POA: Diagnosis not present

## 2023-04-27 DIAGNOSIS — M25612 Stiffness of left shoulder, not elsewhere classified: Secondary | ICD-10-CM | POA: Diagnosis not present

## 2023-04-29 DIAGNOSIS — M25612 Stiffness of left shoulder, not elsewhere classified: Secondary | ICD-10-CM | POA: Diagnosis not present

## 2023-04-29 DIAGNOSIS — S42292D Other displaced fracture of upper end of left humerus, subsequent encounter for fracture with routine healing: Secondary | ICD-10-CM | POA: Diagnosis not present

## 2023-05-04 DIAGNOSIS — E119 Type 2 diabetes mellitus without complications: Secondary | ICD-10-CM | POA: Diagnosis not present

## 2023-05-04 DIAGNOSIS — L821 Other seborrheic keratosis: Secondary | ICD-10-CM | POA: Diagnosis not present

## 2023-05-04 DIAGNOSIS — I10 Essential (primary) hypertension: Secondary | ICD-10-CM | POA: Diagnosis not present

## 2023-05-04 DIAGNOSIS — L905 Scar conditions and fibrosis of skin: Secondary | ICD-10-CM | POA: Diagnosis not present

## 2023-05-04 DIAGNOSIS — Z85828 Personal history of other malignant neoplasm of skin: Secondary | ICD-10-CM | POA: Diagnosis not present

## 2023-05-04 DIAGNOSIS — S42292D Other displaced fracture of upper end of left humerus, subsequent encounter for fracture with routine healing: Secondary | ICD-10-CM | POA: Diagnosis not present

## 2023-05-04 DIAGNOSIS — M329 Systemic lupus erythematosus, unspecified: Secondary | ICD-10-CM | POA: Diagnosis not present

## 2023-05-04 DIAGNOSIS — E7800 Pure hypercholesterolemia, unspecified: Secondary | ICD-10-CM | POA: Diagnosis not present

## 2023-05-04 DIAGNOSIS — R931 Abnormal findings on diagnostic imaging of heart and coronary circulation: Secondary | ICD-10-CM | POA: Diagnosis not present

## 2023-05-04 DIAGNOSIS — M25612 Stiffness of left shoulder, not elsewhere classified: Secondary | ICD-10-CM | POA: Diagnosis not present

## 2023-05-04 DIAGNOSIS — Z8781 Personal history of (healed) traumatic fracture: Secondary | ICD-10-CM | POA: Diagnosis not present

## 2023-05-06 DIAGNOSIS — M25612 Stiffness of left shoulder, not elsewhere classified: Secondary | ICD-10-CM | POA: Diagnosis not present

## 2023-05-06 DIAGNOSIS — S42292D Other displaced fracture of upper end of left humerus, subsequent encounter for fracture with routine healing: Secondary | ICD-10-CM | POA: Diagnosis not present

## 2023-05-20 ENCOUNTER — Other Ambulatory Visit: Payer: Self-pay | Admitting: Physician Assistant

## 2023-05-20 DIAGNOSIS — S42292D Other displaced fracture of upper end of left humerus, subsequent encounter for fracture with routine healing: Secondary | ICD-10-CM | POA: Diagnosis not present

## 2023-05-20 DIAGNOSIS — M25612 Stiffness of left shoulder, not elsewhere classified: Secondary | ICD-10-CM | POA: Diagnosis not present

## 2023-05-20 DIAGNOSIS — M3219 Other organ or system involvement in systemic lupus erythematosus: Secondary | ICD-10-CM

## 2023-05-20 NOTE — Telephone Encounter (Signed)
Last Fill: 03/02/2023  Eye exam: 09/10/2022 WNL    Labs: 03/03/2023 Sed rate normal, complements normal, double-stranded DNA negative, CBC and CMP normal, urine protein creatinine ratio normal.  Labs do not indicate an autoimmune disease flare.   Next Visit: 06/03/2023  Last Visit: 12/24/2022  DX: Other systemic lupus erythematosus with other organ involvement   Current Dose per office note 12/24/2022:  Plaquenil 200 mg 1 tablet by mouth every other day.   Okay to refill Plaquenil?

## 2023-05-20 NOTE — Progress Notes (Unsigned)
Office Visit Note  Patient: Theresa Graham             Date of Birth: 1951/01/25           MRN: [id number]             PCP: Merri Brunette, MD Referring: Merri Brunette, MD Visit Date: 06/03/2023 Occupation: @GUAROCC @  Subjective:  Left arm pain   History of Present Illness: Theresa Graham is a 72 y.o. female with history of systemic lupus erythematosus.  Patient remains on plaquenil 200 mg 1 tablet by mouth every other day.  She is tolerating Plaquenil without any side effects and has not had any gaps in therapy.  She denies any signs or symptoms of a systemic lupus flare.  She has not noticed any discoid lesions recently.  She has not had any recent rashes.  She denies any sores in her mouth or nose.  She has not had any sicca symptoms.  Patient states that she had a fall in June at which time she landed on her left arm and hit her head while walking in the mall.  Patient states that she fractured her left humerus and has been under the care of Dr. Jerl Santos.  She did not require surgical intervention but has been going to physical therapy 2 to 3 days/week.  She has been having x-rays on a regular basis to assess for healing.  Patient has been taking vitamin D 5000 units daily and a calcium supplement daily.  Patient has noted some increased fatigue which she attributes to difficulty sleeping at night getting comfortable with her left arm.  She has intermittent symptoms of Raynaud's phenomenon but denies any digital ulcerations.  She denies any other new or worsening symptoms.    Activities of Daily Living:  Patient reports morning stiffness for 5 minutes.   Patient Reports nocturnal pain.  Difficulty dressing/grooming: Denies Difficulty climbing stairs: Denies Difficulty getting out of chair: Denies Difficulty using hands for taps, buttons, cutlery, and/or writing: Denies  Review of Systems  Constitutional:  Positive for fatigue.  HENT:  Negative for mouth sores and mouth dryness.    Eyes:  Negative for dryness.  Respiratory:  Negative for shortness of breath.   Cardiovascular:  Negative for chest pain and palpitations.  Gastrointestinal:  Positive for constipation. Negative for blood in stool and diarrhea.  Endocrine: Negative for increased urination.  Genitourinary:  Negative for involuntary urination.  Musculoskeletal:  Positive for joint pain, joint pain and morning stiffness. Negative for gait problem, joint swelling, myalgias, muscle weakness, muscle tenderness and myalgias.  Skin:  Positive for sensitivity to sunlight. Negative for color change, rash and hair loss.  Allergic/Immunologic: Negative for susceptible to infections.  Neurological:  Negative for dizziness and headaches.  Hematological:  Negative for swollen glands.  Psychiatric/Behavioral:  Negative for depressed mood and sleep disturbance. The patient is not nervous/anxious.     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   Age-related osteoporosis without current pathological fracture 08/04/2016   Systemic lupus erythematosus (HCC) 08/01/2016   Vitamin D deficiency 08/01/2016   History of IBS 08/01/2016  Plantar fasciitis 08/01/2016   Raynaud's disease without gangrene 08/01/2016   History of hypertension 08/01/2016   High risk medication use 06/18/2016   Hypertension    HSV (herpes simplex virus) infection    Headache     Past Medical History:  Diagnosis Date   HSV (herpes simplex virus) infection    Hydrosalpinx    Hypertension    IBS (irritable bowel syndrome)    Insomnia    Lupus    sle and discoid  lupus   Lupus    Osteoporosis 2019   T score -2.8 statistically improved from prior study   Prediabetes    Raynaud's disease    Spinal stenosis    per patient    Systemic lupus erythematosus (HCC)     Family History  Problem Relation Age of Onset   Cancer Mother    Diabetes Mother    Hypertension Mother    Thyroid disease Mother    Heart disease Mother    Breast cancer Mother        Age 38   Diabetes Father    Hypertension Father    Cancer Father        COLON   Heart disease Father    Crohn's disease Sister    Hypertension Sister    Stroke Sister    Cancer Sister        Intestinal Ca   Colitis Sister    Sarcoidosis Sister    Cancer Sister    Crohn's disease Sister    Hypertension Brother    Hypertension Brother    Paget's disease of bone Brother    Hypertension Maternal Grandmother    Diabetes Maternal Grandmother    Hypertension Paternal Grandmother    Diabetes Paternal Grandmother    Diabetes Paternal Grandfather    Healthy Son    Rheum arthritis Son    Past Surgical History:  Procedure Laterality Date   ABDOMINAL HYSTERECTOMY  1987   TAH   back injection  05/2020   FOOT SURGERY Left 2010   hysterectomy  1987   ROTATOR CUFF REPAIR  2006   SHOULDER SURGERY FOR BONE SPUR  2010   STRESS FRACTURE LEG  08/2007   TENDON RELEASE Left 01/07/2018   TUBAL LIGATION  1978   WRIST SURGERY Right 09/23/2018   tendon release    WRIST SURGERY Left 12/2017   Social History   Social History Narrative   Not on file   Immunization History  Administered Date(s) Administered   Influenza Split 05/05/2012   Influenza, High Dose Seasonal PF 04/25/2017, 04/19/2018, 03/09/2019   Influenza,inj,Quad PF,6+ Mos 05/13/2013, 05/17/2014   Influenza-Unspecified 04/25/2017   PFIZER(Purple Top)SARS-COV-2 Vaccination 08/16/2019, 09/06/2019, 03/23/2020, 10/22/2020   Zoster Recombinant(Shingrix) 05/27/2017     Objective: Vital Signs: BP 130/76 (BP Location: Right Arm, Patient  Position: Sitting, Cuff Size: Normal)   Pulse 63   Resp 14   Ht 5' 4.5" (1.638 m)   Wt 146 lb (66.2 kg)   BMI 24.67 kg/m    Physical Exam Vitals and nursing note reviewed.  Constitutional:      Appearance: She is well-developed.  HENT:     Head: Normocephalic and atraumatic.  Eyes:     Conjunctiva/sclera: Conjunctivae normal.  Cardiovascular:     Rate and Rhythm: Normal rate and regular rhythm.     Heart sounds: Normal heart sounds.  Pulmonary:     Effort: Pulmonary effort is normal.     Breath sounds: Normal breath sounds.  Abdominal:  General: Bowel sounds are normal.     Palpations: Abdomen is soft.  Musculoskeletal:     Cervical back: Normal range of motion.  Lymphadenopathy:     Cervical: No cervical adenopathy.  Skin:    General: Skin is warm and dry.     Capillary Refill: Capillary refill takes less than 2 seconds.  Neurological:     Mental Status: She is alert and oriented to person, place, and time.  Psychiatric:        Behavior: Behavior normal.      Musculoskeletal Exam: C-spine has slightly limited range of motion with lateral rotation.  Right shoulder is full range of motion.  Left shoulder with limited abduction to about 110 degrees.  Elbow joints, wrist joints, MCPs, PIPs, DIPs have good range of motion with no synovitis.  Synovial thickening of bilateral second and third MCP joints.  PIP and DIP thickening consistent with osteoarthritis of both hands.  Tenderness in the DIP of the right fifth digit.  Hip joints have good range of motion with no groin pain.  Tenderness over the left trochanteric bursa.  Knee joints have good range of motion no warmth or effusion.  Ankle joints have good range of motion with no tenderness or joint swelling.  CDAI Exam: CDAI Score: -- Patient Global: --; Provider Global: -- Swollen: --; Tender: -- Joint Exam 06/03/2023   No joint exam has been documented for this visit   There is currently no information documented on  the homunculus. Go to the Rheumatology activity and complete the homunculus joint exam.  Investigation: No additional findings.  Imaging: No results found.  Recent Labs: Lab Results  Component Value Date   WBC 5.8 03/03/2023   HGB 13.4 03/03/2023   PLT 308 03/03/2023   NA 139 03/03/2023   K 4.3 03/03/2023   CL 101 03/03/2023   CO2 28 03/03/2023   GLUCOSE 93 03/03/2023   BUN 20 03/03/2023   CREATININE 0.82 03/03/2023   BILITOT 0.7 03/03/2023   ALKPHOS 69 08/04/2016   AST 13 03/03/2023   ALT 22 03/03/2023   PROT 7.8 03/03/2023   ALBUMIN 4.3 08/04/2016   CALCIUM 9.9 03/03/2023   GFRAA 70 05/31/2020    Speciality Comments: PLQ Eye Exam: 09/10/2022 WNL @ [facility] Ophthalmology follow up in 1 year Fosamax 07/2016  Procedures:  No procedures performed Allergies: Latex    Assessment / Plan:     Visit Diagnoses: Other systemic lupus erythematosus with other organ involvement (HCC) - Positive ANA, positive Smith, positive RNP, positive Ro, positive Raynauds, positive photosensitivity, positive arthritis, DLE:  She has not had any signs or symptoms of a systemic lupus flare.  She has clinically been doing well taking Plaquenil 200 mg 1 tablet by mouth every other day.  She is tolerating Plaquenil without any side effects and has not had any gaps in therapy.  She has no synovitis on examination today.  She has not had any oral or nasal ulcerations.  No discoid lesions.  She has intermittent symptoms of Raynaud's phenomenon and conservative treatment options were discussed.  Overall her energy level has been stable. Lab work from 03/03/23 was reviewed today in the office: dsDNA-, complements WNL, ESR WNL, and protein creatinine ratio WNL. Her next lab work will be due in January and every 5 months.  Future orders were placed today.  She will remain on plaquenil as prescribed. She was advised to notify us if she develops any new or worsening symptoms. She will follow  up in 5 months or  sooner if needed.  - Plan: Protein / creatinine ratio, urine, CBC with Differential/Platelet, COMPLETE METABOLIC PANEL WITH GFR, Anti-DNA antibody, double-stranded, C3 and C4, Sedimentation rate  High risk medication use - Plaquenil 200 mg 1 tablet by mouth every other day.  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  Stiffness of joints of both hands: Synovial thickening of bilateral second and third MCP joints.  No synovitis noted.  PIP and DIP thickening.  She has tenderness over the DIP of the right fifth digit.  Discussed the importance of joint protection and muscle strengthening.  She is given a handout of hand exercises to perform.  Discussed the use of arthritis compression gloves, paraffin wax, and Voltaren gel.  Raynaud's syndrome without gangrene: She experiences intermittent symptoms of Raynaud's phenomenon.  She had good capillary refill on examination today. Discussed the importance of keeping her core body temperature warm as well as wearing gloves and socks during the winter months.  No signs of sclerodactyly noted today.  Primary osteoarthritis of both knees: She has good range of motion of both knee joints on examination today.  No warmth or effusion noted.  Patient enjoys walking for exercise and is planning on restarting her walking regimen tomorrow.  Degeneration of intervertebral disc of lumbar region without discogenic back pain or lower extremity pain: Intermittent discomfort.   Age-related osteoporosis without current pathological fracture - DEXA on 08/01/20: improvement in spine -2.8 to -1.6. DEXA 1/22/24T-score -1.8 BMD 0.851 and AP spine -3% change.Left total femur -7%, right total femur 0%. She is taking a calcium and vitamin D supplement daily.  Her next DEXA will be due in January 2026.   Vitamin D deficiency - She is taking Vitamin D 5000  units daily  History of humerus fracture - Left-fell on the left arm while walking in the mall on 01[place]13/23.  Under the care of Dr. Jerl Santos.  Did not require surgical intervention. Going to PT 2-3 days/wk.  Take vitamin D and calcium supplement daily.    Other medical conditions are listed as follows:  History of IBS  History of hypertension - Blood pressure was 130/76 today in the office.  Orders: Orders Placed This Encounter  Procedures   Protein / creatinine ratio, urine   CBC with Differential/Platelet   COMPLETE METABOLIC PANEL WITH GFR   Anti-DNA antibody, double-stranded   C3 and C4   Sedimentation rate   No orders of the defined types were placed in this encounter.   Follow-Up Instructions: Return in about 5 months (around 11/01/2023) for Systemic lupus erythematosus.   Theresa Bienenstock, PA-C  Note - This record has been created using Dragon software.  Chart creation errors have been sought, but may not always  have been located. Such creation errors do not reflect on  the standard of medical care.

## 2023-05-22 DIAGNOSIS — M25612 Stiffness of left shoulder, not elsewhere classified: Secondary | ICD-10-CM | POA: Diagnosis not present

## 2023-05-22 DIAGNOSIS — S42292D Other displaced fracture of upper end of left humerus, subsequent encounter for fracture with routine healing: Secondary | ICD-10-CM | POA: Diagnosis not present

## 2023-05-25 DIAGNOSIS — S42292D Other displaced fracture of upper end of left humerus, subsequent encounter for fracture with routine healing: Secondary | ICD-10-CM | POA: Diagnosis not present

## 2023-05-25 DIAGNOSIS — M25612 Stiffness of left shoulder, not elsewhere classified: Secondary | ICD-10-CM | POA: Diagnosis not present

## 2023-05-26 ENCOUNTER — Telehealth: Payer: Self-pay | Admitting: Cardiology

## 2023-05-26 MED ORDER — ROSUVASTATIN CALCIUM 10 MG PO TABS: 10.0000 mg | ORAL_TABLET | Freq: Every day | ORAL | 2 refills | Status: AC

## 2023-05-26 NOTE — Telephone Encounter (Signed)
*  STAT* If patient is at the pharmacy, call can be transferred to refill team.   1. Which medications need to be refilled? (please list name of each medication and dose if known)   rosuvastatin (CRESTOR) 10 MG tablet    2. Which pharmacy/location (including street and city if local pharmacy) is medication to be sent to?  [facility] ([place], [place]  3. Do they need a 30 day or 90 day supply?  90 day

## 2023-05-26 NOTE — Telephone Encounter (Signed)
Pt's medication was sent to pt's pharmacy as requested. Confirmation received.  °

## 2023-05-27 DIAGNOSIS — R7303 Prediabetes: Secondary | ICD-10-CM | POA: Diagnosis not present

## 2023-05-27 DIAGNOSIS — M25612 Stiffness of left shoulder, not elsewhere classified: Secondary | ICD-10-CM | POA: Diagnosis not present

## 2023-05-27 DIAGNOSIS — I10 Essential (primary) hypertension: Secondary | ICD-10-CM | POA: Diagnosis not present

## 2023-05-27 DIAGNOSIS — Z713 Dietary counseling and surveillance: Secondary | ICD-10-CM | POA: Diagnosis not present

## 2023-05-27 DIAGNOSIS — S42292D Other displaced fracture of upper end of left humerus, subsequent encounter for fracture with routine healing: Secondary | ICD-10-CM | POA: Diagnosis not present

## 2023-05-29 DIAGNOSIS — M25612 Stiffness of left shoulder, not elsewhere classified: Secondary | ICD-10-CM | POA: Diagnosis not present

## 2023-05-29 DIAGNOSIS — S42292D Other displaced fracture of upper end of left humerus, subsequent encounter for fracture with routine healing: Secondary | ICD-10-CM | POA: Diagnosis not present

## 2023-06-01 DIAGNOSIS — S42292D Other displaced fracture of upper end of left humerus, subsequent encounter for fracture with routine healing: Secondary | ICD-10-CM | POA: Diagnosis not present

## 2023-06-01 DIAGNOSIS — M25612 Stiffness of left shoulder, not elsewhere classified: Secondary | ICD-10-CM | POA: Diagnosis not present

## 2023-06-03 ENCOUNTER — Encounter: Payer: Self-pay | Admitting: Physician Assistant

## 2023-06-03 ENCOUNTER — Ambulatory Visit: Payer: Medicare Other | Attending: Physician Assistant | Admitting: Physician Assistant

## 2023-06-03 VITALS — BP 130/76 | HR 63 | Resp 14 | Ht 64.5 in | Wt 146.0 lb

## 2023-06-03 DIAGNOSIS — Z8679 Personal history of other diseases of the circulatory system: Secondary | ICD-10-CM

## 2023-06-03 DIAGNOSIS — M25612 Stiffness of left shoulder, not elsewhere classified: Secondary | ICD-10-CM | POA: Diagnosis not present

## 2023-06-03 DIAGNOSIS — Z8781 Personal history of (healed) traumatic fracture: Secondary | ICD-10-CM | POA: Diagnosis not present

## 2023-06-03 DIAGNOSIS — M3219 Other organ or system involvement in systemic lupus erythematosus: Secondary | ICD-10-CM | POA: Diagnosis not present

## 2023-06-03 DIAGNOSIS — Z79899 Other long term (current) drug therapy: Secondary | ICD-10-CM | POA: Diagnosis not present

## 2023-06-03 DIAGNOSIS — E559 Vitamin D deficiency, unspecified: Secondary | ICD-10-CM

## 2023-06-03 DIAGNOSIS — Z8719 Personal history of other diseases of the digestive system: Secondary | ICD-10-CM | POA: Diagnosis not present

## 2023-06-03 DIAGNOSIS — M25641 Stiffness of right hand, not elsewhere classified: Secondary | ICD-10-CM

## 2023-06-03 DIAGNOSIS — S42292D Other displaced fracture of upper end of left humerus, subsequent encounter for fracture with routine healing: Secondary | ICD-10-CM | POA: Diagnosis not present

## 2023-06-03 DIAGNOSIS — M25642 Stiffness of left hand, not elsewhere classified: Secondary | ICD-10-CM | POA: Insufficient documentation

## 2023-06-03 DIAGNOSIS — M51369 Other intervertebral disc degeneration, lumbar region without mention of lumbar back pain or lower extremity pain: Secondary | ICD-10-CM

## 2023-06-03 DIAGNOSIS — L930 Discoid lupus erythematosus: Secondary | ICD-10-CM

## 2023-06-03 DIAGNOSIS — M810 Age-related osteoporosis without current pathological fracture: Secondary | ICD-10-CM

## 2023-06-03 DIAGNOSIS — I7300 Raynaud's syndrome without gangrene: Secondary | ICD-10-CM

## 2023-06-03 DIAGNOSIS — M170 Bilateral primary osteoarthritis of knee: Secondary | ICD-10-CM | POA: Diagnosis not present

## 2023-06-03 NOTE — Patient Instructions (Addendum)
Standing Labs We placed an order today for your standing lab work.   Please have your standing labs drawn in January   Please have your labs drawn 2 weeks prior to your appointment so that the provider can discuss your lab results at your appointment, if possible.  Please note that you may see your imaging and lab results in MyChart before we have reviewed them. We will contact you once all results are reviewed. Please allow our office up to 72 hours to thoroughly review all of the results before contacting the office for clarification of your results.  WALK-IN LAB HOURS  Monday through Thursday from 8:00 am -12:30 pm and 1:00 pm-5:00 pm and Friday from 8:00 am-12:00 pm.  Patients with office visits requiring labs will be seen before walk-in labs.  You may encounter longer than normal wait times. Please allow additional time. Wait times may be shorter on  Monday and Thursday afternoons.  We do not book appointments for walk-in labs. We appreciate your patience and understanding with our staff.   Labs are drawn by [facility]. Please bring your co-pay at the time of your lab draw.  You may receive a bill from [facility] for your lab work.  Please note if you are on Hydroxychloroquine and and an order has been placed for a Hydroxychloroquine level,  you will need to have it drawn 4 hours or more after your last dose.  If you wish to have your labs drawn at another location, please call the office 24 hours in advance so we can fax the orders.  The office is located at [address], [address], [place], [place] [address]   If you have any questions regarding directions or hours of operation,  please call [phone].   As a reminder, please drink plenty of water prior to coming for your lab work. Thanks!   Hand Exercises Hand exercises can be helpful for almost anyone. They can strengthen your hands and improve flexibility and movement. The exercises can also increase blood flow to the hands.  These results can make your work and daily tasks easier for you. Hand exercises can be especially helpful for people who have joint pain from arthritis or nerve damage from using their hands over and over. These exercises can also help people who injure a hand. Exercises Most of these hand exercises are gentle stretching and motion exercises. It is usually safe to do them often throughout the day. Warming up your hands before exercise may help reduce stiffness. You can do this with gentle massage or by placing your hands in warm water for 10-15 minutes. It is normal to feel some stretching, pulling, tightness, or mild discomfort when you begin new exercises. In time, this will improve. Remember to always be careful and stop right away if you feel sudden, very bad pain or your pain gets worse. You want to get better and be safe. Ask your health care provider which exercises are safe for you. Do exercises exactly as told by your provider and adjust them as told. Do not begin these exercises until told by your provider. Knuckle bend or "claw" fist  Stand or sit with your arm, hand, and all five fingers pointed straight up. Make sure to keep your wrist straight. Gently bend your fingers down toward your palm until the tips of your fingers are touching your palm. Keep your big knuckle straight and only bend the small knuckles in your fingers. Hold this position for 10 seconds. Straighten  your fingers back to your starting position. Repeat this exercise 5-10 times with each hand. Full finger fist  Stand or sit with your arm, hand, and all five fingers pointed straight up. Make sure to keep your wrist straight. Gently bend your fingers into your palm until the tips of your fingers are touching the middle of your palm. Hold this position for 10 seconds. Extend your fingers back to your starting position, stretching every joint fully. Repeat this exercise 5-10 times with each hand. Straight fist  Stand  or sit with your arm, hand, and all five fingers pointed straight up. Make sure to keep your wrist straight. Gently bend your fingers at the big knuckle, where your fingers meet your hand, and at the middle knuckle. Keep the knuckle at the tips of your fingers straight and try to touch the bottom of your palm. Hold this position for 10 seconds. Extend your fingers back to your starting position, stretching every joint fully. Repeat this exercise 5-10 times with each hand. Tabletop  Stand or sit with your arm, hand, and all five fingers pointed straight up. Make sure to keep your wrist straight. Gently bend your fingers at the big knuckle, where your fingers meet your hand, as far down as you can. Keep the small knuckles in your fingers straight. Think of forming a tabletop with your fingers. Hold this position for 10 seconds. Extend your fingers back to your starting position, stretching every joint fully. Repeat this exercise 5-10 times with each hand. Finger spread  Place your hand flat on a table with your palm facing down. Make sure your wrist stays straight. Spread your fingers and thumb apart from each other as far as you can until you feel a gentle stretch. Hold this position for 10 seconds. Bring your fingers and thumb tight together again. Hold this position for 10 seconds. Repeat this exercise 5-10 times with each hand. Making circles  Stand or sit with your arm, hand, and all five fingers pointed straight up. Make sure to keep your wrist straight. Make a circle by touching the tip of your thumb to the tip of your index finger. Hold for 10 seconds. Then open your hand wide. Repeat this motion with your thumb and each of your fingers. Repeat this exercise 5-10 times with each hand. Thumb motion  Sit with your forearm resting on a table and your wrist straight. Your thumb should be facing up toward the ceiling. Keep your fingers relaxed as you move your thumb. Lift your thumb up  as high as you can toward the ceiling. Hold for 10 seconds. Bend your thumb across your palm as far as you can, reaching the tip of your thumb for the small finger (pinkie) side of your palm. Hold for 10 seconds. Repeat this exercise 5-10 times with each hand. Grip strengthening  Hold a stress ball or other soft ball in the middle of your hand. Slowly increase the pressure, squeezing the ball as much as you can without causing pain. Think of bringing the tips of your fingers into the middle of your palm. All of your finger joints should bend when doing this exercise. Hold your squeeze for 10 seconds, then relax. Repeat this exercise 5-10 times with each hand. Contact a health care provider if: Your hand pain or discomfort gets much worse when you do an exercise. Your hand pain or discomfort does not improve within 2 hours after you exercise. If you have either of these problems, stop  doing these exercises right away. Do not do them again unless your provider says that you can. Get help right away if: You develop sudden, severe hand pain or swelling. If this happens, stop doing these exercises right away. Do not do them again unless your provider says that you can. This information is not intended to replace advice given to you by your health care provider. Make sure you discuss any questions you have with your health care provider. Document Revised: 07/22/2022 Document Reviewed: 07/22/2022 Elsevier Patient Education  2024 [employer].

## 2023-06-05 DIAGNOSIS — S42292D Other displaced fracture of upper end of left humerus, subsequent encounter for fracture with routine healing: Secondary | ICD-10-CM | POA: Diagnosis not present

## 2023-06-05 DIAGNOSIS — M25612 Stiffness of left shoulder, not elsewhere classified: Secondary | ICD-10-CM | POA: Diagnosis not present

## 2023-06-08 DIAGNOSIS — D120 Benign neoplasm of cecum: Secondary | ICD-10-CM | POA: Diagnosis not present

## 2023-06-08 DIAGNOSIS — Z860102 Personal history of hyperplastic colon polyps: Secondary | ICD-10-CM | POA: Diagnosis not present

## 2023-06-08 DIAGNOSIS — D124 Benign neoplasm of descending colon: Secondary | ICD-10-CM | POA: Diagnosis not present

## 2023-06-08 DIAGNOSIS — K635 Polyp of colon: Secondary | ICD-10-CM | POA: Diagnosis not present

## 2023-06-08 DIAGNOSIS — Z800 Family history of malignant neoplasm of digestive organs: Secondary | ICD-10-CM | POA: Diagnosis not present

## 2023-06-08 DIAGNOSIS — Z1211 Encounter for screening for malignant neoplasm of colon: Secondary | ICD-10-CM | POA: Diagnosis not present

## 2023-06-08 LAB — HM COLONOSCOPY

## 2023-06-10 DIAGNOSIS — M25612 Stiffness of left shoulder, not elsewhere classified: Secondary | ICD-10-CM | POA: Diagnosis not present

## 2023-06-10 DIAGNOSIS — S42292A Other displaced fracture of upper end of left humerus, initial encounter for closed fracture: Secondary | ICD-10-CM | POA: Diagnosis not present

## 2023-06-10 DIAGNOSIS — S42292D Other displaced fracture of upper end of left humerus, subsequent encounter for fracture with routine healing: Secondary | ICD-10-CM | POA: Diagnosis not present

## 2023-06-17 DIAGNOSIS — S42292D Other displaced fracture of upper end of left humerus, subsequent encounter for fracture with routine healing: Secondary | ICD-10-CM | POA: Diagnosis not present

## 2023-06-17 DIAGNOSIS — M25612 Stiffness of left shoulder, not elsewhere classified: Secondary | ICD-10-CM | POA: Diagnosis not present

## 2023-06-22 DIAGNOSIS — S42292D Other displaced fracture of upper end of left humerus, subsequent encounter for fracture with routine healing: Secondary | ICD-10-CM | POA: Diagnosis not present

## 2023-06-22 DIAGNOSIS — M25612 Stiffness of left shoulder, not elsewhere classified: Secondary | ICD-10-CM | POA: Diagnosis not present

## 2023-06-24 DIAGNOSIS — M25612 Stiffness of left shoulder, not elsewhere classified: Secondary | ICD-10-CM | POA: Diagnosis not present

## 2023-06-24 DIAGNOSIS — S42292D Other displaced fracture of upper end of left humerus, subsequent encounter for fracture with routine healing: Secondary | ICD-10-CM | POA: Diagnosis not present

## 2023-06-26 DIAGNOSIS — S42292D Other displaced fracture of upper end of left humerus, subsequent encounter for fracture with routine healing: Secondary | ICD-10-CM | POA: Diagnosis not present

## 2023-06-26 DIAGNOSIS — M25612 Stiffness of left shoulder, not elsewhere classified: Secondary | ICD-10-CM | POA: Diagnosis not present

## 2023-06-29 DIAGNOSIS — M25612 Stiffness of left shoulder, not elsewhere classified: Secondary | ICD-10-CM | POA: Diagnosis not present

## 2023-06-29 DIAGNOSIS — S42292D Other displaced fracture of upper end of left humerus, subsequent encounter for fracture with routine healing: Secondary | ICD-10-CM | POA: Diagnosis not present

## 2023-07-01 DIAGNOSIS — M25612 Stiffness of left shoulder, not elsewhere classified: Secondary | ICD-10-CM | POA: Diagnosis not present

## 2023-07-01 DIAGNOSIS — S42292D Other displaced fracture of upper end of left humerus, subsequent encounter for fracture with routine healing: Secondary | ICD-10-CM | POA: Diagnosis not present

## 2023-07-06 DIAGNOSIS — S42292D Other displaced fracture of upper end of left humerus, subsequent encounter for fracture with routine healing: Secondary | ICD-10-CM | POA: Diagnosis not present

## 2023-07-06 DIAGNOSIS — M25612 Stiffness of left shoulder, not elsewhere classified: Secondary | ICD-10-CM | POA: Diagnosis not present

## 2023-07-07 ENCOUNTER — Other Ambulatory Visit: Payer: Self-pay

## 2023-07-07 MED ORDER — VALACYCLOVIR HCL 500 MG PO TABS: 500.0000 mg | ORAL_TABLET | Freq: Every day | ORAL | 1 refills | Status: DC

## 2023-07-07 NOTE — Telephone Encounter (Signed)
Medication refill request: Valtrex  Last AEX:  09/17/22 Next AEX: 10/21/23 Last MMG (if hormonal medication request): n/a Refill authorized: #[id number] with 3 rf

## 2023-07-07 NOTE — Telephone Encounter (Signed)
Pt LVM in med refill line requesting rx refill on valtrex to [facility].

## 2023-07-08 DIAGNOSIS — M25512 Pain in left shoulder: Secondary | ICD-10-CM | POA: Diagnosis not present

## 2023-07-08 DIAGNOSIS — S42292D Other displaced fracture of upper end of left humerus, subsequent encounter for fracture with routine healing: Secondary | ICD-10-CM | POA: Diagnosis not present

## 2023-07-08 DIAGNOSIS — M25612 Stiffness of left shoulder, not elsewhere classified: Secondary | ICD-10-CM | POA: Diagnosis not present

## 2023-07-20 DIAGNOSIS — M25612 Stiffness of left shoulder, not elsewhere classified: Secondary | ICD-10-CM | POA: Diagnosis not present

## 2023-07-20 DIAGNOSIS — S42292D Other displaced fracture of upper end of left humerus, subsequent encounter for fracture with routine healing: Secondary | ICD-10-CM | POA: Diagnosis not present

## 2023-07-24 DIAGNOSIS — M25612 Stiffness of left shoulder, not elsewhere classified: Secondary | ICD-10-CM | POA: Diagnosis not present

## 2023-07-24 DIAGNOSIS — M25512 Pain in left shoulder: Secondary | ICD-10-CM | POA: Diagnosis not present

## 2023-07-24 DIAGNOSIS — S42292D Other displaced fracture of upper end of left humerus, subsequent encounter for fracture with routine healing: Secondary | ICD-10-CM | POA: Diagnosis not present

## 2023-07-27 DIAGNOSIS — M25612 Stiffness of left shoulder, not elsewhere classified: Secondary | ICD-10-CM | POA: Diagnosis not present

## 2023-07-27 DIAGNOSIS — S42292D Other displaced fracture of upper end of left humerus, subsequent encounter for fracture with routine healing: Secondary | ICD-10-CM | POA: Diagnosis not present

## 2023-07-31 DIAGNOSIS — S42292D Other displaced fracture of upper end of left humerus, subsequent encounter for fracture with routine healing: Secondary | ICD-10-CM | POA: Diagnosis not present

## 2023-07-31 DIAGNOSIS — M25612 Stiffness of left shoulder, not elsewhere classified: Secondary | ICD-10-CM | POA: Diagnosis not present

## 2023-08-03 DIAGNOSIS — S42292D Other displaced fracture of upper end of left humerus, subsequent encounter for fracture with routine healing: Secondary | ICD-10-CM | POA: Diagnosis not present

## 2023-08-03 DIAGNOSIS — M25612 Stiffness of left shoulder, not elsewhere classified: Secondary | ICD-10-CM | POA: Diagnosis not present

## 2023-08-05 DIAGNOSIS — M25512 Pain in left shoulder: Secondary | ICD-10-CM | POA: Diagnosis not present

## 2023-08-05 DIAGNOSIS — S42292D Other displaced fracture of upper end of left humerus, subsequent encounter for fracture with routine healing: Secondary | ICD-10-CM | POA: Diagnosis not present

## 2023-08-05 DIAGNOSIS — M25612 Stiffness of left shoulder, not elsewhere classified: Secondary | ICD-10-CM | POA: Diagnosis not present

## 2023-08-05 DIAGNOSIS — M7062 Trochanteric bursitis, left hip: Secondary | ICD-10-CM | POA: Diagnosis not present

## 2023-08-10 DIAGNOSIS — M25612 Stiffness of left shoulder, not elsewhere classified: Secondary | ICD-10-CM | POA: Diagnosis not present

## 2023-08-10 DIAGNOSIS — S42292D Other displaced fracture of upper end of left humerus, subsequent encounter for fracture with routine healing: Secondary | ICD-10-CM | POA: Diagnosis not present

## 2023-08-12 DIAGNOSIS — M25612 Stiffness of left shoulder, not elsewhere classified: Secondary | ICD-10-CM | POA: Diagnosis not present

## 2023-08-12 DIAGNOSIS — S42292D Other displaced fracture of upper end of left humerus, subsequent encounter for fracture with routine healing: Secondary | ICD-10-CM | POA: Diagnosis not present

## 2023-08-14 DIAGNOSIS — M7062 Trochanteric bursitis, left hip: Secondary | ICD-10-CM | POA: Diagnosis not present

## 2023-08-14 DIAGNOSIS — M6281 Muscle weakness (generalized): Secondary | ICD-10-CM | POA: Diagnosis not present

## 2023-08-17 DIAGNOSIS — Z1231 Encounter for screening mammogram for malignant neoplasm of breast: Secondary | ICD-10-CM | POA: Diagnosis not present

## 2023-08-17 DIAGNOSIS — M25612 Stiffness of left shoulder, not elsewhere classified: Secondary | ICD-10-CM | POA: Diagnosis not present

## 2023-08-17 DIAGNOSIS — S42292D Other displaced fracture of upper end of left humerus, subsequent encounter for fracture with routine healing: Secondary | ICD-10-CM | POA: Diagnosis not present

## 2023-08-18 ENCOUNTER — Encounter: Payer: Self-pay | Admitting: Obstetrics and Gynecology

## 2023-08-19 ENCOUNTER — Encounter: Payer: Self-pay | Admitting: Obstetrics and Gynecology

## 2023-08-19 DIAGNOSIS — M25612 Stiffness of left shoulder, not elsewhere classified: Secondary | ICD-10-CM | POA: Diagnosis not present

## 2023-08-19 DIAGNOSIS — S42292D Other displaced fracture of upper end of left humerus, subsequent encounter for fracture with routine healing: Secondary | ICD-10-CM | POA: Diagnosis not present

## 2023-08-24 DIAGNOSIS — S42292D Other displaced fracture of upper end of left humerus, subsequent encounter for fracture with routine healing: Secondary | ICD-10-CM | POA: Diagnosis not present

## 2023-08-24 DIAGNOSIS — M25612 Stiffness of left shoulder, not elsewhere classified: Secondary | ICD-10-CM | POA: Diagnosis not present

## 2023-08-26 DIAGNOSIS — S42292D Other displaced fracture of upper end of left humerus, subsequent encounter for fracture with routine healing: Secondary | ICD-10-CM | POA: Diagnosis not present

## 2023-08-26 DIAGNOSIS — M25612 Stiffness of left shoulder, not elsewhere classified: Secondary | ICD-10-CM | POA: Diagnosis not present

## 2023-08-31 ENCOUNTER — Other Ambulatory Visit: Payer: Self-pay | Admitting: Rheumatology

## 2023-08-31 ENCOUNTER — Other Ambulatory Visit: Payer: Self-pay | Admitting: *Deleted

## 2023-08-31 DIAGNOSIS — M3219 Other organ or system involvement in systemic lupus erythematosus: Secondary | ICD-10-CM

## 2023-08-31 MED ORDER — HYDROXYCHLOROQUINE SULFATE 200 MG PO TABS: 200.0000 mg | ORAL_TABLET | ORAL | 0 refills | Status: DC

## 2023-08-31 NOTE — Telephone Encounter (Signed)
 Patient states she is changing pharmacies to [facility].   Last Fill: 05/20/2023  Eye exam: 09/10/2022 WNL Has PLQ eye exam scheduled for April 2025   Labs: 03/03/2023 Sed rate normal, complements normal, double-stranded DNA negative, CBC and CMP normal, urine protein creatinine ratio normal.   Next Visit: 11/11/2023  Last Visit: 06/03/2023  DX:Other systemic lupus erythematosus with other organ involvement   Current Dose per office note 06/03/2023: Plaquenil  200 mg 1 tablet by mouth every other day.   Patient is aware she is due to update labs and plans to come to the office on Wednesday to update.   Okay to refill Plaquenil ?

## 2023-09-02 ENCOUNTER — Other Ambulatory Visit: Payer: Self-pay | Admitting: *Deleted

## 2023-09-02 DIAGNOSIS — M25612 Stiffness of left shoulder, not elsewhere classified: Secondary | ICD-10-CM | POA: Diagnosis not present

## 2023-09-02 DIAGNOSIS — M3219 Other organ or system involvement in systemic lupus erythematosus: Secondary | ICD-10-CM | POA: Diagnosis not present

## 2023-09-02 DIAGNOSIS — S42292D Other displaced fracture of upper end of left humerus, subsequent encounter for fracture with routine healing: Secondary | ICD-10-CM | POA: Diagnosis not present

## 2023-09-02 NOTE — Progress Notes (Signed)
CBC WNL

## 2023-09-03 LAB — COMPLETE METABOLIC PANEL WITH GFR
AG Ratio: 1.8 (calc) (ref 1.0–2.5)
ALT: 29 U/L (ref 6–29)
AST: 15 U/L (ref 10–35)
Albumin: 4.9 g/dL (ref 3.6–5.1)
Alkaline phosphatase (APISO): 64 U/L (ref 37–153)
BUN: 22 mg/dL (ref 7–25)
CO2: 28 mmol/L (ref 20–32)
Calcium: 10.3 mg/dL (ref 8.6–10.4)
Chloride: 102 mmol/L (ref 98–110)
Creat: 0.9 mg/dL (ref 0.60–1.00)
Globulin: 2.7 g/dL (ref 1.9–3.7)
Glucose, Bld: 91 mg/dL (ref 65–99)
Potassium: 4.3 mmol/L (ref 3.5–5.3)
Sodium: 138 mmol/L (ref 135–146)
Total Bilirubin: 0.6 mg/dL (ref 0.2–1.2)
Total Protein: 7.6 g/dL (ref 6.1–8.1)
eGFR: 68 mL/min/{1.73_m2} (ref >=60)

## 2023-09-03 LAB — CBC WITH DIFFERENTIAL/PLATELET
Absolute Lymphocytes: 1524 {cells}/uL (ref 850–3900)
Absolute Monocytes: 515 {cells}/uL (ref 200–950)
Basophils Absolute: 73 {cells}/uL (ref 0–200)
Basophils Relative: 1.4 %
Eosinophils Absolute: 270 {cells}/uL (ref 15–500)
Eosinophils Relative: 5.2 %
HCT: 42 % (ref 35.0–45.0)
Hemoglobin: 13.7 g/dL (ref 11.7–15.5)
MCH: 28.2 pg (ref 27.0–33.0)
MCHC: 32.6 g/dL (ref 32.0–36.0)
MCV: 86.4 fL (ref 80.0–100.0)
MPV: 9.9 fL (ref 7.5–12.5)
Monocytes Relative: 9.9 %
Neutro Abs: 2818 {cells}/uL (ref 1500–7800)
Neutrophils Relative %: 54.2 %
Platelets: 312 10*3/uL (ref 140–400)
RBC: 4.86 10*6/uL (ref 3.80–5.10)
RDW: 12.3 % (ref 11.0–15.0)
Total Lymphocyte: 29.3 %
WBC: 5.2 10*3/uL (ref 3.8–10.8)

## 2023-09-03 LAB — SEDIMENTATION RATE: Sed Rate: 2 mm/h (ref 0–30)

## 2023-09-03 LAB — PROTEIN / CREATININE RATIO, URINE
Creatinine, Urine: 90 mg/dL (ref 20–275)
Protein/Creat Ratio: 89 mg/g{creat} (ref 24–184)
Protein/Creatinine Ratio: 0.089 mg/mg{creat} (ref 0.024–0.184)
Total Protein, Urine: 8 mg/dL (ref 5–24)

## 2023-09-03 LAB — C3 AND C4
C3 Complement: 141 mg/dL (ref 83–193)
C4 Complement: 25 mg/dL (ref 15–57)

## 2023-09-03 LAB — ANTI-DNA ANTIBODY, DOUBLE-STRANDED: ds DNA Ab: <1 [IU]/mL

## 2023-09-03 NOTE — Progress Notes (Signed)
CMP WNL  Urine protein creatinine ratio WNL Complements WNL  ESR WNL

## 2023-09-04 DIAGNOSIS — S42292D Other displaced fracture of upper end of left humerus, subsequent encounter for fracture with routine healing: Secondary | ICD-10-CM | POA: Diagnosis not present

## 2023-09-04 DIAGNOSIS — M25612 Stiffness of left shoulder, not elsewhere classified: Secondary | ICD-10-CM | POA: Diagnosis not present

## 2023-09-04 NOTE — Progress Notes (Signed)
dsDNA is negative

## 2023-09-09 DIAGNOSIS — M25612 Stiffness of left shoulder, not elsewhere classified: Secondary | ICD-10-CM | POA: Diagnosis not present

## 2023-09-09 DIAGNOSIS — S42292D Other displaced fracture of upper end of left humerus, subsequent encounter for fracture with routine healing: Secondary | ICD-10-CM | POA: Diagnosis not present

## 2023-09-11 DIAGNOSIS — S42292D Other displaced fracture of upper end of left humerus, subsequent encounter for fracture with routine healing: Secondary | ICD-10-CM | POA: Diagnosis not present

## 2023-09-11 DIAGNOSIS — M25612 Stiffness of left shoulder, not elsewhere classified: Secondary | ICD-10-CM | POA: Diagnosis not present

## 2023-09-14 DIAGNOSIS — M25612 Stiffness of left shoulder, not elsewhere classified: Secondary | ICD-10-CM | POA: Diagnosis not present

## 2023-09-14 DIAGNOSIS — S42292D Other displaced fracture of upper end of left humerus, subsequent encounter for fracture with routine healing: Secondary | ICD-10-CM | POA: Diagnosis not present

## 2023-09-16 DIAGNOSIS — S42292D Other displaced fracture of upper end of left humerus, subsequent encounter for fracture with routine healing: Secondary | ICD-10-CM | POA: Diagnosis not present

## 2023-09-16 DIAGNOSIS — M25612 Stiffness of left shoulder, not elsewhere classified: Secondary | ICD-10-CM | POA: Diagnosis not present

## 2023-09-21 DIAGNOSIS — M25612 Stiffness of left shoulder, not elsewhere classified: Secondary | ICD-10-CM | POA: Diagnosis not present

## 2023-09-21 DIAGNOSIS — S42292D Other displaced fracture of upper end of left humerus, subsequent encounter for fracture with routine healing: Secondary | ICD-10-CM | POA: Diagnosis not present

## 2023-09-23 DIAGNOSIS — S42292D Other displaced fracture of upper end of left humerus, subsequent encounter for fracture with routine healing: Secondary | ICD-10-CM | POA: Diagnosis not present

## 2023-09-23 DIAGNOSIS — M25612 Stiffness of left shoulder, not elsewhere classified: Secondary | ICD-10-CM | POA: Diagnosis not present

## 2023-09-28 DIAGNOSIS — S42292D Other displaced fracture of upper end of left humerus, subsequent encounter for fracture with routine healing: Secondary | ICD-10-CM | POA: Diagnosis not present

## 2023-09-28 DIAGNOSIS — M25612 Stiffness of left shoulder, not elsewhere classified: Secondary | ICD-10-CM | POA: Diagnosis not present

## 2023-10-02 DIAGNOSIS — S42292D Other displaced fracture of upper end of left humerus, subsequent encounter for fracture with routine healing: Secondary | ICD-10-CM | POA: Diagnosis not present

## 2023-10-02 DIAGNOSIS — M25612 Stiffness of left shoulder, not elsewhere classified: Secondary | ICD-10-CM | POA: Diagnosis not present

## 2023-10-07 NOTE — Progress Notes (Signed)
 73 y.o. G61P1001 Widowed [place] American female here for a breast and pelvic exam.    The patient is also followed for osteoporosis and hx HSV. She has a red spot on the skin of her right breast.  She wants a pap today.   Broke her arm and shoulder from a fall in June, 2024.  Had a sling and no surgery.  Has done physical therapy.   MRI in follow up showing some osteonecrosis.  Her orthopod is expecting a year to heal from the from the fracture.   Patient stopped Fosamax after consultation with Dr. Erlene Senters, who follows her for lupus.   Rare outbreak of HSV and rare use of Valtrex.   Not sexually active since her husband passed away.   Asking about risk of uterine cancer with use of hair products.   PCP: Merri Brunette, MD   No LMP recorded. Patient has had a hysterectomy.           Sexually active: No.  The current method of family planning is status post hysterectomy.    Menopausal hormone therapy:  n/a Exercising: Yes.     Walking, zumba Smoker:  no  OB History     Gravida  1   Para  1   Term  1   Preterm      AB      Living  1      SAB      IAB      Ectopic      Multiple      Live Births              HEALTH MAINTENANCE: Last 2 paps:  09/05/21 neg, 12-20/19 Neg,  History of abnormal Pap or positive HPV:  no Mammogram:  08/17/23 Breast density Cat B, BI-RADS CAT 1 neg Colonoscopy:  06/08/23 Bone Density:  08/11/22  Result  osteopenia of spine.   Immunization History  Administered Date(s) Administered   Influenza Split 05/05/2012   Influenza, High Dose Seasonal PF 04/25/2017, 04/19/2018, 03/09/2019   Influenza,inj,Quad PF,6+ Mos 05/13/2013, 05/17/2014   Influenza-Unspecified 04/25/2017   PFIZER(Purple Top)SARS-COV-2 Vaccination 08/16/2019, 09/06/2019, 03/23/2020, 10/22/2020   Zoster Recombinant(Shingrix) 05/27/2017      reports that she has never smoked. She has never been exposed to tobacco smoke. She has never used smokeless tobacco.  She reports current alcohol use. She reports that she does not use drugs.  Past Medical History:  Diagnosis Date   HSV (herpes simplex virus) infection    Hydrosalpinx    Hypertension    IBS (irritable bowel syndrome)    Insomnia    Lupus    sle and discoid lupus   Lupus    Osteoporosis 2019   T score -2.8 statistically improved from prior study   Prediabetes    Raynaud's disease    Spinal stenosis    per patient    Systemic lupus erythematosus (HCC)     Past Surgical History:  Procedure Laterality Date   ABDOMINAL HYSTERECTOMY  1987   TAH   back injection  05/2020   FOOT SURGERY Left 2010   hysterectomy  1987   ROTATOR CUFF REPAIR  2006   SHOULDER SURGERY FOR BONE SPUR  2010   STRESS FRACTURE LEG  08/2007   TENDON RELEASE Left 01/07/2018   TUBAL LIGATION  1978   WRIST SURGERY Right 09/23/2018   tendon release    WRIST SURGERY Left 12/2017    Current Outpatient Medications  Medication Sig Dispense  Refill   amLODipine (NORVASC) 5 MG tablet Take 5 mg by mouth daily.       Ascorbic Acid (VITAMIN C CR) 1000 MG TBCR SMARTSIG:1 By Mouth     bimatoprost (LATISSE) 0.03 % ophthalmic solution Apply 1 drop to eye daily.     CALCIUM 600 1500 (600 Ca) MG TABS tablet SMARTSIG:1 By Mouth     cetirizine (ZYRTEC) 10 MG tablet Take 1 tablet by mouth as needed.     Cholecalciferol (VITAMIN D PO) Take 5,000 Units by mouth.     diclofenac sodium (VOLTAREN) 1 % GEL Apply 3 g topically 3 (three) times daily as needed.     hydroxychloroquine (PLAQUENIL) 200 MG tablet Take 1 tablet (200 mg total) by mouth every other day. 45 tablet 0   linaclotide (LINZESS) 290 MCG CAPS capsule Take by mouth daily before breakfast.      magnesium oxide (MAG-OX) 400 MG tablet Take 400 mg by mouth daily.     montelukast (SINGULAIR) 10 MG tablet Take 10 mg by mouth as needed.     Multiple Vitamin (MULTIVITAMIN) capsule Take 1 capsule by mouth daily.       polyethylene glycol-electrolytes (NULYTELY) 420 g  solution MIX AND DRINK AS DIRECTED     Probiotic Product (PROBIOTIC-10 PO) Take 1 tablet by mouth daily.     rosuvastatin (CRESTOR) 10 MG tablet Take 1 tablet (10 mg total) by mouth daily. 90 tablet 2   triamcinolone cream (KENALOG) 0.1 % APP AA ON THE SKIN D PRN  0   triamterene-hydrochlorothiazide (MAXZIDE-25) 37.5-25 MG tablet Take 1 tablet by mouth daily.     valACYclovir (VALTREX) 500 MG tablet Take 1 tablet (500 mg total) by mouth daily. Take daily for prevention.  Take one tablet by mouth twice a day for 3 day for an outbreak. 90 tablet 1   No current facility-administered medications for this visit.    ALLERGIES: Latex  Family History  Problem Relation Age of Onset   Cancer Mother    Diabetes Mother    Hypertension Mother    Thyroid disease Mother    Heart disease Mother    Breast cancer Mother        Age 64   Diabetes Father    Hypertension Father    Cancer Father        COLON   Heart disease Father    Crohn's disease Sister    Hypertension Sister    Stroke Sister    Cancer Sister        Intestinal Ca   Colitis Sister    Sarcoidosis Sister    Cancer Sister    Crohn's disease Sister    Hypertension Brother    Hypertension Brother    Paget's disease of bone Brother    Hypertension Maternal Grandmother    Diabetes Maternal Grandmother    Hypertension Paternal Grandmother    Diabetes Paternal Grandmother    Diabetes Paternal Grandfather    Healthy Son    Rheum arthritis Son     Review of Systems  All other systems reviewed and are negative.   PHYSICAL EXAM:  BP 122/74 (BP Location: Left Arm, Patient Position: Sitting, Cuff Size: Small)   Pulse 62   Ht 5\' 4"  (1.626 m)   Wt 149 lb (67.6 kg)   SpO2 97%   BMI 25.58 kg/m     General appearance: alert, cooperative and appears stated age Head: normocephalic, without obvious abnormality, atraumatic Neck: no adenopathy, supple, symmetrical, trachea  midline and thyroid normal to inspection and palpation Lungs:  clear to auscultation bilaterally Breasts: right breast with 3 mm cherry hemangioma of skin and left breast with normal appearance, no masses or tenderness, No nipple retraction or dimpling, No nipple discharge or bleeding, No axillary adenopathy Heart: regular rate and rhythm Abdomen: soft, non-tender; no masses, no organomegaly Extremities: extremities normal, atraumatic, no cyanosis or edema Skin: skin color, texture, turgor normal. No rashes or lesions Lymph nodes: cervical, supraclavicular, and axillary nodes normal. Neurologic: grossly normal  Pelvic: External genitalia:  no lesions              No abnormal inguinal nodes palpated.              Urethra:  normal appearing urethra with no masses, tenderness or lesions              Bartholins and Skenes: normal                 Vagina: atrophy noted.               Cervix:  absent              Pap taken: No. Bimanual Exam:  Uterus:  absent              Adnexa: no mass, fullness, tenderness              Rectal exam: Yes.  .  Confirms.              Anus:  normal sphincter tone, no lesions  Chaperone was present for exam:  Edwin Dada, CMA  ASSESSMENT: Encounter for breast and pelvic exam.  Personal history of other medical condition.  Status post TAH.  Ovaries remain.  Hx osteoporosis.  Now with osteopenia.  Off Fosamax.  Had left arm and shoulder fracture.  Hx HSV.  Using valtrex prn. Encounter for medication monitoring.  Resolution of right hydrosalpinx.   PLAN: Mammogram screening discussed. Self breast awareness reviewed. Pap and HRV collected:  no.  Not indicated.  Guidelines for Calcium, Vitamin D, regular exercise program including cardiovascular and weight bearing exercise. Medication refills:  Valtrex Rx.   We discussed vaginal atrophy and potential need for treatment if she becomes sexually active.  Order sent to [facility] for bone density due to recent fracture.    Possible candidate for Prolia.  Follow up:  yearly and prn.      Additional counseling given.  Yes.  . 30 min  total time was spent for this patient encounter, including preparation, face-to-face counseling with the patient, coordination of care, and documentation of the encounter in addition to doing the breast and pelvic exam.

## 2023-10-12 DIAGNOSIS — M25612 Stiffness of left shoulder, not elsewhere classified: Secondary | ICD-10-CM | POA: Diagnosis not present

## 2023-10-12 DIAGNOSIS — S42292D Other displaced fracture of upper end of left humerus, subsequent encounter for fracture with routine healing: Secondary | ICD-10-CM | POA: Diagnosis not present

## 2023-10-16 DIAGNOSIS — M25612 Stiffness of left shoulder, not elsewhere classified: Secondary | ICD-10-CM | POA: Diagnosis not present

## 2023-10-16 DIAGNOSIS — S42292D Other displaced fracture of upper end of left humerus, subsequent encounter for fracture with routine healing: Secondary | ICD-10-CM | POA: Diagnosis not present

## 2023-10-21 ENCOUNTER — Ambulatory Visit (INDEPENDENT_AMBULATORY_CARE_PROVIDER_SITE_OTHER): Payer: Medicare Other | Admitting: Obstetrics and Gynecology

## 2023-10-21 ENCOUNTER — Encounter: Payer: Self-pay | Admitting: Obstetrics and Gynecology

## 2023-10-21 VITALS — BP 122/74 | HR 62 | Ht 64.0 in | Wt 149.0 lb

## 2023-10-21 DIAGNOSIS — Z01419 Encounter for gynecological examination (general) (routine) without abnormal findings: Secondary | ICD-10-CM

## 2023-10-21 DIAGNOSIS — Z8781 Personal history of (healed) traumatic fracture: Secondary | ICD-10-CM | POA: Diagnosis not present

## 2023-10-21 DIAGNOSIS — Z5181 Encounter for therapeutic drug level monitoring: Secondary | ICD-10-CM

## 2023-10-21 DIAGNOSIS — M8588 Other specified disorders of bone density and structure, other site: Secondary | ICD-10-CM

## 2023-10-21 DIAGNOSIS — B009 Herpesviral infection, unspecified: Secondary | ICD-10-CM | POA: Diagnosis not present

## 2023-10-21 DIAGNOSIS — Z9189 Other specified personal risk factors, not elsewhere classified: Secondary | ICD-10-CM | POA: Diagnosis not present

## 2023-10-21 DIAGNOSIS — S42292D Other displaced fracture of upper end of left humerus, subsequent encounter for fracture with routine healing: Secondary | ICD-10-CM | POA: Diagnosis not present

## 2023-10-21 DIAGNOSIS — Z9289 Personal history of other medical treatment: Secondary | ICD-10-CM

## 2023-10-21 DIAGNOSIS — M25612 Stiffness of left shoulder, not elsewhere classified: Secondary | ICD-10-CM | POA: Diagnosis not present

## 2023-10-21 MED ORDER — VALACYCLOVIR HCL 500 MG PO TABS: 500.0000 mg | ORAL_TABLET | Freq: Every day | ORAL | 3 refills | Status: AC

## 2023-10-21 NOTE — Patient Instructions (Signed)

## 2023-10-28 DIAGNOSIS — S42292D Other displaced fracture of upper end of left humerus, subsequent encounter for fracture with routine healing: Secondary | ICD-10-CM | POA: Diagnosis not present

## 2023-10-28 DIAGNOSIS — M25612 Stiffness of left shoulder, not elsewhere classified: Secondary | ICD-10-CM | POA: Diagnosis not present

## 2023-10-29 NOTE — Progress Notes (Signed)
 Office Visit Note  Patient: Theresa Graham             Date of Birth: 1950/10/16           MRN: [id number]             PCP: Faustina Hood, MD Referring: Faustina Hood, MD Visit Date: 11/11/2023 Occupation: @GUAROCC @  Subjective:  Pain in left arm and right hand   History of Present Illness: Theresa Graham is a 73 y.o. female with systemic lupus, discoid lupus, osteoarthritis, degenerative disc disease and osteoporosis.  She returns today after her last visit in November 2024.  She had left humerus fracture in June 2024.  She states she continues to have some pain and discomfort in the left shoulder.  She states she had MRI of her her left shoulder in January 2025 by Dr. Deeann Fare.  She states that she was told there was osteonecrosis of the bone.  She has been going to physical therapy.  He has noticed some improvement after physical therapy.  She has been experiencing some pain and swelling in her right hand.    Activities of Daily Living:  Patient reports morning stiffness for  5 minutes.   Patient Denies nocturnal pain.  Difficulty dressing/grooming: Denies Difficulty climbing stairs: Denies Difficulty getting out of chair: Denies Difficulty using hands for taps, buttons, cutlery, and/or writing: Denies  Review of Systems  Constitutional:  Positive for fatigue.  HENT:  Negative for mouth sores and mouth dryness.   Eyes:  Negative for dryness.  Respiratory:  Negative for shortness of breath.   Cardiovascular:  Negative for chest pain and palpitations.  Gastrointestinal:  Positive for constipation. Negative for blood in stool and diarrhea.  Endocrine: Negative for increased urination.  Genitourinary:  Negative for involuntary urination.  Musculoskeletal:  Positive for joint pain, joint pain, joint swelling, muscle weakness and morning stiffness. Negative for gait problem, myalgias, muscle tenderness and myalgias.  Skin:  Positive for sensitivity to sunlight. Negative for color  change, rash and hair loss.  Allergic/Immunologic: Negative for susceptible to infections.  Neurological:  Negative for dizziness and headaches.  Hematological:  Negative for swollen glands.  Psychiatric/Behavioral:  Positive for sleep disturbance. Negative for depressed mood. The patient is not nervous/anxious.     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   Age-related osteoporosis without current pathological fracture 08/04/2016   Systemic lupus erythematosus (HCC) 08/01/2016   Vitamin D  deficiency 08/01/2016   History of IBS 08/01/2016   Plantar fasciitis 08/01/2016   Raynaud's disease without gangrene 08/01/2016   History of hypertension 08/01/2016   High risk medication use 06/18/2016   Hypertension    HSV (herpes simplex virus) infection    Headache     Past Medical History:  Diagnosis Date   HSV (herpes simplex virus) infection    Hydrosalpinx    Hypertension    IBS (irritable bowel syndrome)    Insomnia    Lupus    sle and discoid lupus   Lupus    Osteoporosis 2019   T score -2.8 statistically improved from prior study   Prediabetes  Raynaud's disease    Spinal stenosis    per patient    Systemic lupus erythematosus (HCC)     Family History  Problem Relation Age of Onset   Cancer Mother    Diabetes Mother    Hypertension Mother    Thyroid  disease Mother    Heart disease Mother    Breast cancer Mother        Age 26   Diabetes Father    Hypertension Father    Cancer Father        COLON   Heart disease Father    Crohn's disease Sister    Hypertension Sister    Stroke  Sister    Cancer Sister        Intestinal Ca   Colitis Sister    Sarcoidosis Sister    Cancer Sister    Crohn's disease Sister    Hypertension Brother    Hypertension Brother    Paget's disease of bone Brother    Hypertension Maternal Grandmother    Diabetes Maternal Grandmother    Hypertension Paternal Grandmother    Diabetes Paternal Grandmother    Diabetes Paternal Grandfather    Healthy Son    Rheum arthritis Son    Past Surgical History:  Procedure Laterality Date   ABDOMINAL HYSTERECTOMY  1987   TAH   back injection  05/2020   FOOT SURGERY Left 2010   hysterectomy  1987   ROTATOR CUFF REPAIR  2006   SHOULDER SURGERY FOR BONE SPUR  2010   STRESS FRACTURE LEG  08/2007   TENDON RELEASE Left 01/07/2018   TUBAL LIGATION  1978   WRIST SURGERY Right 09/23/2018   tendon release    WRIST SURGERY Left 12/2017   Social History   Social History Narrative   Not on file   Immunization History  Administered Date(s) Administered   Influenza Split 05/05/2012   Influenza, High Dose Seasonal PF 04/25/2017, 04/19/2018, 03/09/2019   Influenza,inj,Quad PF,6+ Mos 05/13/2013, 05/17/2014   Influenza-Unspecified 04/25/2017   PFIZER(Purple Top)SARS-COV-2 Vaccination 08/16/2019, 09/06/2019, 03/23/2020, 10/22/2020   Zoster Recombinant(Shingrix) 05/27/2017     Objective: Vital Signs: BP 134/79 (BP Location: Right Arm, Patient Position: Sitting, Cuff Size: Normal)   Pulse 65   Resp 14   Ht 5' 4.25" (1.632 m)   Wt 150 lb (68 kg)   BMI 25.55 kg/m    Physical Exam Vitals and nursing note reviewed.  Constitutional:      Appearance: She is well-developed.  HENT:     Head: Normocephalic and atraumatic.  Eyes:     Conjunctiva/sclera: Conjunctivae normal.  Cardiovascular:     Rate and Rhythm: Normal rate and regular rhythm.     Heart sounds: Normal heart sounds.  Pulmonary:     Effort: Pulmonary effort is normal.     Breath sounds: Normal breath sounds.  Abdominal:      General: Bowel sounds are normal.     Palpations: Abdomen is soft.  Musculoskeletal:     Cervical back: Normal range of motion.  Lymphadenopathy:     Cervical: No cervical adenopathy.  Skin:    General: Skin is warm and dry.     Capillary Refill: Capillary refill takes less than 2 seconds.  Neurological:     Mental Status: She is alert and oriented to person, place, and time.  Psychiatric:        Behavior: Behavior normal.      Musculoskeletal Exam: She had good range of motion of the cervical  spine without discomfort.  Both shoulder joints were in good range of motion and without discomfort.  Elbow joints, wrist joints were in good range of motion.  She had thickening of the bilateral 2nd and 3rd MCP joints with no synovitis.  DIP thickening was noted in bilateral hands especially her right finger DIP joint.  No synovitis was noted.  Hip joints and knee joints were in good range of motion.  There was no warmth swelling or effusion.  There was no tenderness over ankles or MTPs.  CDAI Exam: CDAI Score: -- Patient Global: --; Provider Global: -- Swollen: --; Tender: -- Joint Exam 11/11/2023   No joint exam has been documented for this visit   There is currently no information documented on the homunculus. Go to the Rheumatology activity and complete the homunculus joint exam.  Investigation: No additional findings.  Imaging: No results found.  Recent Labs: Lab Results  Component Value Date   WBC 5.2 09/02/2023   HGB 13.7 09/02/2023   PLT 312 09/02/2023   NA 138 09/02/2023   K 4.3 09/02/2023   CL 102 09/02/2023   CO2 28 09/02/2023   GLUCOSE 91 09/02/2023   BUN 22 09/02/2023   CREATININE 0.90 09/02/2023   BILITOT 0.6 09/02/2023   ALKPHOS 69 08/04/2016   AST 15 09/02/2023   ALT 29 09/02/2023   PROT 7.6 09/02/2023   ALBUMIN 4.3 08/04/2016   CALCIUM  10.3 09/02/2023   GFRAA 70 05/31/2020   November 04, 2023 T-score -1.4, BMD 0.897 AP spine  Speciality Comments: PLQ Eye  Exam: 09/10/2022 WNL @ [facility] follow up in 1 year Fosamax  07/2016  Patient states she has a PLQ eye exam scheduled around 11/25/2023.  Procedures:  No procedures performed Allergies: Latex   Assessment / Plan:     Visit Diagnoses: Other systemic lupus erythematosus with other organ involvement (HCC) - positive ANA, positive Smith, positive RNP, positive Ro, positive Raynauds, positive photosensitivity, positive arthritis, DLE: -Repeat labs only show positive ANA and positive SSA antibody.  All other antibodies have become negative.  She has not had a flare of lupus in a long time.  Complements have been normal.  She has not had a flare of discoid lupus.  She has been doing well on a low-dose Plaquenil .  Labs from September 02, 2023 were reviewed which showed CBC and CMP were normal.  Complements were normal and sed rate was normal.  Double-stranded ENA was negative.  Plan: hydroxychloroquine  (PLAQUENIL ) 200 MG tablet, Protein / creatinine ratio, urine, Anti-DNA antibody, double-stranded, C3 and C4, Sedimentation rate, ANA, RNP Antibody, Anti-Smith antibody, Sjogrens syndrome-A extractable nuclear antibody.  Prescription refill for Plaquenil  was sent per patient's request.  High risk medication use - Plaquenil  200 mg 1 tablet by mouth every other day. PLQ Eye Exam: 09/10/2022 -repeat eye exam is scheduled in May 2025.  Plan: CBC with Differential/Platelet, Comprehensive metabolic panel with GFR in July.  Information remains issues placed in the AVS.  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  She was advised to use sunscreen and sun protection.  Stiffness of joints of both hands-she continues to have pain and stiffness in her hands.  She has synovial thickening with no synovitis.  She also has underlying osteoarthritis involving DIP and PIP joints.  Use of compression gloves and topical agents was discussed.  Raynaud's syndrome without gangrene-currently not active.  Primary  osteoarthritis of both knees-she continues to have some discomfort in her knee joints.  She is followed at  [facility].  Degeneration of intervertebral disc of lumbar region without discogenic back pain or lower extremity pain-she had good mobility without radiculopathy or discomfort today.  Age-related osteoporosis without current pathological fracture - DEXA 1/22/24T-score -1.8 BMD 0.851 and AP spine -3% change.Left total femur -7%, right total femur 0%.  Patient had repeat DEXA scan on November 04, 2023 by her GYN which showed T-score of -1.4, BMD 0.897 in the AP spine.  Results were reviewed with the patient.  Use of calcium  rich diet, vitamin D  and exercise was emphasized.  Vitamin D  deficiency-vitamin D  was 45 on September 16, 2021.  She has been taking vitamin D .  History of humerus fracture - Left-fell on the left arm while walking in the mall on 01/13/23.  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  She has been under care of Dr. Deeann Fare.  She just finished physical therapy.  History of IBS  History of hypertension-blood pressure was normal at 134/79.  Orders: Orders Placed This Encounter  Procedures   Protein / creatinine ratio, urine   CBC with Differential/Platelet   Comprehensive metabolic panel with GFR   Anti-DNA antibody, double-stranded   C3 and C4   Sedimentation rate   ANA   RNP Antibody   Anti-Smith antibody   Sjogrens syndrome-A extractable nuclear antibody   Meds ordered this encounter  Medications   hydroxychloroquine  (PLAQUENIL ) 200 MG tablet    Sig: Take 1 tablet (200 mg total) by mouth every other day.    Dispense:  45 tablet    Refill:  0     Follow-Up Instructions: Return in about 5 months (around 04/12/2024) for Systemic lupus, Osteoarthritis.   Nicholas Bari, MD  Note - This record has been created using Animal nutritionist.  Chart creation errors have been sought, but may not always  have been located. Such  creation errors do not reflect on  the standard of medical care.

## 2023-11-04 DIAGNOSIS — Z23 Encounter for immunization: Secondary | ICD-10-CM | POA: Diagnosis not present

## 2023-11-04 DIAGNOSIS — I10 Essential (primary) hypertension: Secondary | ICD-10-CM | POA: Diagnosis not present

## 2023-11-04 DIAGNOSIS — R7989 Other specified abnormal findings of blood chemistry: Secondary | ICD-10-CM | POA: Diagnosis not present

## 2023-11-04 DIAGNOSIS — E7800 Pure hypercholesterolemia, unspecified: Secondary | ICD-10-CM | POA: Diagnosis not present

## 2023-11-04 DIAGNOSIS — R7303 Prediabetes: Secondary | ICD-10-CM | POA: Diagnosis not present

## 2023-11-04 DIAGNOSIS — I2510 Atherosclerotic heart disease of native coronary artery without angina pectoris: Secondary | ICD-10-CM | POA: Diagnosis not present

## 2023-11-04 DIAGNOSIS — M329 Systemic lupus erythematosus, unspecified: Secondary | ICD-10-CM | POA: Diagnosis not present

## 2023-11-04 DIAGNOSIS — E349 Endocrine disorder, unspecified: Secondary | ICD-10-CM | POA: Diagnosis not present

## 2023-11-04 DIAGNOSIS — M810 Age-related osteoporosis without current pathological fracture: Secondary | ICD-10-CM | POA: Diagnosis not present

## 2023-11-04 DIAGNOSIS — G4700 Insomnia, unspecified: Secondary | ICD-10-CM | POA: Diagnosis not present

## 2023-11-04 DIAGNOSIS — Z1331 Encounter for screening for depression: Secondary | ICD-10-CM | POA: Diagnosis not present

## 2023-11-04 DIAGNOSIS — I7300 Raynaud's syndrome without gangrene: Secondary | ICD-10-CM | POA: Diagnosis not present

## 2023-11-04 DIAGNOSIS — Z0000 Encounter for general adult medical examination without abnormal findings: Secondary | ICD-10-CM | POA: Diagnosis not present

## 2023-11-05 ENCOUNTER — Encounter: Payer: Self-pay | Admitting: Obstetrics and Gynecology

## 2023-11-06 ENCOUNTER — Encounter: Payer: Self-pay | Admitting: Obstetrics and Gynecology

## 2023-11-10 NOTE — Progress Notes (Signed)
 T-score -1.4.  I will discuss results at the follow-up visit.

## 2023-11-11 ENCOUNTER — Encounter: Payer: Self-pay | Admitting: Rheumatology

## 2023-11-11 ENCOUNTER — Ambulatory Visit: Payer: Medicare Other | Attending: Rheumatology | Admitting: Rheumatology

## 2023-11-11 VITALS — BP 134/79 | HR 65 | Resp 14 | Ht 64.25 in | Wt 150.0 lb

## 2023-11-11 DIAGNOSIS — Z8719 Personal history of other diseases of the digestive system: Secondary | ICD-10-CM

## 2023-11-11 DIAGNOSIS — M25642 Stiffness of left hand, not elsewhere classified: Secondary | ICD-10-CM | POA: Insufficient documentation

## 2023-11-11 DIAGNOSIS — E559 Vitamin D deficiency, unspecified: Secondary | ICD-10-CM

## 2023-11-11 DIAGNOSIS — M170 Bilateral primary osteoarthritis of knee: Secondary | ICD-10-CM

## 2023-11-11 DIAGNOSIS — M25612 Stiffness of left shoulder, not elsewhere classified: Secondary | ICD-10-CM | POA: Diagnosis not present

## 2023-11-11 DIAGNOSIS — M3219 Other organ or system involvement in systemic lupus erythematosus: Secondary | ICD-10-CM

## 2023-11-11 DIAGNOSIS — M51369 Other intervertebral disc degeneration, lumbar region without mention of lumbar back pain or lower extremity pain: Secondary | ICD-10-CM

## 2023-11-11 DIAGNOSIS — Z8679 Personal history of other diseases of the circulatory system: Secondary | ICD-10-CM | POA: Diagnosis not present

## 2023-11-11 DIAGNOSIS — S42292D Other displaced fracture of upper end of left humerus, subsequent encounter for fracture with routine healing: Secondary | ICD-10-CM | POA: Diagnosis not present

## 2023-11-11 DIAGNOSIS — M25641 Stiffness of right hand, not elsewhere classified: Secondary | ICD-10-CM | POA: Diagnosis not present

## 2023-11-11 DIAGNOSIS — I7300 Raynaud's syndrome without gangrene: Secondary | ICD-10-CM

## 2023-11-11 DIAGNOSIS — Z79899 Other long term (current) drug therapy: Secondary | ICD-10-CM | POA: Diagnosis not present

## 2023-11-11 DIAGNOSIS — M810 Age-related osteoporosis without current pathological fracture: Secondary | ICD-10-CM

## 2023-11-11 DIAGNOSIS — Z8781 Personal history of (healed) traumatic fracture: Secondary | ICD-10-CM | POA: Diagnosis not present

## 2023-11-11 DIAGNOSIS — L930 Discoid lupus erythematosus: Secondary | ICD-10-CM

## 2023-11-11 MED ORDER — HYDROXYCHLOROQUINE SULFATE 200 MG PO TABS: 200.0000 mg | ORAL_TABLET | ORAL | 0 refills | Status: DC

## 2023-11-11 NOTE — Patient Instructions (Signed)
 Vaccines You are taking a medication(s) that can suppress your immune system.  The following immunizations are recommended: Flu annually Covid-19  Td/Tdap (tetanus, diphtheria, pertussis) every 10 years Pneumonia (Prevnar 15 then Pneumovax 23 at least 1 year apart.  Alternatively, can take Prevnar 20 without needing additional dose) Shingrix: 2 doses from 4 weeks to 6 months apart  Please check with your PCP to make sure you are up to date.

## 2023-11-25 DIAGNOSIS — H52203 Unspecified astigmatism, bilateral: Secondary | ICD-10-CM | POA: Diagnosis not present

## 2023-11-25 DIAGNOSIS — Z79899 Other long term (current) drug therapy: Secondary | ICD-10-CM | POA: Diagnosis not present

## 2023-11-25 DIAGNOSIS — E119 Type 2 diabetes mellitus without complications: Secondary | ICD-10-CM | POA: Diagnosis not present

## 2023-11-25 DIAGNOSIS — H5213 Myopia, bilateral: Secondary | ICD-10-CM | POA: Diagnosis not present

## 2024-01-06 DIAGNOSIS — L603 Nail dystrophy: Secondary | ICD-10-CM | POA: Diagnosis not present

## 2024-01-06 DIAGNOSIS — D2371 Other benign neoplasm of skin of right lower limb, including hip: Secondary | ICD-10-CM | POA: Diagnosis not present

## 2024-01-06 DIAGNOSIS — M19071 Primary osteoarthritis, right ankle and foot: Secondary | ICD-10-CM | POA: Diagnosis not present

## 2024-01-06 DIAGNOSIS — M19072 Primary osteoarthritis, left ankle and foot: Secondary | ICD-10-CM | POA: Diagnosis not present

## 2024-01-13 DIAGNOSIS — M25612 Stiffness of left shoulder, not elsewhere classified: Secondary | ICD-10-CM | POA: Diagnosis not present

## 2024-02-12 DIAGNOSIS — D2371 Other benign neoplasm of skin of right lower limb, including hip: Secondary | ICD-10-CM | POA: Diagnosis not present

## 2024-03-03 ENCOUNTER — Other Ambulatory Visit: Payer: Self-pay | Admitting: Rheumatology

## 2024-03-03 DIAGNOSIS — M3219 Other organ or system involvement in systemic lupus erythematosus: Secondary | ICD-10-CM

## 2024-03-03 NOTE — Telephone Encounter (Signed)
 Last Fill: 11/11/2023  Eye exam: 11/25/2023 WNL   Labs: 09/02/2023 CBC WNL  CMP WNL  Urine protein creatinine ratio WNL  Complements WNL  ESR WNL   Next Visit: 04/18/2024  Last Visit: 11/11/2023  DX: Other systemic lupus erythematosus with other organ involvement   Current Dose per office note 11/11/2023: Plaquenil  200 mg 1 tablet by mouth every other day   Okay to refill Plaquenil ?

## 2024-03-09 ENCOUNTER — Telehealth: Payer: Self-pay

## 2024-03-09 ENCOUNTER — Other Ambulatory Visit: Payer: Self-pay | Admitting: *Deleted

## 2024-03-09 DIAGNOSIS — M3219 Other organ or system involvement in systemic lupus erythematosus: Secondary | ICD-10-CM

## 2024-03-09 DIAGNOSIS — Z79899 Other long term (current) drug therapy: Secondary | ICD-10-CM

## 2024-03-09 NOTE — Telephone Encounter (Signed)
 Patient plans to stop by the office to update labs today. Advised the patient there are orders pending and they will be released when she gets here.

## 2024-03-11 ENCOUNTER — Ambulatory Visit: Payer: Self-pay | Admitting: Rheumatology

## 2024-03-11 LAB — CBC WITH DIFFERENTIAL/PLATELET
Absolute Lymphocytes: 1488 {cells}/uL (ref 850–3900)
Absolute Monocytes: 546 {cells}/uL (ref 200–950)
Basophils Absolute: 60 {cells}/uL (ref 0–200)
Basophils Relative: 1 %
Eosinophils Absolute: 210 {cells}/uL (ref 15–500)
Eosinophils Relative: 3.5 %
HCT: 40.7 % (ref 35.0–45.0)
Hemoglobin: 13.3 g/dL (ref 11.7–15.5)
MCH: 28.7 pg (ref 27.0–33.0)
MCHC: 32.7 g/dL (ref 32.0–36.0)
MCV: 87.9 fL (ref 80.0–100.0)
MPV: 9.9 fL (ref 7.5–12.5)
Monocytes Relative: 9.1 %
Neutro Abs: 3696 {cells}/uL (ref 1500–7800)
Neutrophils Relative %: 61.6 %
Platelets: 279 Thousand/uL (ref 140–400)
RBC: 4.63 Million/uL (ref 3.80–5.10)
RDW: 12.9 % (ref 11.0–15.0)
Total Lymphocyte: 24.8 %
WBC: 6 Thousand/uL (ref 3.8–10.8)

## 2024-03-11 LAB — ANTI-NUCLEAR AB-TITER (ANA TITER)
ANA TITER: 1:40 {titer} — ABNORMAL HIGH
ANA Titer 1: 1:40 {titer} — ABNORMAL HIGH

## 2024-03-11 LAB — ANTI-SMITH ANTIBODY: ENA SM Ab Ser-aCnc: <1 AI

## 2024-03-11 LAB — COMPREHENSIVE METABOLIC PANEL WITH GFR
AG Ratio: 1.9 (calc) (ref 1.0–2.5)
ALT: 29 U/L (ref 6–29)
AST: 17 U/L (ref 10–35)
Albumin: 4.8 g/dL (ref 3.6–5.1)
Alkaline phosphatase (APISO): 61 U/L (ref 37–153)
BUN: 21 mg/dL (ref 7–25)
CO2: 28 mmol/L (ref 20–32)
Calcium: 9.9 mg/dL (ref 8.6–10.4)
Chloride: 98 mmol/L (ref 98–110)
Creat: 0.85 mg/dL (ref 0.60–1.00)
Globulin: 2.5 g/dL (ref 1.9–3.7)
Glucose, Bld: 81 mg/dL (ref 65–99)
Potassium: 4.3 mmol/L (ref 3.5–5.3)
Sodium: 138 mmol/L (ref 135–146)
Total Bilirubin: 0.8 mg/dL (ref 0.2–1.2)
Total Protein: 7.3 g/dL (ref 6.1–8.1)
eGFR: 72 mL/min/{1.73_m2}

## 2024-03-11 LAB — SJOGRENS SYNDROME-A EXTRACTABLE NUCLEAR ANTIBODY: SSA (Ro) (ENA) Antibody, IgG: 2.3 AI — AB

## 2024-03-11 LAB — C3 AND C4
C3 Complement: 124 mg/dL (ref 83–193)
C4 Complement: 22 mg/dL (ref 15–57)

## 2024-03-11 LAB — PROTEIN / CREATININE RATIO, URINE
Creatinine, Urine: 23 mg/dL (ref 20–275)
Total Protein, Urine: <4 mg/dL — ABNORMAL LOW (ref 5–24)

## 2024-03-11 LAB — RNP ANTIBODY: Ribonucleic Protein(ENA) Antibody, IgG: <1 AI

## 2024-03-11 LAB — ANTI-DNA ANTIBODY, DOUBLE-STRANDED: ds DNA Ab: <1 [IU]/mL

## 2024-03-11 LAB — SEDIMENTATION RATE: Sed Rate: 2 mm/h (ref 0–30)

## 2024-03-11 LAB — ANA: Anti Nuclear Antibody (ANA): POSITIVE — AB

## 2024-03-11 NOTE — Progress Notes (Signed)
 CBC and CMP normal, SSA antibody remains positive, urine protein creatinine ratio normal, Smith and RNP negative, double-stranded DNA negative, sed rate normal, complements normal.  Labs do not indicate an autoimmune disease flare.

## 2024-03-13 NOTE — Progress Notes (Signed)
 ANA and SSA antibody remain positive.  All other labs are within normal limits.  Labs do not indicate an autoimmune disease flare.  No change in treatment advised.

## 2024-03-23 DIAGNOSIS — H6123 Impacted cerumen, bilateral: Secondary | ICD-10-CM | POA: Diagnosis not present

## 2024-03-23 DIAGNOSIS — Z974 Presence of external hearing-aid: Secondary | ICD-10-CM | POA: Diagnosis not present

## 2024-03-30 DIAGNOSIS — Z23 Encounter for immunization: Secondary | ICD-10-CM | POA: Diagnosis not present

## 2024-04-05 NOTE — Progress Notes (Deleted)
 Office Visit Note  Patient: Theresa Graham             Date of Birth: Mar 09, 1951           MRN: [id number]             PCP: Claudene Pellet, MD Referring: Claudene Pellet, MD Visit Date: 04/18/2024 Occupation: Data Unavailable  Subjective:  No chief complaint on file.   History of Present Illness: Theresa Graham is a 73 y.o. female ***     Activities of Daily Living:  Patient reports morning stiffness for *** {minute/hour:19697}.   Patient {ACTIONS;DENIES/REPORTS:21021675::Denies} nocturnal pain.  Difficulty dressing/grooming: {ACTIONS;DENIES/REPORTS:21021675::Denies} Difficulty climbing stairs: {ACTIONS;DENIES/REPORTS:21021675::Denies} Difficulty getting out of chair: {ACTIONS;DENIES/REPORTS:21021675::Denies} Difficulty using hands for taps, buttons, cutlery, and/or writing: {ACTIONS;DENIES/REPORTS:21021675::Denies}  No Rheumatology ROS completed.   PMFS History:  Patient Active Problem List   Diagnosis Date Noted  . Family history of ulcerative colitis 09/17/2022  . Coronary artery calcification 09/24/2021  . Bilateral impacted cerumen 05/13/2021  . Chronic sinusitis 05/13/2021  . Bulging of intervertebral disc between L4 and L5 12/20/2019  . Sensorineural hearing loss (SNHL), bilateral 01/24/2019  . Trochanteric bursitis of left hip 01/08/2017  . 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 Age-related osteoporosis without current pathological fracture 08/04/2016  . Systemic lupus erythematosus (HCC) 08/01/2016  . Vitamin D  deficiency 08/01/2016  . History of IBS 08/01/2016  . Plantar fasciitis 08/01/2016  . Raynaud's disease without gangrene 08/01/2016  . History of hypertension 08/01/2016  . High risk medication use 06/18/2016  . Hypertension   . HSV (herpes simplex virus) infection   . Headache     Past Medical History:  Diagnosis Date  . HSV (herpes simplex virus) infection   . Hydrosalpinx   . Hypertension   . IBS (irritable bowel syndrome)   . Insomnia   .  Lupus    sle and discoid lupus  . Lupus   . Osteoporosis 2019   T score -2.8 statistically improved from prior study  . Prediabetes   . Raynaud's disease   . Spinal stenosis    per patient   . Systemic lupus erythematosus (HCC)     Family History  Problem Relation Age of Onset  . Cancer Mother   . Diabetes Mother   . Hypertension Mother   . Thyroid  disease Mother   . Heart disease Mother   . Breast cancer Mother        Age 54  . Diabetes Father   . Hypertension Father   . Cancer Father        COLON  . Heart disease Father   . Crohn's disease Sister   . Hypertension Sister   . Stroke Sister   . Cancer Sister        Intestinal Ca  . Colitis Sister   . Sarcoidosis Sister   . Cancer Sister   . Crohn's disease Sister   . Hypertension Brother   . Hypertension Brother   . Paget's disease of bone Brother   . Hypertension Maternal Grandmother   . Diabetes Maternal Grandmother   . Hypertension Paternal Grandmother   . Diabetes Paternal Grandmother   . Diabetes Paternal Grandfather   . Healthy Son   . Rheum arthritis Son    Past Surgical History:  Procedure Laterality Date  . ABDOMINAL HYSTERECTOMY  1987   TAH  . back injection  05/2020  . FOOT SURGERY Left 2010  . hysterectomy  1987  . ROTATOR CUFF REPAIR  2006  .  SHOULDER SURGERY FOR BONE SPUR  2010  . STRESS FRACTURE LEG  08/2007  . TENDON RELEASE Left 01/07/2018  . TUBAL LIGATION  1978  . WRIST SURGERY Right 09/23/2018   tendon release   . WRIST SURGERY Left 12/2017   Social History   Tobacco Use  . Smoking status: Never    Passive exposure: Never  . Smokeless tobacco: Never  Vaping Use  . Vaping status: Never Used  Substance Use Topics  . Alcohol  use: Yes    Comment: occ  . Drug use: No   Social History   Social History Narrative  . Not on file     Immunization History  Administered Date(s) Administered  . INFLUENZA, HIGH DOSE SEASONAL PF 04/25/2017, 04/19/2018, 03/09/2019  . Influenza  Split 05/05/2012  . Influenza,inj,Quad PF,6+ Mos 05/13/2013, 05/17/2014  . Influenza-Unspecified 04/25/2017  . PFIZER(Purple Top)SARS-COV-2 Vaccination 08/16/2019, 09/06/2019, 03/23/2020, 10/22/2020  . Zoster Recombinant(Shingrix) 05/27/2017     Objective: Vital Signs: There were no vitals taken for this visit.   Physical Exam   Musculoskeletal Exam: ***  CDAI Exam: CDAI Score: -- Patient Global: --; Provider Global: -- Swollen: --; Tender: -- Joint Exam 04/18/2024   No joint exam has been documented for this visit   There is currently no information documented on the homunculus. Go to the Rheumatology activity and complete the homunculus joint exam.  Investigation: No additional findings.  Imaging: No results found.  Recent Labs: Lab Results  Component Value Date   WBC 6.0 03/09/2024   HGB 13.3 03/09/2024   PLT 279 03/09/2024   NA 138 03/09/2024   K 4.3 03/09/2024   CL 98 03/09/2024   CO2 28 03/09/2024   GLUCOSE 81 03/09/2024   BUN 21 03/09/2024   CREATININE 0.85 03/09/2024   BILITOT 0.8 03/09/2024   ALKPHOS 69 08/04/2016   AST 17 03/09/2024   ALT 29 03/09/2024   PROT 7.3 03/09/2024   ALBUMIN 4.3 08/04/2016   CALCIUM  9.9 03/09/2024   GFRAA 70 05/31/2020    Speciality Comments: PLQ Eye Exam: 11/25/2023 WNL @ [facility] Ophthalmology follow up in 1 year Fosamax  07/2016    Procedures:  No procedures performed Allergies: Latex   Assessment / Plan:     Visit Diagnoses: Other systemic lupus erythematosus with other organ involvement (HCC)  High risk medication use  Discoid lupus  Stiffness of joints of both hands  Raynaud's syndrome without gangrene  Primary osteoarthritis of both knees  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  No orders of the defined types were placed in this encounter.   Face-to-face time spent with patient was *** minutes. Greater than 50% of time was spent in counseling and coordination of care.  Follow-Up Instructions: No follow-ups on file.   Waddell CHRISTELLA Craze, PA-C  Note - This record has been created using Dragon software.  Chart creation errors have been sought, but may not always  have been located. Such creation errors do not reflect on  the standard of medical care.

## 2024-04-13 DIAGNOSIS — M25612 Stiffness of left shoulder, not elsewhere classified: Secondary | ICD-10-CM | POA: Diagnosis not present

## 2024-04-14 DIAGNOSIS — Z800 Family history of malignant neoplasm of digestive organs: Secondary | ICD-10-CM | POA: Diagnosis not present

## 2024-04-14 DIAGNOSIS — R140 Abdominal distension (gaseous): Secondary | ICD-10-CM | POA: Diagnosis not present

## 2024-04-14 DIAGNOSIS — K5730 Diverticulosis of large intestine without perforation or abscess without bleeding: Secondary | ICD-10-CM | POA: Diagnosis not present

## 2024-04-14 DIAGNOSIS — K5904 Chronic idiopathic constipation: Secondary | ICD-10-CM | POA: Diagnosis not present

## 2024-04-15 DIAGNOSIS — Z23 Encounter for immunization: Secondary | ICD-10-CM | POA: Diagnosis not present

## 2024-04-18 ENCOUNTER — Ambulatory Visit: Admitting: Physician Assistant

## 2024-04-18 DIAGNOSIS — M170 Bilateral primary osteoarthritis of knee: Secondary | ICD-10-CM

## 2024-04-18 DIAGNOSIS — M810 Age-related osteoporosis without current pathological fracture: Secondary | ICD-10-CM

## 2024-04-18 DIAGNOSIS — M3219 Other organ or system involvement in systemic lupus erythematosus: Secondary | ICD-10-CM

## 2024-04-18 DIAGNOSIS — L930 Discoid lupus erythematosus: Secondary | ICD-10-CM

## 2024-04-18 DIAGNOSIS — Z79899 Other long term (current) drug therapy: Secondary | ICD-10-CM

## 2024-04-18 DIAGNOSIS — M25641 Stiffness of right hand, not elsewhere classified: Secondary | ICD-10-CM

## 2024-04-18 DIAGNOSIS — I7300 Raynaud's syndrome without gangrene: Secondary | ICD-10-CM

## 2024-04-18 DIAGNOSIS — Z8679 Personal history of other diseases of the circulatory system: Secondary | ICD-10-CM

## 2024-04-18 DIAGNOSIS — M51369 Other intervertebral disc degeneration, lumbar region without mention of lumbar back pain or lower extremity pain: Secondary | ICD-10-CM

## 2024-04-18 DIAGNOSIS — Z8781 Personal history of (healed) traumatic fracture: Secondary | ICD-10-CM

## 2024-04-18 DIAGNOSIS — E559 Vitamin D deficiency, unspecified: Secondary | ICD-10-CM

## 2024-04-18 DIAGNOSIS — Z8719 Personal history of other diseases of the digestive system: Secondary | ICD-10-CM

## 2024-04-19 NOTE — Progress Notes (Unsigned)
 Office Visit Note  Patient: Theresa Graham             Date of Birth: [birth year]           MRN: [id number]             PCP: Claudene Pellet, MD Referring: Claudene Pellet, MD Visit Date: 04/20/2024 Occupation: Data Unavailable  Subjective:  No chief complaint on file.   History of Present Illness: Theresa Graham is a 73 y.o. female ***     Activities of Daily Living:  Patient reports morning stiffness for *** {minute/hour:19697}.   Patient {ACTIONS;DENIES/REPORTS:21021675::Denies} nocturnal pain.  Difficulty dressing/grooming: {ACTIONS;DENIES/REPORTS:21021675::Denies} Difficulty climbing stairs: {ACTIONS;DENIES/REPORTS:21021675::Denies} Difficulty getting out of chair: {ACTIONS;DENIES/REPORTS:21021675::Denies} Difficulty using hands for taps, buttons, cutlery, and/or writing: {ACTIONS;DENIES/REPORTS:21021675::Denies}  No Rheumatology ROS completed.   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   Age-related osteoporosis without current pathological fracture 08/04/2016   Systemic lupus erythematosus (HCC) 08/01/2016   Vitamin D  deficiency 08/01/2016   History of IBS 08/01/2016   Plantar fasciitis 08/01/2016   Raynaud's disease without gangrene 08/01/2016   History of hypertension 08/01/2016   High risk medication use 06/18/2016   Hypertension    HSV (herpes simplex virus) infection    Headache     Past Medical History:  Diagnosis Date   HSV (herpes simplex virus) infection    Hydrosalpinx    Hypertension    IBS (irritable bowel syndrome)    Insomnia    Lupus    sle and discoid  lupus   Lupus    Osteoporosis 2019   T score -2.8 statistically improved from prior study   Prediabetes    Raynaud's disease    Spinal stenosis    per patient    Systemic lupus erythematosus (HCC)     Family History  Problem Relation Age of Onset   Cancer Mother    Diabetes Mother    Hypertension Mother    Thyroid  disease Mother    Heart disease Mother    Breast cancer Mother        Age 13   Diabetes Father    Hypertension Father    Cancer Father        COLON   Heart disease Father    Crohn's disease Sister    Hypertension Sister    Stroke Sister    Cancer Sister        Intestinal Ca   Colitis Sister    Sarcoidosis Sister    Cancer Sister    Crohn's disease Sister    Hypertension Brother    Hypertension Brother    Paget's disease of bone Brother    Hypertension Maternal Grandmother    Diabetes Maternal Grandmother    Hypertension Paternal Grandmother    Diabetes Paternal Grandmother    Diabetes Paternal Grandfather    Healthy Son    Rheum arthritis Son    Past Surgical History:  Procedure Laterality Date   ABDOMINAL HYSTERECTOMY  1987   TAH   back injection  05/2020   FOOT SURGERY Left 2010   hysterectomy  1987   ROTATOR CUFF REPAIR  2006  SHOULDER SURGERY FOR BONE SPUR  2010   STRESS FRACTURE LEG  08/2007   TENDON RELEASE Left 01/07/2018   TUBAL LIGATION  1978   WRIST SURGERY Right 09/23/2018   tendon release    WRIST SURGERY Left 12/2017   Social History   Tobacco Use   Smoking status: Never    Passive exposure: Never   Smokeless tobacco: Never  Vaping Use   Vaping status: Never Used  Substance Use Topics   Alcohol  use: Yes    Comment: occ   Drug use: No   Social History   Social History Narrative   Not on file     Immunization History  Administered Date(s) Administered   INFLUENZA, HIGH DOSE SEASONAL PF 04/25/2017, 04/19/2018, 03/09/2019   Influenza Split 05/05/2012   Influenza,inj,Quad PF,6+ Mos 05/13/2013, 05/17/2014    Influenza-Unspecified 04/25/2017   PFIZER(Purple Top)SARS-COV-2 Vaccination 08/16/2019, 09/06/2019, 03/23/2020, 10/22/2020   Zoster Recombinant(Shingrix) 05/27/2017     Objective: Vital Signs: There were no vitals taken for this visit.   Physical Exam   Musculoskeletal Exam: ***  CDAI Exam: CDAI Score: -- Patient Global: --; Provider Global: -- Swollen: --; Tender: -- Joint Exam 04/20/2024   No joint exam has been documented for this visit   There is currently no information documented on the homunculus. Go to the Rheumatology activity and complete the homunculus joint exam.  Investigation: No additional findings.  Imaging: No results found.  Recent Labs: Lab Results  Component Value Date   WBC 6.0 03/09/2024   HGB 13.3 03/09/2024   PLT 279 03/09/2024   NA 138 03/09/2024   K 4.3 03/09/2024   CL 98 03/09/2024   CO2 28 03/09/2024   GLUCOSE 81 03/09/2024   BUN 21 03/09/2024   CREATININE 0.85 03/09/2024   BILITOT 0.8 03/09/2024   ALKPHOS 69 08/04/2016   AST 17 03/09/2024   ALT 29 03/09/2024   PROT 7.3 03/09/2024   ALBUMIN 4.3 08/04/2016   CALCIUM  9.9 03/09/2024   GFRAA 70 05/31/2020    Speciality Comments: PLQ Eye Exam: 11/25/2023 WNL @ [facility] Ophthalmology follow up in 1 year Fosamax  07/2016    Procedures:  No procedures performed Allergies: Latex   Assessment / Plan:     Visit Diagnoses: No diagnosis found.  Orders: No orders of the defined types were placed in this encounter.  No orders of the defined types were placed in this encounter.   Face-to-face time spent with patient was *** minutes. Greater than 50% of time was spent in counseling and coordination of care.  Follow-Up Instructions: No follow-ups on file.   Shelba SHAUNNA Potters, RT  Note - This record has been created using [employer].  Chart creation errors have been sought, but may not always  have been located. Such creation errors do not reflect on  the standard of medical  care.

## 2024-04-20 ENCOUNTER — Encounter: Payer: Self-pay | Admitting: Physician Assistant

## 2024-04-20 ENCOUNTER — Ambulatory Visit: Attending: Physician Assistant | Admitting: Physician Assistant

## 2024-04-20 VITALS — BP 118/69 | HR 61 | Temp 97.6°F | Resp 12 | Ht 64.25 in | Wt 139.2 lb

## 2024-04-20 DIAGNOSIS — M810 Age-related osteoporosis without current pathological fracture: Secondary | ICD-10-CM | POA: Insufficient documentation

## 2024-04-20 DIAGNOSIS — I7300 Raynaud's syndrome without gangrene: Secondary | ICD-10-CM | POA: Insufficient documentation

## 2024-04-20 DIAGNOSIS — M25641 Stiffness of right hand, not elsewhere classified: Secondary | ICD-10-CM | POA: Insufficient documentation

## 2024-04-20 DIAGNOSIS — E559 Vitamin D deficiency, unspecified: Secondary | ICD-10-CM | POA: Diagnosis not present

## 2024-04-20 DIAGNOSIS — Z8781 Personal history of (healed) traumatic fracture: Secondary | ICD-10-CM | POA: Diagnosis not present

## 2024-04-20 DIAGNOSIS — M51369 Other intervertebral disc degeneration, lumbar region without mention of lumbar back pain or lower extremity pain: Secondary | ICD-10-CM | POA: Diagnosis not present

## 2024-04-20 DIAGNOSIS — Z8679 Personal history of other diseases of the circulatory system: Secondary | ICD-10-CM | POA: Diagnosis not present

## 2024-04-20 DIAGNOSIS — M3219 Other organ or system involvement in systemic lupus erythematosus: Secondary | ICD-10-CM | POA: Insufficient documentation

## 2024-04-20 DIAGNOSIS — M25642 Stiffness of left hand, not elsewhere classified: Secondary | ICD-10-CM | POA: Diagnosis not present

## 2024-04-20 DIAGNOSIS — Z8719 Personal history of other diseases of the digestive system: Secondary | ICD-10-CM | POA: Diagnosis not present

## 2024-04-20 DIAGNOSIS — L930 Discoid lupus erythematosus: Secondary | ICD-10-CM | POA: Diagnosis not present

## 2024-04-20 DIAGNOSIS — Z79899 Other long term (current) drug therapy: Secondary | ICD-10-CM | POA: Diagnosis not present

## 2024-04-20 DIAGNOSIS — M170 Bilateral primary osteoarthritis of knee: Secondary | ICD-10-CM | POA: Insufficient documentation

## 2024-04-20 MED ORDER — HYDROXYCHLOROQUINE SULFATE 200 MG PO TABS: 200.0000 mg | ORAL_TABLET | ORAL | 0 refills | Status: DC

## 2024-04-20 NOTE — Patient Instructions (Signed)

## 2024-05-02 DIAGNOSIS — E119 Type 2 diabetes mellitus without complications: Secondary | ICD-10-CM | POA: Diagnosis not present

## 2024-05-02 DIAGNOSIS — I10 Essential (primary) hypertension: Secondary | ICD-10-CM | POA: Diagnosis not present

## 2024-05-02 DIAGNOSIS — E7800 Pure hypercholesterolemia, unspecified: Secondary | ICD-10-CM | POA: Diagnosis not present

## 2024-05-02 DIAGNOSIS — I2510 Atherosclerotic heart disease of native coronary artery without angina pectoris: Secondary | ICD-10-CM | POA: Diagnosis not present

## 2024-05-02 DIAGNOSIS — M329 Systemic lupus erythematosus, unspecified: Secondary | ICD-10-CM | POA: Diagnosis not present

## 2024-05-02 DIAGNOSIS — Z8739 Personal history of other diseases of the musculoskeletal system and connective tissue: Secondary | ICD-10-CM | POA: Diagnosis not present

## 2024-05-04 DIAGNOSIS — D225 Melanocytic nevi of trunk: Secondary | ICD-10-CM | POA: Diagnosis not present

## 2024-05-04 DIAGNOSIS — L819 Disorder of pigmentation, unspecified: Secondary | ICD-10-CM | POA: Diagnosis not present

## 2024-05-04 DIAGNOSIS — L905 Scar conditions and fibrosis of skin: Secondary | ICD-10-CM | POA: Diagnosis not present

## 2024-05-04 DIAGNOSIS — Z85828 Personal history of other malignant neoplasm of skin: Secondary | ICD-10-CM | POA: Diagnosis not present

## 2024-05-04 DIAGNOSIS — L821 Other seborrheic keratosis: Secondary | ICD-10-CM | POA: Diagnosis not present

## 2024-05-23 DIAGNOSIS — E871 Hypo-osmolality and hyponatremia: Secondary | ICD-10-CM | POA: Diagnosis not present

## 2024-08-09 ENCOUNTER — Other Ambulatory Visit: Payer: Self-pay | Admitting: Physician Assistant

## 2024-08-09 DIAGNOSIS — M3219 Other organ or system involvement in systemic lupus erythematosus: Secondary | ICD-10-CM

## 2024-08-10 ENCOUNTER — Other Ambulatory Visit: Payer: Self-pay | Admitting: *Deleted

## 2024-08-10 ENCOUNTER — Other Ambulatory Visit: Payer: Self-pay

## 2024-08-10 DIAGNOSIS — I7300 Raynaud's syndrome without gangrene: Secondary | ICD-10-CM

## 2024-08-10 DIAGNOSIS — M3219 Other organ or system involvement in systemic lupus erythematosus: Secondary | ICD-10-CM

## 2024-08-10 DIAGNOSIS — L930 Discoid lupus erythematosus: Secondary | ICD-10-CM

## 2024-08-10 DIAGNOSIS — Z79899 Other long term (current) drug therapy: Secondary | ICD-10-CM

## 2024-08-10 MED ORDER — HYDROXYCHLOROQUINE SULFATE 200 MG PO TABS: 200.0000 mg | ORAL_TABLET | ORAL | 0 refills | Status: AC

## 2024-08-10 NOTE — Telephone Encounter (Signed)
 Last Fill: 04/20/2024  Eye exam: 11/25/2023   Labs: 03/09/2024 CBC and CMP normal, SSA antibody remains positive, urine protein creatinine ratio normal, Smith and RNP negative, double-stranded DNA negative, sed rate normal, complements normal. Labs do not indicate an autoimmune disease flare.   Next Visit: 09/28/2024  Last Visit: 04/20/2024  IK:Nuyzm systemic lupus erythematosus with other organ involvement   Current Dose per office note on 04/20/2024: Plaquenil  200 mg 1 tablet by mouth every other day.   Okay to refill Plaquenil ?

## 2024-08-10 NOTE — Telephone Encounter (Signed)
 Patient coming for labs today.  Last Fill: 04/20/2024   Eye exam: 11/25/2023    Labs: 03/09/2024 CBC and CMP normal, SSA antibody remains positive, urine protein creatinine ratio normal, Smith and RNP negative, double-stranded DNA negative, sed rate normal, complements normal. Labs do not indicate an autoimmune disease flare.    Next Visit: 09/28/2024   Last Visit: 04/20/2024   IK:Nuyzm systemic lupus erythematosus with other organ involvement    Current Dose per office note on 04/20/2024: Plaquenil  200 mg 1 tablet by mouth every other day.    Okay to refill Plaquenil ?

## 2024-08-10 NOTE — Telephone Encounter (Signed)
 Patient advised she is due to update her lab work. Patient states she has enough for now and would like us  to wait to send until she updates her labs and can have a 90 day supply

## 2024-08-11 ENCOUNTER — Ambulatory Visit: Payer: Self-pay | Admitting: Rheumatology

## 2024-08-11 NOTE — Progress Notes (Signed)
 CBC and CMP are normal, sed rate normal, complements normal double-stranded DNA pending.  Labs do not indicate an autoimmune disease flare.

## 2024-08-12 LAB — CBC WITH DIFFERENTIAL/PLATELET
Absolute Lymphocytes: 1944 {cells}/uL (ref 850–3900)
Absolute Monocytes: 728 {cells}/uL (ref 200–950)
Basophils Absolute: 72 {cells}/uL (ref 0–200)
Basophils Relative: 0.9 %
Eosinophils Absolute: 312 {cells}/uL (ref 15–500)
Eosinophils Relative: 3.9 %
HCT: 37.2 % (ref 35.9–46.0)
Hemoglobin: 12.3 g/dL (ref 11.7–15.5)
MCH: 27.9 pg (ref 27.0–33.0)
MCHC: 33.1 g/dL (ref 31.6–35.4)
MCV: 84.4 fL (ref 81.4–101.7)
MPV: 9.8 fL (ref 7.5–12.5)
Monocytes Relative: 9.1 %
Neutro Abs: 4944 {cells}/uL (ref 1500–7800)
Neutrophils Relative %: 61.8 %
Platelets: 414 Thousand/uL — ABNORMAL HIGH (ref 140–400)
RBC: 4.41 Million/uL (ref 3.80–5.10)
RDW: 12.2 % (ref 11.0–15.0)
Total Lymphocyte: 24.3 %
WBC: 8 Thousand/uL (ref 3.8–10.8)

## 2024-08-12 LAB — COMPREHENSIVE METABOLIC PANEL WITH GFR
AG Ratio: 1.7 (calc) (ref 1.0–2.5)
ALT: 19 U/L (ref 6–29)
AST: 11 U/L (ref 10–35)
Albumin: 4.5 g/dL (ref 3.6–5.1)
Alkaline phosphatase (APISO): 73 U/L (ref 37–153)
BUN: 19 mg/dL (ref 7–25)
CO2: 31 mmol/L (ref 20–32)
Calcium: 10 mg/dL (ref 8.6–10.4)
Chloride: 99 mmol/L (ref 98–110)
Creat: 0.9 mg/dL (ref 0.60–1.00)
Globulin: 2.7 g/dL (ref 1.9–3.7)
Glucose, Bld: 88 mg/dL (ref 65–99)
Potassium: 4.5 mmol/L (ref 3.5–5.3)
Sodium: 139 mmol/L (ref 135–146)
Total Bilirubin: 0.4 mg/dL (ref 0.2–1.2)
Total Protein: 7.2 g/dL (ref 6.1–8.1)
eGFR: 68 mL/min/1.73m2

## 2024-08-12 LAB — ANTI-DNA ANTIBODY, DOUBLE-STRANDED: ds DNA Ab: <1 [IU]/mL

## 2024-08-12 LAB — C3 AND C4
C3 Complement: 149 mg/dL (ref 83–193)
C4 Complement: 30 mg/dL (ref 15–57)

## 2024-08-12 LAB — SEDIMENTATION RATE: Sed Rate: 14 mm/h (ref 0–30)

## 2024-08-12 LAB — PROTEIN / CREATININE RATIO, URINE
Creatinine, Urine: 69 mg/dL (ref 20–275)
Protein/Creat Ratio: 87 mg/g{creat} (ref 24–184)
Protein/Creatinine Ratio: 0.087 mg/mg{creat} (ref 0.024–0.184)
Total Protein, Urine: 6 mg/dL (ref 5–24)

## 2024-08-12 NOTE — Progress Notes (Signed)
Double stranded DNA negative.

## 2024-09-28 ENCOUNTER — Ambulatory Visit: Admitting: Rheumatology

## 2024-10-24 ENCOUNTER — Encounter: Admitting: Obstetrics and Gynecology

## 2024-11-02 ENCOUNTER — Encounter: Admitting: Obstetrics and Gynecology
